# Patient Record
Sex: Female | Born: 1935
Health system: Southern US, Community
[De-identification: ages and names within clinical notes are randomized; demographics above are authoritative.]

## PROBLEM LIST (undated history)

## (undated) DIAGNOSIS — E039 Hypothyroidism, unspecified: Secondary | ICD-10-CM

## (undated) DIAGNOSIS — E785 Hyperlipidemia, unspecified: Secondary | ICD-10-CM

## (undated) DIAGNOSIS — N809 Endometriosis, unspecified: Secondary | ICD-10-CM

## (undated) DIAGNOSIS — K219 Gastro-esophageal reflux disease without esophagitis: Secondary | ICD-10-CM

## (undated) DIAGNOSIS — I10 Essential (primary) hypertension: Secondary | ICD-10-CM

## (undated) DIAGNOSIS — M1990 Unspecified osteoarthritis, unspecified site: Secondary | ICD-10-CM

## (undated) DIAGNOSIS — K635 Polyp of colon: Secondary | ICD-10-CM

## (undated) DIAGNOSIS — F419 Anxiety disorder, unspecified: Secondary | ICD-10-CM

## (undated) DIAGNOSIS — K5790 Diverticulosis of intestine, part unspecified, without perforation or abscess without bleeding: Secondary | ICD-10-CM

## (undated) DIAGNOSIS — T7840XA Allergy, unspecified, initial encounter: Secondary | ICD-10-CM

## (undated) DIAGNOSIS — M353 Polymyalgia rheumatica: Secondary | ICD-10-CM

## (undated) DIAGNOSIS — N6019 Diffuse cystic mastopathy of unspecified breast: Secondary | ICD-10-CM

## (undated) DIAGNOSIS — K589 Irritable bowel syndrome without diarrhea: Secondary | ICD-10-CM

## (undated) HISTORY — PX: ABDOMINAL HYSTERECTOMY: SHX81

## (undated) HISTORY — DX: Diverticulosis of intestine, part unspecified, without perforation or abscess without bleeding: K57.90

## (undated) HISTORY — PX: BREAST BIOPSY: SHX20

## (undated) HISTORY — DX: Hyperlipidemia, unspecified: E78.5

## (undated) HISTORY — PX: CATARACT EXTRACTION, BILATERAL: SHX1313

## (undated) HISTORY — DX: Hypothyroidism, unspecified: E03.9

## (undated) HISTORY — PX: LEFT COLECTOMY: SHX856

## (undated) HISTORY — DX: Allergy, unspecified, initial encounter: T78.40XA

## (undated) HISTORY — DX: Irritable bowel syndrome, unspecified: K58.9

## (undated) HISTORY — DX: Polyp of colon: K63.5

## (undated) HISTORY — DX: Essential (primary) hypertension: I10

## (undated) HISTORY — DX: Endometriosis, unspecified: N80.9

## (undated) HISTORY — DX: Gastro-esophageal reflux disease without esophagitis: K21.9

## (undated) HISTORY — DX: Anxiety disorder, unspecified: F41.9

## (undated) HISTORY — DX: Diffuse cystic mastopathy of unspecified breast: N60.19

## (undated) HISTORY — DX: Polymyalgia rheumatica: M35.3

## (undated) HISTORY — PX: APPENDECTOMY: SHX54

---

## 2000-06-18 ENCOUNTER — Encounter: Payer: Self-pay | Admitting: Internal Medicine

## 2000-06-18 ENCOUNTER — Encounter: Admission: RE | Admit: 2000-06-18 | Discharge: 2000-06-18 | Payer: Self-pay | Admitting: Internal Medicine

## 2000-06-28 ENCOUNTER — Encounter: Payer: Self-pay | Admitting: Internal Medicine

## 2000-06-28 ENCOUNTER — Encounter: Admission: RE | Admit: 2000-06-28 | Discharge: 2000-06-28 | Payer: Self-pay | Admitting: Internal Medicine

## 2000-07-03 ENCOUNTER — Encounter: Payer: Self-pay | Admitting: Internal Medicine

## 2000-07-03 ENCOUNTER — Encounter: Admission: RE | Admit: 2000-07-03 | Discharge: 2000-07-03 | Payer: Self-pay | Admitting: Internal Medicine

## 2000-07-09 ENCOUNTER — Encounter: Admission: RE | Admit: 2000-07-09 | Discharge: 2000-07-09 | Payer: Self-pay | Admitting: Internal Medicine

## 2000-07-09 ENCOUNTER — Encounter: Payer: Self-pay | Admitting: Internal Medicine

## 2000-07-24 ENCOUNTER — Other Ambulatory Visit: Admission: RE | Admit: 2000-07-24 | Discharge: 2000-07-24 | Payer: Self-pay | Admitting: Internal Medicine

## 2001-08-27 ENCOUNTER — Encounter: Admission: RE | Admit: 2001-08-27 | Discharge: 2001-08-27 | Payer: Self-pay | Admitting: Internal Medicine

## 2001-08-27 ENCOUNTER — Encounter: Payer: Self-pay | Admitting: Internal Medicine

## 2003-08-18 ENCOUNTER — Other Ambulatory Visit: Admission: RE | Admit: 2003-08-18 | Discharge: 2003-08-18 | Payer: Self-pay | Admitting: Internal Medicine

## 2003-12-08 ENCOUNTER — Encounter: Admission: RE | Admit: 2003-12-08 | Discharge: 2003-12-08 | Payer: Self-pay | Admitting: Internal Medicine

## 2004-08-30 ENCOUNTER — Encounter: Admission: RE | Admit: 2004-08-30 | Discharge: 2004-08-30 | Payer: Self-pay | Admitting: Internal Medicine

## 2005-06-21 ENCOUNTER — Encounter: Admission: RE | Admit: 2005-06-21 | Discharge: 2005-06-21 | Payer: Self-pay | Admitting: Internal Medicine

## 2005-11-07 ENCOUNTER — Other Ambulatory Visit: Admission: RE | Admit: 2005-11-07 | Discharge: 2005-11-07 | Payer: Self-pay | Admitting: Internal Medicine

## 2005-12-11 ENCOUNTER — Encounter: Admission: RE | Admit: 2005-12-11 | Discharge: 2005-12-11 | Payer: Self-pay | Admitting: Internal Medicine

## 2006-09-05 ENCOUNTER — Encounter: Admission: RE | Admit: 2006-09-05 | Discharge: 2006-09-05 | Payer: Self-pay | Admitting: Internal Medicine

## 2007-10-11 ENCOUNTER — Encounter: Admission: RE | Admit: 2007-10-11 | Discharge: 2007-10-11 | Payer: Self-pay | Admitting: Internal Medicine

## 2008-12-07 ENCOUNTER — Ambulatory Visit: Payer: Self-pay | Admitting: Internal Medicine

## 2009-01-19 ENCOUNTER — Ambulatory Visit: Payer: Self-pay | Admitting: Internal Medicine

## 2009-10-06 ENCOUNTER — Encounter: Admission: RE | Admit: 2009-10-06 | Discharge: 2009-10-06 | Payer: Self-pay | Admitting: Internal Medicine

## 2009-12-16 ENCOUNTER — Encounter (INDEPENDENT_AMBULATORY_CARE_PROVIDER_SITE_OTHER): Payer: Self-pay | Admitting: *Deleted

## 2010-01-03 ENCOUNTER — Encounter (INDEPENDENT_AMBULATORY_CARE_PROVIDER_SITE_OTHER): Payer: Self-pay

## 2010-01-04 ENCOUNTER — Ambulatory Visit: Payer: Self-pay | Admitting: Internal Medicine

## 2010-01-18 ENCOUNTER — Ambulatory Visit: Payer: Self-pay | Admitting: Internal Medicine

## 2010-01-20 ENCOUNTER — Encounter: Payer: Self-pay | Admitting: Internal Medicine

## 2010-02-08 ENCOUNTER — Ambulatory Visit: Payer: Self-pay | Admitting: Internal Medicine

## 2010-10-31 ENCOUNTER — Other Ambulatory Visit: Payer: Self-pay | Admitting: Internal Medicine

## 2010-10-31 DIAGNOSIS — Z1239 Encounter for other screening for malignant neoplasm of breast: Secondary | ICD-10-CM

## 2010-11-01 NOTE — Letter (Signed)
Summary: Previsit letter  [facility]  [address] [place], [place] [address]   Phone: [phone]  Fax: [phone]       12/16/2009 MRN: [id number]  [facility] [address] [place], [place]  [address]  Dear Ms. Storey,  Welcome to the Gastroenterology Division at [facility].    You are scheduled to see a nurse for your pre-procedure visit on January 04, 2010 at 11:00am on the 3rd floor at [facility], [address]  We ask that you try to arrive at our office 15 minutes prior to your appointment time to allow for check-in.  Your nurse visit will consist of discussing your medical and surgical history, your immediate family medical history, and your medications.    Please bring a complete list of all your medications or, if you prefer, bring the medication bottles and we will list them.  We will need to be aware of both prescribed and over the counter drugs.  We will need to know exact dosage information as well.  If you are on blood thinners (Coumadin, Plavix, Aggrenox, Ticlid, etc.) please call our office today/prior to your appointment, as we need to consult with your physician about holding your medication.   Please be prepared to read and sign documents such as consent forms, a financial agreement, and acknowledgement forms.  If necessary, and with your consent, a friend or relative is welcome to sit-in on the nurse visit with you.  Please bring your insurance card so that we may make a copy of it.  If your insurance requires a referral to see a specialist, please bring your referral form from your primary care physician.  No co-pay is required for this nurse visit.     If you cannot keep your appointment, please call [phone] to cancel or reschedule prior to your appointment date.  This allows [place] the opportunity to schedule an appointment for another patient in need of care.    Thank you for choosing [facility] for your medical  needs.  We appreciate the opportunity to care for you.  Please visit [place] at our website  to learn more about our practice.                     Sincerely.                                                                                                                   The Gastroenterology Division

## 2010-11-01 NOTE — Procedures (Signed)
Summary: Colonoscopy  Patient: Dominique Livingston Note: All result statuses are Final unless otherwise noted.  Tests: (1) Colonoscopy (COL)   COL Colonoscopy           DONE     [facility]     [address]     [place], [place]  [address]           COLONOSCOPY PROCEDURE REPORT           PATIENT:  Dominique, Livingston  MR#:  [id number]     BIRTHDATE:  [birth year], 76 yrs. old  GENDER:  female     ENDOSCOPIST:  Hedwig Morton. Juanda Chance, MD     REF. BY:  Sharlet Salina, M.D.     PROCEDURE DATE:  01/18/2010     PROCEDURE:  Colonoscopy 19147     ASA CLASS:  Class I     INDICATIONS:  Routine Risk Screening sigmoid resection 30 years     ago for severe endometriosis     MEDICATIONS:   Versed 7 mg, Fentanyl 50 mcg           DESCRIPTION OF PROCEDURE:   After the risks benefits and     alternatives of the procedure were thoroughly explained, informed     consent was obtained.  Digital rectal exam was performed and     revealed no rectal masses.   The LB CF-[id number] endoscope     was introduced through the anus and advanced to the cecum, which     was identified by both the appendix and ileocecal valve, without     limitations.  The quality of the prep was good, using MiraLax.     The instrument was then slowly withdrawn as the colon was fully     examined.     <<PROCEDUREIMAGES>>           FINDINGS:  Three polyps were found in the sigmoid colon. at 20 cm     3 diminutive polyps Polyp was snared without cautery. Retrieval     was successful. snare polyp The polyps were removed using cold     biopsy forceps (see image4, image5, image6, image7, and image8).     Mild diverticulosis was found in the sigmoid colon.  This was     otherwise a normal examination of the colon (see image1, image2,     image3, and image9).   Retroflexed views in the rectum revealed no     abnormalities.    The scope was then withdrawn from the patient     and the procedure completed.           COMPLICATIONS:   None     ENDOSCOPIC IMPRESSION:     1) Three polyps in the sigmoid colon     2) Mild diverticulosis in the sigmoid colon     3) Otherwise normal examination     RECOMMENDATIONS:     1) Await pathology results     2) High fiber diet.     REPEAT EXAM:  In 5 - 7 year(s) for.           ______________________________     Hedwig Morton. Juanda Chance, MD           CC:           n.     eSIGNED:   Hedwig Morton. Brodie at 01/18/2010 10:55 AM           Aileen Pilot, [id number]  Note: An  exclamation mark (!) indicates a result that was not dispersed into the flowsheet. Document Creation Date: 01/18/2010 10:56 AM _______________________________________________________________________  (1) Order result status: Final Collection or observation date-time: 01/18/2010 10:45 Requested date-time:  Receipt date-time:  Reported date-time:  Referring Physician:   Ordering Physician: Lina Sar [phone]) Specimen Source:  Source: Launa Grill Order Number: [id number] Lab site:   Appended Document: Colonoscopy     Procedures Next Due Date:    Colonoscopy: 01/2020

## 2010-11-01 NOTE — Miscellaneous (Signed)
Summary: Lec previsit  Clinical Lists Changes  Medications: Added new medication of MIRALAX   POWD (POLYETHYLENE GLYCOL 3350) As per prep  instructions. - Signed Added new medication of REGLAN 10 MG  TABS (METOCLOPRAMIDE HCL) As per prep instructions. - Signed Added new medication of DULCOLAX 5 MG  TBEC (BISACODYL) Day before procedure take 2 at 3pm and 2 at 8pm. - Signed Rx of MIRALAX   POWD (POLYETHYLENE GLYCOL 3350) As per prep  instructions.;  #255gm x 0;  Signed;  Entered by: Ulis Rias RN;  Authorized by: Hart Carwin MD;  Method used: Electronically to [facility] [address] [place], [place], [place]  [address], Ph: [phone], Fax: [phone] Rx of REGLAN 10 MG  TABS (METOCLOPRAMIDE HCL) As per prep instructions.;  #2 x 0;  Signed;  Entered by: Ulis Rias RN;  Authorized by: Hart Carwin MD;  Method used: Electronically to [facility] [address], [place], [place], [place]  [address], Ph: [phone], Fax: [phone] Rx of DULCOLAX 5 MG  TBEC (BISACODYL) Day before procedure take 2 at 3pm and 2 at 8pm.;  #4 x 0;  Signed;  Entered by: Ulis Rias RN;  Authorized by: Hart Carwin MD;  Method used: Electronically to [facility] [address] [place], [place], [place]  [address], Ph: [phone], Fax: [phone] Allergies: Added new allergy or adverse reaction of * ERTHROMYCIN Observations: Added new observation of NKA: F (01/04/2010 10:36)    Prescriptions: DULCOLAX 5 MG  TBEC (BISACODYL) Day before procedure take 2 at 3pm and 2 at 8pm.  #4 x 0   Entered by:   Ulis Rias RN   Authorized by:   Hart Carwin MD   Signed by:   Ulis Rias RN on 01/04/2010   Method used:   Electronically to        [facility] (retail)       [address]       [place], [place]  [address]       Ph: [phone]       Fax: [phone]   RxID:   [id number] REGLAN 10 MG  TABS (METOCLOPRAMIDE HCL) As  per prep instructions.  #2 x 0   Entered by:   Ulis Rias RN   Authorized by:   Hart Carwin MD   Signed by:   Ulis Rias RN on 01/04/2010   Method used:   Electronically to        [facility] (retail)       [address]       [place], [place]  [address]       Ph: [phone]       Fax: [phone]   RxID:   [id number] MIRALAX   POWD (POLYETHYLENE GLYCOL 3350) As per prep  instructions.  #255gm x 0   Entered by:   Ulis Rias RN   Authorized by:   Hart Carwin MD   Signed by:   Ulis Rias RN on 01/04/2010   Method used:   Electronically to        [facility] (retail)       [address]       [place], [place]  [address]       Ph:  [phone]       Fax: [phone]   RxID:   [id number]

## 2010-11-01 NOTE — Letter (Signed)
Summary: Patient Notice- Polyp Results  [facility]  [address] [place], [place] [address]   Phone: [phone]  Fax: [phone]        January 20, 2010 MRN: [id number]    [facility] [address] [place], [place]  [address]    Dear Ms. Hor,  I am pleased to inform you that the colon polyp(s) removed during your recent colonoscopy was (were) found to be benign (no cancer detected) upon pathologic examination.The polyps were hyperplastic, ( not precancerous)  I recommend you have a repeat colonoscopy examination in  10_ years to look for recurrent polyps, as having colon polyps increases your risk for having recurrent polyps or even colon cancer in the future.  Should you develop new or worsening symptoms of abdominal pain, bowel habit changes or bleeding from the rectum or bowels, please schedule an evaluation with either your primary care physician or with me.  Additional information/recommendations:  _x_ No further action with gastroenterology is needed at this time. Please      follow-up with your primary care physician for your other healthcare      needs.  __ Please call [phone] to schedule a return visit to review your      situation.  __ Please keep your follow-up visit as already scheduled.  __ Continue treatment plan as outlined the day of your exam.  Please call us if you are having persistent problems or have questions about your condition that have not been fully answered at this time.  Sincerely,  Hart Carwin MD  This letter has been electronically signed by your physician.  Appended Document: Patient Notice- Polyp Results letter mailed 4.25.11

## 2010-11-01 NOTE — Letter (Signed)
Summary: [facility] Instructions  [facility]  [address] [place], [place] [address]   Phone: [phone]  Fax: [phone]       Dominique Livingston    1933/04/09    MRN: [id number]       Procedure Day /Date: 01/18/10  Tuesday     Arrival Time:  9:00am     Procedure Time: 10:00am     Location of Procedure:                    _x _  [facility] (4th Floor)   PREPARATION FOR COLONOSCOPY WITH MIRALAX  Starting 5 days prior to your procedure _5:00am _ do not eat nuts, seeds, popcorn, corn, beans, peas,  salads, or any raw vegetables.  Do not take any fiber supplements (e.g. Metamucil, Citrucel, and Benefiber). ____________________________________________________________________________________________________   THE DAY BEFORE YOUR PROCEDURE         DATE:   01/17/10 DAY:  Monday  1   Drink clear liquids the entire day-NO SOLID FOOD  2   Do not drink anything colored red or purple.  Avoid juices with pulp.  No orange juice.  3   Drink at least 64 oz. (8 glasses) of fluid/clear liquids during the day to prevent dehydration and help the prep work efficiently.  CLEAR LIQUIDS INCLUDE: Water Jello Ice Popsicles Tea (sugar ok, no milk/cream) Powdered fruit flavored drinks Coffee (sugar ok, no milk/cream) Gatorade Juice: apple, white grape, white cranberry  Lemonade Clear bullion, consomm, broth Carbonated beverages (any kind) Strained chicken noodle soup Hard Candy  4   Mix the entire bottle of Miralax with 64 oz. of Gatorade/Powerade in the morning and put in the refrigerator to chill.  5   At 3:00 pm take 2 Dulcolax/Bisacodyl tablets.  6   At 4:30 pm take one Reglan/Metoclopramide tablet.  7  Starting at 5:00 pm drink one 8 oz glass of the Miralax mixture every 15-20 minutes until you have finished drinking the entire 64 oz.  You should finish drinking prep around 7:30 or 8:00 pm.  8   If you are nauseated, you may take the 2nd Reglan/Metoclopramide  tablet at 6:30 pm.        9    At 8:00 pm take 2 more DULCOLAX/Bisacodyl tablets.     THE DAY OF YOUR PROCEDURE      DATE:   01/18/10  DAY:  Tuesday  You may drink clear liquids until   8:00am  (2 HOURS BEFORE PROCEDURE).   MEDICATION INSTRUCTIONS  Unless otherwise instructed, you should take regular prescription medications with a small sip of water as early as possible the morning of your procedure.         OTHER INSTRUCTIONS  You will need a responsible adult at least 75 years of age to accompany you and drive you home.   This person must remain in the waiting room during your procedure.  Wear loose fitting clothing that is easily removed.  Leave jewelry and other valuables at home.  However, you may wish to bring a book to read or an iPod/MP3 player to listen to music as you wait for your procedure to start.  Remove all body piercing jewelry and leave at home.  Total time from sign-in until discharge is approximately 2-3 hours.  You should go home directly after your procedure and rest.  You can resume normal activities the day after your procedure.  The day of your procedure you should  not:   Drive   Make legal decisions   Operate machinery   Drink alcohol   Return to work  You will receive specific instructions about eating, activities and medications before you leave.   The above instructions have been reviewed and explained to me by   Ulis Rias RN  January 04, 2010 11:30 AM     I fully understand and can verbalize these instructions _____________________________ Date _______

## 2010-11-03 ENCOUNTER — Ambulatory Visit
Admission: RE | Admit: 2010-11-03 | Discharge: 2010-11-03 | Disposition: A | Discharge status: Home / Self Care | Payer: Medicare Other | Source: Ambulatory Visit | Attending: Internal Medicine | Admitting: Internal Medicine

## 2010-11-03 DIAGNOSIS — Z1239 Encounter for other screening for malignant neoplasm of breast: Secondary | ICD-10-CM

## 2011-02-06 ENCOUNTER — Encounter: Payer: Medicare Other | Admitting: Internal Medicine

## 2011-02-06 DIAGNOSIS — I10 Essential (primary) hypertension: Secondary | ICD-10-CM

## 2011-02-06 DIAGNOSIS — F411 Generalized anxiety disorder: Secondary | ICD-10-CM

## 2011-02-06 DIAGNOSIS — E039 Hypothyroidism, unspecified: Secondary | ICD-10-CM

## 2011-02-06 DIAGNOSIS — E785 Hyperlipidemia, unspecified: Secondary | ICD-10-CM

## 2011-10-30 ENCOUNTER — Other Ambulatory Visit: Payer: Self-pay | Admitting: Internal Medicine

## 2012-01-08 ENCOUNTER — Other Ambulatory Visit: Payer: Self-pay | Admitting: Internal Medicine

## 2012-01-08 DIAGNOSIS — Z1231 Encounter for screening mammogram for malignant neoplasm of breast: Secondary | ICD-10-CM

## 2012-01-11 ENCOUNTER — Ambulatory Visit
Admission: RE | Admit: 2012-01-11 | Discharge: 2012-01-11 | Disposition: A | Discharge status: Home / Self Care | Payer: Medicare Other | Source: Ambulatory Visit | Attending: Internal Medicine | Admitting: Internal Medicine

## 2012-01-11 DIAGNOSIS — Z1231 Encounter for screening mammogram for malignant neoplasm of breast: Secondary | ICD-10-CM | POA: Diagnosis not present

## 2012-02-13 ENCOUNTER — Other Ambulatory Visit: Payer: Self-pay | Admitting: Internal Medicine

## 2012-02-13 NOTE — Telephone Encounter (Signed)
?  time for OV?

## 2012-03-08 ENCOUNTER — Encounter: Payer: Self-pay | Admitting: Internal Medicine

## 2012-03-08 ENCOUNTER — Ambulatory Visit (INDEPENDENT_AMBULATORY_CARE_PROVIDER_SITE_OTHER): Payer: Medicare Other | Admitting: Internal Medicine

## 2012-03-08 VITALS — BP 136/76 | HR 76 | Temp 97.7°F | Ht 62.25 in | Wt 146.0 lb

## 2012-03-08 DIAGNOSIS — I10 Essential (primary) hypertension: Secondary | ICD-10-CM | POA: Insufficient documentation

## 2012-03-08 DIAGNOSIS — Z780 Asymptomatic menopausal state: Secondary | ICD-10-CM

## 2012-03-08 DIAGNOSIS — Z23 Encounter for immunization: Secondary | ICD-10-CM

## 2012-03-08 DIAGNOSIS — M810 Age-related osteoporosis without current pathological fracture: Secondary | ICD-10-CM | POA: Diagnosis not present

## 2012-03-08 DIAGNOSIS — E039 Hypothyroidism, unspecified: Secondary | ICD-10-CM | POA: Diagnosis not present

## 2012-03-08 DIAGNOSIS — M8580 Other specified disorders of bone density and structure, unspecified site: Secondary | ICD-10-CM

## 2012-03-08 DIAGNOSIS — Z0000 Encounter for general adult medical examination without abnormal findings: Secondary | ICD-10-CM

## 2012-03-08 DIAGNOSIS — M353 Polymyalgia rheumatica: Secondary | ICD-10-CM

## 2012-03-08 DIAGNOSIS — E785 Hyperlipidemia, unspecified: Secondary | ICD-10-CM | POA: Diagnosis not present

## 2012-03-08 DIAGNOSIS — M949 Disorder of cartilage, unspecified: Secondary | ICD-10-CM

## 2012-03-08 LAB — LIPID PANEL
Cholesterol: 219 mg/dL — ABNORMAL HIGH (ref 0–200)
HDL: 58 mg/dL (ref >39)
Total CHOL/HDL Ratio: 3.8 Ratio
Triglycerides: 188 mg/dL — ABNORMAL HIGH (ref <150)
VLDL: 38 mg/dL (ref 0–40)

## 2012-03-08 LAB — COMPREHENSIVE METABOLIC PANEL
Albumin: 4.8 g/dL (ref 3.5–5.2)
BUN: 18 mg/dL (ref 6–23)
Chloride: 102 mEq/L (ref 96–112)
Glucose, Bld: 90 mg/dL (ref 70–99)
Potassium: 4.1 mEq/L (ref 3.5–5.3)
Total Protein: 7.6 g/dL (ref 6.0–8.3)

## 2012-03-08 LAB — CBC WITH DIFFERENTIAL/PLATELET
Basophils Relative: 1 % (ref 0–1)
Eosinophils Absolute: 0.1 10*3/uL (ref 0.0–0.7)
Eosinophils Relative: 2 % (ref 0–5)
Lymphocytes Relative: 31 % (ref 12–46)
MCHC: 33.2 g/dL (ref 30.0–36.0)
MCV: 88.1 fL (ref 78.0–100.0)
Neutro Abs: 3.4 10*3/uL (ref 1.7–7.7)
RDW: 13.8 % (ref 11.5–15.5)
WBC: 5.6 10*3/uL (ref 4.0–10.5)

## 2012-03-08 MED ORDER — LISINOPRIL 5 MG PO TABS: 5.0000 mg | ORAL_TABLET | Freq: Every day | ORAL | Status: DC

## 2012-03-08 MED ORDER — EZETIMIBE 10 MG PO TABS: 10.0000 mg | ORAL_TABLET | Freq: Every day | ORAL | Status: DC

## 2012-03-08 MED ORDER — LEVOTHYROXINE SODIUM 75 MCG PO TABS: 75.0000 ug | ORAL_TABLET | Freq: Every day | ORAL | Status: DC

## 2012-03-08 NOTE — Patient Instructions (Signed)
Continue same medications.  Return in one year.

## 2012-03-08 NOTE — Progress Notes (Signed)
Subjective:    [name], female    DOB: 07/28/1933, 78 y.o.   MRN: [id number]  HPI 78 year old white female with history of allergic rhinitis, hypothyroidism diagnosed 1995, hyperlipidemia diagnosed 1998, anxiety, polymyalgia rheumatica diagnosed in 2001, GE reflux, hypertension diagnosed in 2001, osteopenia, fibrocystic breast disease in today for health maintenance and evaluation of multiple medical problems. She is intolerant of Fosamax it caused an adverse reaction. Augmentin causes nausea.  Had Zostavax vaccine may 2011, colonoscopy April 2011. Takes calcium and vitamin D supplementation. Could not tolerate statin medication but takes city of for hyperlipidemia. Has been followed by rheumatologist for polymyalgia rheumatica which is improved over the years. For some time she took prednisone but this is been discontinued.  2 surgeries for endometriosis in the remote past. One surgery for endometriosis involved left salpingo-oophorectomy. At that time there was obstruction of the sigmoid colon which resulted in a left colectomy performed by Dr. Rolene Course in 1969. Patient subsequently had a hysterectomy with a right salpingo-oophorectomy performed by Dr. Gery Pray. She also had an ovarian cyst drained in the 1960s. Had an excisional biopsy of the right breast revealing fibrocystic breast disease April 1979. Cataract extraction February 2006 of the right eye and in April 2006 of the left eye.  Family history: Father died at age 27 with history of pneumonia, COPD and heart problems. Mother died at age 80 of cancer that was metastatic but primary was unknown. 2 brothers with history of hypertension. 2 adult children a son and a daughter. Daughter has hypertension.  Social history: Nonsmoker does not consume alcohol. Husband is retired Company secretary. She is a Futures trader. Husband has had some medical issues that have been stressful.    Review of Systems  Constitutional: Negative.   HENT:  Negative.   Eyes:       Vision not as good as it used to be  Respiratory: Negative.   Cardiovascular: Negative.   Genitourinary: Negative.   Musculoskeletal: Positive for arthralgias.  Neurological: Negative.   Hematological: Negative.   Psychiatric/Behavioral: Positive for dysphoric mood.       Objective:   Physical Exam  Vitals reviewed. Constitutional: She is oriented to person, place, and time. She appears well-developed and well-nourished. No distress.  HENT:  Head: Normocephalic and atraumatic.  Right Ear: External ear normal.  Left Ear: External ear normal.  Nose: Nose normal.  Mouth/Throat: Oropharynx is clear and moist. No oropharyngeal exudate.  Eyes: Conjunctivae and EOM are normal. Pupils are equal, round, and reactive to light. Right eye exhibits no discharge. Left eye exhibits no discharge. No scleral icterus.  Neck: Normal range of motion. Neck supple. No JVD present. No thyromegaly present.  Cardiovascular: Normal rate, regular rhythm, normal heart sounds and intact distal pulses.   No murmur heard. Pulmonary/Chest: Effort normal and breath sounds normal. No respiratory distress. She has no wheezes. She has no rales. She exhibits no tenderness.       Breasts normal female  Abdominal: Soft. Bowel sounds are normal. She exhibits no distension and no mass. There is no tenderness. There is no rebound and no guarding.  Genitourinary:       deferrred  Musculoskeletal: Normal range of motion. She exhibits no edema and no tenderness.  Lymphadenopathy:    She has no cervical adenopathy.  Neurological: She is alert and oriented to person, place, and time. She has normal reflexes. She displays normal reflexes. No cranial nerve deficit.  Skin: Skin is warm and dry. No rash noted.  She is not diaphoretic.  Psychiatric: She has a normal mood and affect. Her behavior is normal. Judgment and thought content normal.   Subjective:   Patient presents for Medicare Annual/Subsequent  preventive examination.   Review Past Medical/Family/Social: See above   Risk Factors  Current exercise habits: Sedentary Dietary issues discussed: Low-fat low-carb  Cardiac risk factors: Hyperlipidemia and hypertension  Depression Screen  (Note: if answer to either of the following is "Yes", a more complete depression screening is indicated)  Over the past two weeks, have you felt down, depressed or hopeless? Down but not hopeless Over the past two weeks, have you felt little interest or pleasure in doing things? Yes  Have you lost interest or pleasure in daily life? Yes  Do you often feel hopeless? Yes  Do you cry easily over simple problems? No   Activities of Daily Living  In your present state of health, do you have any difficulty performing the following activities?:  Driving? No  Managing money? No  Feeding yourself? No  Getting from bed to chair? No  Climbing a flight of stairs? No  Preparing food and eating?: No  Bathing or showering? No  Getting dressed: No  Getting to the toilet? No  Using the toilet:No  Moving around from place to place: No  In the past year have you fallen or had a near fall?:No  Are you sexually active? No  Do you have more than one partner? No   Hearing Difficulties: No  Do you often ask people to speak up or repeat themselves? No  Do you experience ringing or noises in your ears? Yes  Do you have difficulty understanding soft or whispered voices? No  Do you feel that you have a problem with memory? Yes  Do you often misplace items? No  Do you feel safe at home? Yes   Cognitive Testing  Alert? Yes Normal Appearance?Yes  Oriented to person? Yes Place? Yes  Time? Yes  Recall of three objects? Yes  Can perform simple calculations? Yes  Displays appropriate judgment?Yes  Can read the correct time from a watch face?Yes   List the Names of Other Physician/Practitioners you currently use: Rheumatologist   Indicate any recent Medical  Services you may have received from other than [facility] providers in the past year (date may be approximate).   Screening Tests / Date Colonoscopy April 2011                    Zostavax May 2011 Mammogram January 2011 Influenza Vaccine yearly Tetanus/tdap 03/08/2012    Objective:       General appearance: Appears stated age  Head: Normocephalic, without obvious abnormality, atraumatic  Eyes: conj clear, EOMi PEERLA  Ears: normal TM's and external ear canals both ears  Nose: Nares normal. Septum midline. Mucosa normal. No drainage or sinus tenderness.  Throat: lips, mucosa, and tongue normal; teeth and gums normal  Neck: no adenopathy, no carotid bruit, no JVD, supple, symmetrical, trachea midline and thyroid not enlarged, symmetric, no tenderness/mass/nodules  No CVA tenderness.  Lungs: clear to auscultation bilaterally  Breasts: normal appearance, no masses or tenderness,  Heart: regular rate and rhythm, S1, S2 normal, no murmur, click, rub or gallop  Abdomen: soft, non-tender; bowel sounds normal; no masses, no organomegaly  Musculoskeletal: ROM normal in all joints, no crepitus, no deformity, Normal muscle strengthen. Back  is symmetric, no curvature. Skin: Skin color, texture, turgor normal. No rashes or lesions  Lymph nodes: Cervical,  supraclavicular, and axillary nodes normal.  Neurologic: CN 2 -12 Normal, Normal symmetric reflexes. Normal coordination and gait  Psych: Alert & Oriented x 3, Mood appear stable.    Assessment:    Annual wellness medicare exam   Plan:    During the course of the visit the patient was educated and counseled about appropriate screening and preventive services including:  Screening mammography  Colorectal cancer screening  Shingles vaccine. Prescription given to that she can get the vaccine at the pharmacy or Medicare part D.  Screen + for depression. PHQ- 9 score of 12 (moderate depression). We discussed the options of counseling versus  possibly a medication. I encouraged her strongly think about the counseling. She is going through some medical problems currently and her husband is as well Mrs. been very stressful for her. She says she will think about it. She does have Xanax to use as needed. Though she may benefit from an SSRI for her more depressive type symptoms but she wants to hold off at this time.  I aksed her to please have her cardioloist send records since we have none on file.  Diet review for nutrition referral? Yes ____ Not Indicated __x__  Patient Instructions (the written plan) was given to the patient.  Medicare Attestation  I have personally reviewed:  The patient's medical and social history  Their use of alcohol, tobacco or illicit drugs  Their current medications and supplements  The patient's functional ability including ADLs,fall risks, home safety risks, cognitive, and hearing and visual impairment  Diet and physical activities  Evidence for depression or mood disorders  The patient's weight, height, BMI, and visual acuity have been recorded in the chart. I have made referrals, counseling, and provided education to the patient based on review of the above and I have provided the patient with a written personalized care plan for preventive services.            Assessment & Plan:  Hypertension  Hyperlipidemia  Osteopenia  History of polymyalgia rheumatica  Hypothyroidism  Allergic rhinitis  Fibrocystic breast disease  Anxiety  Plan: Recommend annual mammogram. Rheumatologist usually has patient get bone density study. Colonoscopy done April 2011. Immunizations are now up-to-date. Return in 6 months for office visit, lipid panel on Zetia, TSH and blood pressure check. No change in medications  Spent 25 minutes reviewing medical problems with patient in talking with her about her current situation, life stress with anxiety over husband's medical problems. Refills given.

## 2012-03-09 LAB — VITAMIN D 25 HYDROXY (VIT D DEFICIENCY, FRACTURES): Vit D, 25-Hydroxy: 46 ng/mL (ref 30–89)

## 2013-02-06 ENCOUNTER — Encounter: Payer: Self-pay | Admitting: Internal Medicine

## 2013-02-06 ENCOUNTER — Ambulatory Visit (INDEPENDENT_AMBULATORY_CARE_PROVIDER_SITE_OTHER): Payer: Medicare Other | Admitting: Internal Medicine

## 2013-02-06 VITALS — BP 148/78 | HR 80 | Temp 99.6°F | Wt 151.0 lb

## 2013-02-06 DIAGNOSIS — N390 Urinary tract infection, site not specified: Secondary | ICD-10-CM

## 2013-02-06 DIAGNOSIS — R300 Dysuria: Secondary | ICD-10-CM

## 2013-02-06 LAB — POCT URINALYSIS DIPSTICK: Bilirubin, UA: NEGATIVE

## 2013-02-06 NOTE — Progress Notes (Signed)
  Subjective:    [name], female    DOB: 1933/05/07, 79 y.o.   MRN: [id number]  HPI  Onset this past weekend of urinary symptoms of dysuria and pressure. No occult blood in urine. Some chills for 4 hours 2 days ago. Urine has been cloudy and has odor. No documented fever. No nausea and vomiting. Patient is worried that she could have contracted this infection from her husband. He apparently has had a recent urinary infection. Explained to her that this was highly unlikely.    Review of Systems     Objective:   Physical Exam no CVA tenderness. Dipstick UA is abnormal. See results. Culture taken.        Assessment & Plan:  Urinary tract infection  Plan: Cipro  twice daily for 7 days   Addendum: Urine culture greater than 100,000col/ml Proteus mirabilis sensitive to Cipro

## 2013-02-09 LAB — URINE CULTURE

## 2013-02-10 NOTE — Progress Notes (Signed)
Patient informed. 

## 2013-02-19 ENCOUNTER — Other Ambulatory Visit: Payer: Self-pay

## 2013-02-19 DIAGNOSIS — Z1231 Encounter for screening mammogram for malignant neoplasm of breast: Secondary | ICD-10-CM

## 2013-02-20 ENCOUNTER — Other Ambulatory Visit (INDEPENDENT_AMBULATORY_CARE_PROVIDER_SITE_OTHER): Payer: Medicare Other | Admitting: Internal Medicine

## 2013-02-20 VITALS — Temp 98.7°F

## 2013-02-20 DIAGNOSIS — N390 Urinary tract infection, site not specified: Secondary | ICD-10-CM | POA: Diagnosis not present

## 2013-02-20 LAB — POCT URINALYSIS DIPSTICK
Bilirubin, UA: NEGATIVE
Blood, UA: NEGATIVE
Glucose, UA: NEGATIVE
Leukocytes, UA: NEGATIVE
Protein, UA: NEGATIVE
Spec Grav, UA: 1.01
Urobilinogen, UA: NEGATIVE

## 2013-02-20 NOTE — Progress Notes (Signed)
Patient presents today for a recheck of her UTI after finishing antibiotics. She is asymptomatic. Urine is clear.

## 2013-02-27 ENCOUNTER — Ambulatory Visit
Admission: RE | Admit: 2013-02-27 | Discharge: 2013-02-27 | Disposition: A | Discharge status: Home / Self Care | Payer: Medicare Other | Source: Ambulatory Visit

## 2013-02-27 DIAGNOSIS — Z1231 Encounter for screening mammogram for malignant neoplasm of breast: Secondary | ICD-10-CM

## 2013-03-02 NOTE — Patient Instructions (Addendum)
Take Cipro twice daily as directed. Return in 2 weeks for repeat urinalysis.

## 2013-03-11 ENCOUNTER — Ambulatory Visit (INDEPENDENT_AMBULATORY_CARE_PROVIDER_SITE_OTHER): Payer: Medicare Other | Admitting: Internal Medicine

## 2013-03-11 ENCOUNTER — Other Ambulatory Visit: Payer: Self-pay

## 2013-03-11 ENCOUNTER — Other Ambulatory Visit: Payer: Self-pay | Admitting: Internal Medicine

## 2013-03-11 ENCOUNTER — Encounter: Payer: Self-pay | Admitting: Internal Medicine

## 2013-03-11 VITALS — BP 150/70 | HR 72 | Temp 98.1°F | Wt 150.0 lb

## 2013-03-11 DIAGNOSIS — I10 Essential (primary) hypertension: Secondary | ICD-10-CM | POA: Diagnosis not present

## 2013-03-11 DIAGNOSIS — J309 Allergic rhinitis, unspecified: Secondary | ICD-10-CM

## 2013-03-11 DIAGNOSIS — E785 Hyperlipidemia, unspecified: Secondary | ICD-10-CM | POA: Diagnosis not present

## 2013-03-11 DIAGNOSIS — E038 Other specified hypothyroidism: Secondary | ICD-10-CM

## 2013-03-11 DIAGNOSIS — K219 Gastro-esophageal reflux disease without esophagitis: Secondary | ICD-10-CM

## 2013-03-11 DIAGNOSIS — Z0000 Encounter for general adult medical examination without abnormal findings: Secondary | ICD-10-CM

## 2013-03-11 DIAGNOSIS — Z8739 Personal history of other diseases of the musculoskeletal system and connective tissue: Secondary | ICD-10-CM

## 2013-03-11 DIAGNOSIS — E039 Hypothyroidism, unspecified: Secondary | ICD-10-CM | POA: Diagnosis not present

## 2013-03-11 DIAGNOSIS — E559 Vitamin D deficiency, unspecified: Secondary | ICD-10-CM

## 2013-03-11 LAB — CBC WITH DIFFERENTIAL/PLATELET
Basophils Relative: 1 % (ref 0–1)
Eosinophils Absolute: 0.1 10*3/uL (ref 0.0–0.7)
Eosinophils Relative: 2 % (ref 0–5)
Lymphs Abs: 1.9 10*3/uL (ref 0.7–4.0)
MCH: 29.2 pg (ref 26.0–34.0)
MCHC: 33 g/dL (ref 30.0–36.0)
MCV: 88.5 fL (ref 78.0–100.0)
Neutrophils Relative %: 62 % (ref 43–77)
Platelets: 232 10*3/uL (ref 150–400)
RDW: 14.3 % (ref 11.5–15.5)

## 2013-03-11 MED ORDER — LISINOPRIL 5 MG PO TABS: 5.0000 mg | ORAL_TABLET | Freq: Every day | ORAL | Status: DC

## 2013-03-11 MED ORDER — EZETIMIBE 10 MG PO TABS: 10.0000 mg | ORAL_TABLET | Freq: Every day | ORAL | Status: DC

## 2013-03-11 MED ORDER — LEVOTHYROXINE SODIUM 75 MCG PO TABS: 75.0000 ug | ORAL_TABLET | Freq: Every day | ORAL | Status: DC

## 2013-03-11 NOTE — Progress Notes (Signed)
Subjective:    [name], female    DOB: May 16, 1933, 79 y.o.   MRN: [id number]  HPI  79 year old White female for health maintenance and evaluation of medical problems. Has not had antihypertensive med this am. BP is 150/70. History of anxiety and PMR.  Has not had to see rheumatologist in about 3 years. It has been about 5 years since she has seen opthalmologist. Mammogram has been done. Has arthritis in fingers. Does a lot of knitting.   History of allergic rhinitis, hypothyroidism diagnosed in 1995, hyperlipidemia diagnosed in 1998, anxiety, polymyalgia rheumatica diagnosed in 2001, history of GE reflux, hypertension diagnosed in 2001, osteopenia, fibrocystic breast disease.  She is intolerant of Fosamax. It causes an adverse reaction. Augmentin causes nausea. She cannot tolerate statin medication but takes Zetia for hyperlipidemia.  Zostavax vaccine done in 2011, colonoscopy 2011.  Past medical history: 2 surgeries for endometriosis in the remote past. 1 surgery for endometriosis involved a left salpingo-oophorectomy. It that time there was obstruction of the sigmoid colon which resulted in a left colectomy performed by Dr. Rolene Course in 1969. Patient subsequently had a hysterectomy with right salpingo-oophorectomy performed by Dr. Allyson Sabal. She also had an ovarian cyst drained in the 1960s. Had an excisional biopsy of the right breast showed fibrocystic breast disease 1979. Cataract extraction of the right eye February 2006 and of the left eye in April 2006.  Social history: She is married. Nonsmoker. Does not consume alcohol. She is a Futures trader. Husband is retired Company secretary and has had some medical issues that are stressful. 2 adult children, a son and a daughter.  Family history: Father died at age 39 with history of pneumonia, COPD, heart problems. Mother died at age 40 of cancer that was metastatic or primary unknown to patient. 2 brothers with history of hypertension.  Daughter  has hypertension.  Treated in May for urinary tract infection which resolved with Cipro.              Review of Systems  Constitutional: Negative.   HENT: Positive for rhinorrhea.   Eyes: Negative.   Respiratory: Negative.   Endocrine:       History of hypothyroidism  Genitourinary: Positive for frequency.       Recent UTI  Allergic/Immunologic: Positive for environmental allergies.       History of hives treated with Zyrtec  Neurological: Negative.   Hematological: Negative.   Psychiatric/Behavioral:       Mild anxiety       Objective:   Physical Exam  Vitals reviewed. Constitutional: She is oriented to person, place, and time. She appears well-developed and well-nourished. No distress.  HENT:  Head: Normocephalic and atraumatic.  Right Ear: External ear normal.  Left Ear: External ear normal.  Mouth/Throat: Oropharynx is clear and moist. No oropharyngeal exudate.  Eyes: Conjunctivae and EOM are normal. Pupils are equal, round, and reactive to light. Right eye exhibits no discharge. Left eye exhibits no discharge. No scleral icterus.  Neck: Neck supple. No JVD present. No thyromegaly present.  Cardiovascular: Normal rate, regular rhythm, normal heart sounds and intact distal pulses.   No murmur heard. Pulmonary/Chest: Effort normal and breath sounds normal. No respiratory distress. She has no rales. She exhibits no tenderness.  Breasts normal female  Abdominal: Soft. Bowel sounds are normal. She exhibits no distension and no mass. There is no rebound and no guarding.  Genitourinary:  Deferred s/p hysterectomy BSO  Musculoskeletal: Normal range of motion. She exhibits  no edema.  Heberden's and Bouchard's nodes bilaterally  Lymphadenopathy:    She has no cervical adenopathy.  Neurological: She is alert and oriented to person, place, and time. She has normal reflexes. No cranial nerve deficit. Coordination normal.  Skin: Skin is warm and dry. She is not diaphoretic.   Psychiatric: She has a normal mood and affect. Her behavior is normal. Judgment and thought content normal.          Assessment & Plan:  History of polymyalgia rheumatica-currently not on prednisone and stable without recurrence  Hypertension-has not had antihypertensive medication today and blood pressure is elevated. Continue to monitor at home. Reminded patient to take blood pressure medicine before coming to office next time.  Hypothyroidism  Allergic rhinitis  Hyperlipidemia-intolerant of statin medication. Treated with Zetia  Osteopenia-intolerant of Fosamax  GE reflux  Plan: Return in 6 months to one year or as needed     Subjective:   Patient presents for Medicare Annual/Subsequent preventive examination.   Review Past Medical/Family/Social: see EPIC   Risk Factors  Current exercise habits: walks some Dietary issues discussed: low fat low carb  Cardiac risk factors: HTN and hyperlipidemia  Depression Screen  (Note: if answer to either of the following is "Yes", a more complete depression screening is indicated)   Over the past two weeks, have you felt down, depressed or hopeless? No  Over the past two weeks, have you felt little interest or pleasure in doing things? No Have you lost interest or pleasure in daily life? No Do you often feel hopeless? No Do you cry easily over simple problems? No   Activities of Daily Living  In your present state of health, do you have any difficulty performing the following activities?:   Driving? No  Managing money? No  Feeding yourself? No  Getting from bed to chair? No  Climbing a flight of stairs? No  Preparing food and eating?: No  Bathing or showering? No  Getting dressed: No  Getting to the toilet? No  Using the toilet:No  Moving around from place to place: No  In the past year have you fallen or had a near fall?:No  Are you sexually active? No  Do you have more than one partner? No   Hearing  Difficulties: No  Do you often ask people to speak up or repeat themselves? No  Do you experience ringing or noises in your ears? No  Do you have difficulty understanding soft or whispered voices? No  Do you feel that you have a problem with memory? No Do you often misplace items? No    Home Safety:  Do you have a smoke alarm at your residence? Yes Do you have grab bars in the bathroom? no Do you have throw rugs in your house? no   Cognitive Testing  Alert? Yes Normal Appearance?Yes  Oriented to person? Yes Place? Yes  Time? Yes  Recall of three objects? Yes  Can perform simple calculations? Yes  Displays appropriate judgment?Yes  Can read the correct time from a watch face?Yes   List the Names of Other Physician/Practitioners you currently use:  See referral list for the physicians patient is currently seeing. Dr. Dierdre Forth; Dr. Nile Riggs for eyes cataracts done about 5 years ago;    Review of Systems: see EPIC   Objective:     General appearance: Appears younger than stated age. Head: Normocephalic, without obvious abnormality, atraumatic  Eyes: conj clear, EOMi PEERLA  Ears: normal TM's and external ear  canals both ears  Nose: Nares normal. Septum midline. Mucosa normal. No drainage or sinus tenderness.  Throat: lips, mucosa, and tongue normal; teeth and gums normal  Neck: no adenopathy, no carotid bruit, no JVD, supple, symmetrical, trachea midline and thyroid not enlarged, symmetric, no tenderness/mass/nodules  No CVA tenderness.  Lungs: clear to auscultation bilaterally  Breasts: normal appearance, no masses or tenderness. Heart: regular rate and rhythm, S1, S2 normal, no murmur, click, rub or gallop  Abdomen: soft, non-tender; bowel sounds normal; no masses, no organomegaly  Musculoskeletal: ROM normal in all joints, no crepitus, no deformity, Normal muscle strengthen. Back  is symmetric, no curvature. Skin: Skin color, texture, turgor normal. No rashes or lesions   Lymph nodes: Cervical, supraclavicular, and axillary nodes normal.  Neurologic: CN 2 -12 Normal, Normal symmetric reflexes. Normal coordination and gait  Psych: Alert & Oriented x 3, Mood appear stable.    Assessment:    Annual wellness medicare exam   Plan:    During the course of the visit the patient was educated and counseled about appropriate screening and preventive services including:   Annual mammogram Has gained 5 pounds since last year- encourage walking Needs eye exam     Patient Instructions (the written plan) was given to the patient.  Medicare Attestation  I have personally reviewed:  The patient's medical and social history  Their use of alcohol, tobacco or illicit drugs  Their current medications and supplements  The patient's functional ability including ADLs,fall risks, home safety risks, cognitive, and hearing and visual impairment  Diet and physical activities  Evidence for depression or mood disorders  The patient's weight, height, BMI, and visual acuity have been recorded in the chart. I have made referrals, counseling, and provided education to the patient based on review of the above and I have provided the patient with a written personalized care plan for preventive services.

## 2013-03-12 LAB — LIPID PANEL
Cholesterol: 224 mg/dL — ABNORMAL HIGH (ref 0–200)
LDL Cholesterol: 117 mg/dL — ABNORMAL HIGH (ref 0–99)
Total CHOL/HDL Ratio: 3.4 Ratio
Triglycerides: 208 mg/dL — ABNORMAL HIGH (ref <150)
VLDL: 42 mg/dL — ABNORMAL HIGH (ref 0–40)

## 2013-03-12 LAB — COMPREHENSIVE METABOLIC PANEL
ALT: 24 U/L (ref 0–35)
Alkaline Phosphatase: 60 U/L (ref 39–117)
CO2: 30 mEq/L (ref 19–32)
Creat: 0.9 mg/dL (ref 0.50–1.10)
Sodium: 140 mEq/L (ref 135–145)
Total Bilirubin: 0.5 mg/dL (ref 0.3–1.2)
Total Protein: 7.8 g/dL (ref 6.0–8.3)

## 2013-08-15 ENCOUNTER — Emergency Department (HOSPITAL_COMMUNITY): Payer: No Typology Code available for payment source

## 2013-08-15 ENCOUNTER — Emergency Department (HOSPITAL_COMMUNITY)
Admission: EM | Admit: 2013-08-15 | Discharge: 2013-08-15 | Disposition: A | Discharge status: Home / Self Care | Payer: No Typology Code available for payment source | Attending: Emergency Medicine | Admitting: Emergency Medicine

## 2013-08-15 ENCOUNTER — Encounter (HOSPITAL_COMMUNITY): Payer: Self-pay | Admitting: Emergency Medicine

## 2013-08-15 DIAGNOSIS — R05 Cough: Secondary | ICD-10-CM | POA: Insufficient documentation

## 2013-08-15 DIAGNOSIS — E039 Hypothyroidism, unspecified: Secondary | ICD-10-CM | POA: Insufficient documentation

## 2013-08-15 DIAGNOSIS — Z8739 Personal history of other diseases of the musculoskeletal system and connective tissue: Secondary | ICD-10-CM | POA: Insufficient documentation

## 2013-08-15 DIAGNOSIS — Z79899 Other long term (current) drug therapy: Secondary | ICD-10-CM | POA: Insufficient documentation

## 2013-08-15 DIAGNOSIS — Y9389 Activity, other specified: Secondary | ICD-10-CM | POA: Insufficient documentation

## 2013-08-15 DIAGNOSIS — I10 Essential (primary) hypertension: Secondary | ICD-10-CM | POA: Insufficient documentation

## 2013-08-15 DIAGNOSIS — Z8719 Personal history of other diseases of the digestive system: Secondary | ICD-10-CM | POA: Insufficient documentation

## 2013-08-15 DIAGNOSIS — Z8659 Personal history of other mental and behavioral disorders: Secondary | ICD-10-CM | POA: Insufficient documentation

## 2013-08-15 DIAGNOSIS — R079 Chest pain, unspecified: Secondary | ICD-10-CM | POA: Diagnosis not present

## 2013-08-15 DIAGNOSIS — S20212A Contusion of left front wall of thorax, initial encounter: Secondary | ICD-10-CM

## 2013-08-15 DIAGNOSIS — R0609 Other forms of dyspnea: Secondary | ICD-10-CM | POA: Insufficient documentation

## 2013-08-15 DIAGNOSIS — S20219A Contusion of unspecified front wall of thorax, initial encounter: Secondary | ICD-10-CM | POA: Insufficient documentation

## 2013-08-15 DIAGNOSIS — E785 Hyperlipidemia, unspecified: Secondary | ICD-10-CM | POA: Insufficient documentation

## 2013-08-15 DIAGNOSIS — Z880 Allergy status to penicillin: Secondary | ICD-10-CM | POA: Insufficient documentation

## 2013-08-15 DIAGNOSIS — R059 Cough, unspecified: Secondary | ICD-10-CM | POA: Insufficient documentation

## 2013-08-15 DIAGNOSIS — Y92410 Unspecified street and highway as the place of occurrence of the external cause: Secondary | ICD-10-CM | POA: Insufficient documentation

## 2013-08-15 DIAGNOSIS — Z8742 Personal history of other diseases of the female genital tract: Secondary | ICD-10-CM | POA: Insufficient documentation

## 2013-08-15 DIAGNOSIS — R0989 Other specified symptoms and signs involving the circulatory and respiratory systems: Secondary | ICD-10-CM | POA: Insufficient documentation

## 2013-08-15 MED ORDER — METHOCARBAMOL 500 MG PO TABS
500.0000 mg | ORAL_TABLET | Freq: Once | ORAL | Status: AC
  Administered 2013-08-15: 500 mg via ORAL
  Filled 2013-08-15: qty 1

## 2013-08-15 MED ORDER — METHOCARBAMOL 500 MG PO TABS: 500.0000 mg | ORAL_TABLET | Freq: Four times a day (QID) | ORAL | Status: DC | PRN

## 2013-08-15 MED ORDER — HYDROCODONE-ACETAMINOPHEN 5-325 MG PO TABS
1.0000 | ORAL_TABLET | Freq: Once | ORAL | Status: AC
  Administered 2013-08-15: 1 via ORAL
  Filled 2013-08-15: qty 1

## 2013-08-15 MED ORDER — HYDROCODONE-ACETAMINOPHEN 5-325 MG PO TABS: 1.0000 | ORAL_TABLET | Freq: Four times a day (QID) | ORAL | Status: DC | PRN
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
## 2013-08-15 NOTE — ED Notes (Signed)
Patient transported to X-ray 
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
## 2013-08-31 NOTE — Patient Instructions (Signed)
Take blood pressure medicine before coming to office. Monitor blood pressure at home and call if persistently elevated. Return in 6-12 months.

## 2013-10-30 DIAGNOSIS — Z961 Presence of intraocular lens: Secondary | ICD-10-CM | POA: Diagnosis not present

## 2013-10-30 DIAGNOSIS — H26499 Other secondary cataract, unspecified eye: Secondary | ICD-10-CM | POA: Diagnosis not present

## 2013-11-05 DIAGNOSIS — H26499 Other secondary cataract, unspecified eye: Secondary | ICD-10-CM | POA: Diagnosis not present

## 2013-11-12 DIAGNOSIS — H26499 Other secondary cataract, unspecified eye: Secondary | ICD-10-CM | POA: Diagnosis not present

## 2014-03-16 ENCOUNTER — Encounter: Payer: Self-pay | Admitting: Internal Medicine

## 2014-03-16 ENCOUNTER — Ambulatory Visit (INDEPENDENT_AMBULATORY_CARE_PROVIDER_SITE_OTHER): Payer: Medicare Other | Admitting: Internal Medicine

## 2014-03-16 VITALS — BP 130/80 | HR 80 | Temp 98.6°F | Ht 62.5 in | Wt 148.0 lb

## 2014-03-16 DIAGNOSIS — M19042 Primary osteoarthritis, left hand: Secondary | ICD-10-CM

## 2014-03-16 DIAGNOSIS — E785 Hyperlipidemia, unspecified: Secondary | ICD-10-CM | POA: Diagnosis not present

## 2014-03-16 DIAGNOSIS — I10 Essential (primary) hypertension: Secondary | ICD-10-CM | POA: Diagnosis not present

## 2014-03-16 DIAGNOSIS — Z0000 Encounter for general adult medical examination without abnormal findings: Secondary | ICD-10-CM | POA: Diagnosis not present

## 2014-03-16 DIAGNOSIS — Z872 Personal history of diseases of the skin and subcutaneous tissue: Secondary | ICD-10-CM

## 2014-03-16 DIAGNOSIS — Z130 Encounter for screening for diseases of the blood and blood-forming organs and certain disorders involving the immune mechanism: Secondary | ICD-10-CM

## 2014-03-16 DIAGNOSIS — M8580 Other specified disorders of bone density and structure, unspecified site: Secondary | ICD-10-CM

## 2014-03-16 DIAGNOSIS — E039 Hypothyroidism, unspecified: Secondary | ICD-10-CM

## 2014-03-16 DIAGNOSIS — M19041 Primary osteoarthritis, right hand: Secondary | ICD-10-CM

## 2014-03-16 DIAGNOSIS — J309 Allergic rhinitis, unspecified: Secondary | ICD-10-CM | POA: Diagnosis not present

## 2014-03-16 DIAGNOSIS — F411 Generalized anxiety disorder: Secondary | ICD-10-CM

## 2014-03-16 DIAGNOSIS — M949 Disorder of cartilage, unspecified: Secondary | ICD-10-CM

## 2014-03-16 DIAGNOSIS — K219 Gastro-esophageal reflux disease without esophagitis: Secondary | ICD-10-CM

## 2014-03-16 DIAGNOSIS — M19049 Primary osteoarthritis, unspecified hand: Secondary | ICD-10-CM

## 2014-03-16 DIAGNOSIS — M899 Disorder of bone, unspecified: Secondary | ICD-10-CM

## 2014-03-16 DIAGNOSIS — Z8739 Personal history of other diseases of the musculoskeletal system and connective tissue: Secondary | ICD-10-CM

## 2014-03-16 LAB — LIPID PANEL
Cholesterol: 242 mg/dL — ABNORMAL HIGH (ref 0–200)
HDL: 57 mg/dL (ref >39)
LDL CALC: 140 mg/dL — AB (ref 0–99)
Total CHOL/HDL Ratio: 4.2 Ratio
Triglycerides: 223 mg/dL — ABNORMAL HIGH (ref <150)
VLDL: 45 mg/dL — ABNORMAL HIGH (ref 0–40)

## 2014-03-16 LAB — CBC WITH DIFFERENTIAL/PLATELET
BASOS ABS: 0.1 10*3/uL (ref 0.0–0.1)
BASOS PCT: 1 % (ref 0–1)
Eosinophils Absolute: 0.1 10*3/uL (ref 0.0–0.7)
Eosinophils Relative: 2 % (ref 0–5)
HCT: 38.9 % (ref 36.0–46.0)
Hemoglobin: 13 g/dL (ref 12.0–15.0)
Lymphocytes Relative: 30 % (ref 12–46)
Lymphs Abs: 1.7 10*3/uL (ref 0.7–4.0)
MCH: 29.8 pg (ref 26.0–34.0)
MCHC: 33.4 g/dL (ref 30.0–36.0)
MCV: 89.2 fL (ref 78.0–100.0)
Monocytes Absolute: 0.3 10*3/uL (ref 0.1–1.0)
Monocytes Relative: 5 % (ref 3–12)
NEUTROS ABS: 3.4 10*3/uL (ref 1.7–7.7)
NEUTROS PCT: 62 % (ref 43–77)
PLATELETS: 206 10*3/uL (ref 150–400)
RBC: 4.36 MIL/uL (ref 3.87–5.11)
RDW: 13.8 % (ref 11.5–15.5)
WBC: 5.5 10*3/uL (ref 4.0–10.5)

## 2014-03-16 LAB — POCT URINALYSIS DIPSTICK
Bilirubin, UA: NEGATIVE
GLUCOSE UA: NEGATIVE
Ketones, UA: NEGATIVE
LEUKOCYTES UA: NEGATIVE
Nitrite, UA: NEGATIVE
Protein, UA: NEGATIVE
RBC UA: NEGATIVE
Spec Grav, UA: <=1.005
Urobilinogen, UA: NEGATIVE
pH, UA: 6.5

## 2014-03-16 LAB — COMPREHENSIVE METABOLIC PANEL
ALBUMIN: 4.7 g/dL (ref 3.5–5.2)
ALK PHOS: 53 U/L (ref 39–117)
ALT: 21 U/L (ref 0–35)
AST: 20 U/L (ref 0–37)
BUN: 17 mg/dL (ref 6–23)
CO2: 31 mEq/L (ref 19–32)
Calcium: 9.8 mg/dL (ref 8.4–10.5)
Chloride: 102 mEq/L (ref 96–112)
Creat: 0.91 mg/dL (ref 0.50–1.10)
Glucose, Bld: 93 mg/dL (ref 70–99)
POTASSIUM: 4.2 meq/L (ref 3.5–5.3)
SODIUM: 142 meq/L (ref 135–145)
TOTAL PROTEIN: 7.6 g/dL (ref 6.0–8.3)
Total Bilirubin: 0.6 mg/dL (ref 0.2–1.2)

## 2014-03-16 NOTE — Progress Notes (Signed)
Subjective:    [name], female    DOB: 04-23-1933, 80 y.o.   MRN: [id number]  HPI  80 year old White Female for health maintenance and evaluation of medical  issues. History of anxiety and polymyalgia rheumatica which was diagnosed in 2001 and has resolved. Has not had to see rheumatologist in several years. History of osteoarthritis in hands. History of allergic rhinitis, history of hypothyroidism diagnosed in 1995, history of hyperlipidemia diagnosed in 1998. She is to take Zetia but discontinued it because of cost. History of osteopenia, GE reflux, hypertension which was diagnosed in 2001. History of fibrocystic breast disease.  She is intolerant of Fosamax it causes an adverse reaction. Augmentin causes nausea. She cannot tolerate statin medications.  Zostavax vaccine done 2011. Colonoscopy 2011.  Past medical history: 2 surgeries for endometriosis in the remote past. One surgery for endometriosis involving left salpingo-oophorectomy. During that time there was no obstruction of the sigmoid colon which resulted in left colectomy performed by Dr. Rolene CourseFarley in 1969. Patient subsequently had a hysterectomy and right salpingo-oophorectomy performed by Dr. Allyson SabalBerry. She had an ovarian cyst drain in the 1960s. Had excisional biopsy of the right breast showing fibrocystic breast disease 1979. Cataract extraction right eye February 2006 and left eye April 2006.  Social history: She is married. Nonsmoker. Does not consume alcohol. 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 2 adult children, a son and a daughter.  Family history: Father died at age 78 with history of pneumonia COPD heart problems. Mother died at age 78 of cancer that was metastatic with primary unknown to patient. 2 brothers with history of hypertension. Daughter has hypertension.  History of UTI May 2014.    Review of Systems  Constitutional: Positive for  fatigue.  HENT: Negative.   Eyes:       Has recently seen Dr. Nile RiggsShapiro.  Respiratory: Negative.   Cardiovascular: Negative.   Gastrointestinal: Negative.   Endocrine:       History of hypothyroidism  Genitourinary: Negative.   Musculoskeletal:       Hand arthritis  Allergic/Immunologic: Positive for environmental allergies.       Trees, grasses, chocolate, green peas, watermelon canteloupe  Neurological: Negative.   Hematological: Negative.   Psychiatric/Behavioral:       Stress and anxiety with husband's illness.       Objective:   Physical Exam  Vitals reviewed. Constitutional: She is oriented to person, place, and time. She appears well-developed and well-nourished. No distress.  HENT:  Head: Normocephalic and atraumatic.  Right Ear: External ear normal.  Left Ear: External ear normal.  Mouth/Throat: Oropharynx is clear and moist. No oropharyngeal exudate.  Eyes: Conjunctivae and EOM are normal. Pupils are equal, round, and reactive to light. Right eye exhibits no discharge. Left eye exhibits no discharge. No scleral icterus.  Neck: Normal range of motion. Neck supple. No JVD present. No tracheal deviation present. No thyromegaly present.  Cardiovascular: Normal rate, regular rhythm, normal heart sounds and intact distal pulses.   No murmur heard. Pulmonary/Chest: Effort normal and breath sounds normal. No respiratory distress. She has no wheezes. She has no rales. She exhibits no tenderness.  Breasts normal female  Abdominal: Soft. Bowel sounds are normal. She exhibits no distension and no mass. There is no tenderness. There is no rebound and no guarding.  Genitourinary:  Deferred status post hysterectomy BSO  Musculoskeletal: She exhibits no edema.  Bilateral Heberden's  and Bouchard's nodes  Lymphadenopathy:    She has no cervical adenopathy.  Neurological: She is alert and oriented to person, place, and time. No cranial nerve deficit. Coordination normal.  Skin: Skin  is warm and dry. No rash noted. She is not diaphoretic.  Psychiatric: She has a normal mood and affect. Her behavior is normal. Judgment and thought content normal.          Assessment & Plan:  History of polymyalgia rheumatica-resolved  Hypertension-stable  History of hypothyroidism  History of allergic rhinitis  History of hives treated with Zyrtec  History of anxiety  Hyperlipidemia-has stopped Zetia due to cost. Intolerant of statin medication.  Osteoarthritis of hands  GE reflux  Osteopenia intolerant of Fosamax   Subjective:   Patient presents for Medicare Annual/Subsequent preventive examination.  Review Past Medical/Family/Social: See above  Risk Factors  Current exercise habits: housework and gardening Dietary issues discussed: low fat lowcarb  Cardiac risk factors:HTN,  Hyperlipidemia,Hypothyroidism  Depression Screen  (Note: if answer to either of the following is "Yes", a more complete depression screening is indicated)   Over the past two weeks, have you felt down, depressed or hopeless? No  Over the past two weeks, have you felt little interest or pleasure in doing things? No Have you lost interest or pleasure in daily life? No Do you often feel hopeless? No Do you cry easily over simple problems? No   Activities of Daily Living  In your present state of health, do you have any difficulty performing the following activities?:   Driving? No  Managing money? No  Feeding yourself? No  Getting from bed to chair? No  Climbing a flight of stairs? No  Preparing food and eating?: No  Bathing or showering? No  Getting dressed: No  Getting to the toilet? No  Using the toilet:No  Moving around from place to place: No  In the past year have you fallen or had a near fall?:No  Are you sexually active? No  Do you have more than one partner? No   Hearing Difficulties: No  Do you often ask people to speak up or repeat themselves? No  Do you  experience ringing or noises in your ears? No  Do you have difficulty understanding soft or whispered voices? No  Do you feel that you have a problem with memory? No Do you often misplace items? No    Home Safety:  Do you have a smoke alarm at your residence? Yes Do you have grab bars in the bathroom? yes Do you have throw rugs in your house?no   Cognitive Testing  Alert? Yes Normal Appearance?Yes  Oriented to person? Yes Place? Yes  Time? Yes  Recall of three objects? Yes  Can perform simple calculations? Yes  Displays appropriate judgment?Yes  Can read the correct time from a watch face?Yes   List the Names of Other Physician/Practitioners you currently use:  See referral list for the physicians patient is currently seeing.  Dr. Nile Riggs eye physician.   Review of Systems: see above   Objective:     General appearance: Appears stated age  Head: Normocephalic, without obvious abnormality, atraumatic  Eyes: conj clear, EOMi PEERLA  Ears: normal TM's and external ear canals both ears  Nose: Nares normal. Septum midline. Mucosa normal. No drainage or sinus tenderness.  Throat: lips, mucosa, and tongue normal; teeth and gums normal  Neck: no adenopathy, no carotid bruit, no JVD, supple, symmetrical, trachea midline and thyroid not enlarged, symmetric,  no tenderness/mass/nodules  No CVA tenderness.  Lungs: clear to auscultation bilaterally  Breasts: normal appearance, no masses or tenderness Heart: regular rate and rhythm, S1, S2 normal, no murmur, click, rub or gallop  Abdomen: soft, non-tender; bowel sounds normal; no masses, no organomegaly  Musculoskeletal: ROM normal in all joints, no crepitus, no deformity, Normal muscle strengthen. Back  is symmetric, no curvature. Skin: Skin color, texture, turgor normal. No rashes or lesions  Lymph nodes: Cervical, supraclavicular, and axillary nodes normal.  Neurologic: CN 2 -12 Normal, Normal symmetric reflexes. Normal  coordination and gait  Psych: Alert & Oriented x 3, Mood appear stable.    Assessment:    Annual wellness medicare exam   Plan:    During the course of the visit the patient was educated and counseled about appropriate screening and preventive services including:   Annual mammogram     Patient Instructions (the written plan) was given to the patient.  Medicare Attestation  I have personally reviewed:  The patient's medical and social history  Their use of alcohol, tobacco or illicit drugs  Their current medications and supplements  The patient's functional ability including ADLs,fall risks, home safety risks, cognitive, and hearing and visual impairment  Diet and physical activities  Evidence for depression or mood disorders  The patient's weight, height, BMI, and visual acuity have been recorded in the chart. I have made referrals, counseling, and provided education to the patient based on review of the above and I have provided the patient with a written personalized care plan for preventive services.

## 2014-03-17 LAB — TSH: TSH: 7.518 u[IU]/mL — AB (ref 0.350–4.500)

## 2014-03-18 ENCOUNTER — Telehealth: Payer: Self-pay

## 2014-03-18 NOTE — Telephone Encounter (Signed)
Patient states she has not been taking Synthroid on a daily basis, due to husband being in the hospital. Per Dr. Lenord FellersBaxley, take daily and recheck a TSH in 4 weeks.

## 2014-03-19 ENCOUNTER — Other Ambulatory Visit: Payer: Self-pay | Admitting: Internal Medicine

## 2014-03-26 ENCOUNTER — Other Ambulatory Visit: Payer: Self-pay

## 2014-03-26 DIAGNOSIS — Z1231 Encounter for screening mammogram for malignant neoplasm of breast: Secondary | ICD-10-CM

## 2014-03-31 ENCOUNTER — Encounter: Payer: Self-pay | Admitting: Internal Medicine

## 2014-03-31 NOTE — Patient Instructions (Signed)
Return in 6 months to one year or as needed. Continue same medications.

## 2014-04-07 ENCOUNTER — Ambulatory Visit
Admission: RE | Admit: 2014-04-07 | Discharge: 2014-04-07 | Disposition: A | Discharge status: Home / Self Care | Payer: Medicare Other | Source: Ambulatory Visit

## 2014-04-07 DIAGNOSIS — Z1231 Encounter for screening mammogram for malignant neoplasm of breast: Secondary | ICD-10-CM | POA: Diagnosis not present

## 2014-04-16 ENCOUNTER — Other Ambulatory Visit: Payer: Medicare Other | Admitting: Internal Medicine

## 2014-04-16 DIAGNOSIS — E039 Hypothyroidism, unspecified: Secondary | ICD-10-CM

## 2014-04-16 LAB — TSH: TSH: 1.275 u[IU]/mL (ref 0.350–4.500)

## 2014-06-17 ENCOUNTER — Other Ambulatory Visit: Payer: Self-pay | Admitting: Internal Medicine

## 2014-06-18 ENCOUNTER — Other Ambulatory Visit: Payer: Self-pay | Admitting: Internal Medicine

## 2014-10-05 ENCOUNTER — Telehealth: Payer: Self-pay | Admitting: Internal Medicine

## 2014-10-05 MED ORDER — LEVOTHYROXINE SODIUM 75 MCG PO TABS: 75.0000 ug | ORAL_TABLET | Freq: Every day | ORAL | Status: DC

## 2014-10-05 MED ORDER — LISINOPRIL 5 MG PO TABS: 5.0000 mg | ORAL_TABLET | Freq: Every day | ORAL | Status: DC

## 2014-10-05 NOTE — Telephone Encounter (Signed)
Patient requesting refills on lisinopril and Synthroid. Has not had physical examination normal been seen here in some time. Refill each for 30 days. Patient needs appointment for complete physical exam.

## 2014-10-06 ENCOUNTER — Other Ambulatory Visit: Payer: Self-pay | Admitting: *Deleted

## 2014-10-06 MED ORDER — LISINOPRIL 5 MG PO TABS: 5.0000 mg | ORAL_TABLET | Freq: Every day | ORAL | Status: DC

## 2014-10-06 MED ORDER — LEVOTHYROXINE SODIUM 75 MCG PO TABS: 75.0000 ug | ORAL_TABLET | Freq: Every day | ORAL | Status: DC

## 2014-10-06 NOTE — Telephone Encounter (Signed)
Patient given 30 day supply of Lisinopril and Levothyroxin pt needs appt before any more refills per Dr Lenord FellersBaxley

## 2014-10-19 ENCOUNTER — Other Ambulatory Visit: Payer: Medicare Other | Admitting: Internal Medicine

## 2014-10-19 DIAGNOSIS — E039 Hypothyroidism, unspecified: Secondary | ICD-10-CM

## 2014-10-19 LAB — TSH: TSH: 0.847 u[IU]/mL (ref 0.350–4.500)

## 2014-10-20 ENCOUNTER — Ambulatory Visit (INDEPENDENT_AMBULATORY_CARE_PROVIDER_SITE_OTHER): Payer: Medicare Other | Admitting: Internal Medicine

## 2014-10-20 ENCOUNTER — Encounter: Payer: Self-pay | Admitting: Internal Medicine

## 2014-10-20 VITALS — BP 136/70 | HR 80 | Temp 97.7°F | Wt 146.0 lb

## 2014-10-20 DIAGNOSIS — E039 Hypothyroidism, unspecified: Secondary | ICD-10-CM

## 2014-10-20 DIAGNOSIS — I10 Essential (primary) hypertension: Secondary | ICD-10-CM | POA: Diagnosis not present

## 2014-10-20 DIAGNOSIS — F439 Reaction to severe stress, unspecified: Secondary | ICD-10-CM

## 2014-10-20 DIAGNOSIS — E785 Hyperlipidemia, unspecified: Secondary | ICD-10-CM | POA: Diagnosis not present

## 2014-10-20 DIAGNOSIS — Z658 Other specified problems related to psychosocial circumstances: Secondary | ICD-10-CM | POA: Diagnosis not present

## 2014-10-20 DIAGNOSIS — Z789 Other specified health status: Secondary | ICD-10-CM

## 2014-10-20 DIAGNOSIS — Z889 Allergy status to unspecified drugs, medicaments and biological substances status: Secondary | ICD-10-CM

## 2014-10-20 DIAGNOSIS — F411 Generalized anxiety disorder: Secondary | ICD-10-CM | POA: Diagnosis not present

## 2014-10-20 DIAGNOSIS — Z8739 Personal history of other diseases of the musculoskeletal system and connective tissue: Secondary | ICD-10-CM

## 2014-10-20 NOTE — Patient Instructions (Signed)
Continue same medications and return in July for physical exam. Return next week for Prevnar 13.

## 2014-10-20 NOTE — Progress Notes (Signed)
   Subjective:    [name], female    DOB: [birth year], 81 y.o.   MRN: [id number]  HPI  81 year old White Female for 6 month recheck. History of hypertension and hypothyroidism. Blood pressure is stable. TSH is within normal limits on current dose of thyroid replacement. Patient is anxious. Husband has been sick a lot and in the hospital since last visit. She's had some issues with telephone trouble in computer trouble over the past 24 hours. Daughter is away on a mission trip in [place]. Patient is statin intolerant. History of hyperlipidemia. Only going to measure her lipids once a year during physical examination.    Review of Systems     Objective:   Physical Exam Skin warm and dry. Nodes none. Neck supple without JVD thyromegaly or carotid bruits. Chest clear to auscultation. Cardiac exam regular rate and rhythm normal S1 and S2. Extremities without edema.       Assessment & Plan:  Hypertension-stable on current regimen  Anxiety due to situational stress  Situational stress-husband has been ill  and in the hospital multiple times  History of polymyalgia rheumatica. No recurrence  Hypothyroidism-TSH is within normal limits on current dose of thyroid replacement  Statin intolerance  Hyperlipidemia-to measure lipids once yearly  Plan: Return in July for physical examination. Is return next week for Prevnar 13. We are out of it today. Continue same medications. Unable to take statin medication for hyperlipidemia.  25 minutes spent with patient including examination and coordination of care

## 2014-11-06 ENCOUNTER — Ambulatory Visit (INDEPENDENT_AMBULATORY_CARE_PROVIDER_SITE_OTHER): Payer: Medicare Other | Admitting: Internal Medicine

## 2014-11-06 VITALS — BP 122/64 | Temp 98.0°F

## 2014-11-06 DIAGNOSIS — Z23 Encounter for immunization: Secondary | ICD-10-CM

## 2014-11-06 NOTE — Progress Notes (Signed)
Patient presents for Prevnar injection. Patient tolerated well

## 2014-11-09 ENCOUNTER — Other Ambulatory Visit: Payer: Self-pay | Admitting: *Deleted

## 2014-11-09 MED ORDER — LISINOPRIL 5 MG PO TABS: 5.0000 mg | ORAL_TABLET | Freq: Every day | ORAL | Status: DC

## 2014-11-09 MED ORDER — LEVOTHYROXINE SODIUM 75 MCG PO TABS: 75.0000 ug | ORAL_TABLET | Freq: Every day | ORAL | Status: DC

## 2014-11-09 NOTE — Telephone Encounter (Signed)
Refills sent for lisinopril and levothyroxine to patient pharmacy

## 2014-12-03 DIAGNOSIS — Z961 Presence of intraocular lens: Secondary | ICD-10-CM | POA: Diagnosis not present

## 2015-04-12 ENCOUNTER — Ambulatory Visit (INDEPENDENT_AMBULATORY_CARE_PROVIDER_SITE_OTHER): Payer: Medicare Other | Admitting: Internal Medicine

## 2015-04-12 ENCOUNTER — Encounter: Payer: Self-pay | Admitting: Internal Medicine

## 2015-04-12 VITALS — BP 124/68 | HR 77 | Temp 97.8°F | Ht 63.0 in | Wt 145.0 lb

## 2015-04-12 DIAGNOSIS — F411 Generalized anxiety disorder: Secondary | ICD-10-CM | POA: Diagnosis not present

## 2015-04-12 DIAGNOSIS — M19042 Primary osteoarthritis, left hand: Secondary | ICD-10-CM

## 2015-04-12 DIAGNOSIS — R5383 Other fatigue: Secondary | ICD-10-CM

## 2015-04-12 DIAGNOSIS — Z0000 Encounter for general adult medical examination without abnormal findings: Secondary | ICD-10-CM

## 2015-04-12 DIAGNOSIS — I10 Essential (primary) hypertension: Secondary | ICD-10-CM

## 2015-04-12 DIAGNOSIS — J309 Allergic rhinitis, unspecified: Secondary | ICD-10-CM

## 2015-04-12 DIAGNOSIS — E039 Hypothyroidism, unspecified: Secondary | ICD-10-CM | POA: Diagnosis not present

## 2015-04-12 DIAGNOSIS — M19041 Primary osteoarthritis, right hand: Secondary | ICD-10-CM | POA: Diagnosis not present

## 2015-04-12 DIAGNOSIS — E785 Hyperlipidemia, unspecified: Secondary | ICD-10-CM

## 2015-04-12 DIAGNOSIS — E559 Vitamin D deficiency, unspecified: Secondary | ICD-10-CM

## 2015-04-12 DIAGNOSIS — Z889 Allergy status to unspecified drugs, medicaments and biological substances status: Secondary | ICD-10-CM

## 2015-04-12 DIAGNOSIS — K219 Gastro-esophageal reflux disease without esophagitis: Secondary | ICD-10-CM

## 2015-04-12 DIAGNOSIS — M8580 Other specified disorders of bone density and structure, unspecified site: Secondary | ICD-10-CM | POA: Diagnosis not present

## 2015-04-12 DIAGNOSIS — Z79899 Other long term (current) drug therapy: Secondary | ICD-10-CM

## 2015-04-12 DIAGNOSIS — Z789 Other specified health status: Secondary | ICD-10-CM

## 2015-04-12 LAB — POCT URINALYSIS DIPSTICK
Bilirubin, UA: NEGATIVE
Glucose, UA: NEGATIVE
Ketones, UA: NEGATIVE
Leukocytes, UA: NEGATIVE
Nitrite, UA: NEGATIVE
PROTEIN UA: NEGATIVE
RBC UA: NEGATIVE
SPEC GRAV UA: 1.02
UROBILINOGEN UA: NEGATIVE
pH, UA: 6

## 2015-04-12 LAB — CBC WITH DIFFERENTIAL/PLATELET
BASOS ABS: 0.1 10*3/uL (ref 0.0–0.1)
Basophils Relative: 1 % (ref 0–1)
EOS ABS: 0.1 10*3/uL (ref 0.0–0.7)
EOS PCT: 1 % (ref 0–5)
HCT: 39.9 % (ref 36.0–46.0)
Hemoglobin: 13.2 g/dL (ref 12.0–15.0)
LYMPHS ABS: 1.7 10*3/uL (ref 0.7–4.0)
Lymphocytes Relative: 28 % (ref 12–46)
MCH: 30.1 pg (ref 26.0–34.0)
MCHC: 33.1 g/dL (ref 30.0–36.0)
MCV: 91.1 fL (ref 78.0–100.0)
MONOS PCT: 4 % (ref 3–12)
MPV: 8.4 fL — AB (ref 8.6–12.4)
Monocytes Absolute: 0.2 10*3/uL (ref 0.1–1.0)
NEUTROS ABS: 3.9 10*3/uL (ref 1.7–7.7)
NEUTROS PCT: 66 % (ref 43–77)
Platelets: 231 10*3/uL (ref 150–400)
RBC: 4.38 MIL/uL (ref 3.87–5.11)
RDW: 13.5 % (ref 11.5–15.5)
WBC: 5.9 10*3/uL (ref 4.0–10.5)

## 2015-04-12 LAB — LIPID PANEL
CHOL/HDL RATIO: 4.3 ratio
Cholesterol: 252 mg/dL — ABNORMAL HIGH (ref 0–200)
HDL: 59 mg/dL (ref >=46)
LDL Cholesterol: 162 mg/dL — ABNORMAL HIGH (ref 0–99)
Triglycerides: 154 mg/dL — ABNORMAL HIGH (ref <150)
VLDL: 31 mg/dL (ref 0–40)

## 2015-04-12 LAB — COMPLETE METABOLIC PANEL WITH GFR
ALK PHOS: 55 U/L (ref 39–117)
ALT: 18 U/L (ref 0–35)
AST: 20 U/L (ref 0–37)
Albumin: 4.7 g/dL (ref 3.5–5.2)
BILIRUBIN TOTAL: 0.4 mg/dL (ref 0.2–1.2)
BUN: 22 mg/dL (ref 6–23)
CO2: 29 mEq/L (ref 19–32)
CREATININE: 0.86 mg/dL (ref 0.50–1.10)
Calcium: 9.9 mg/dL (ref 8.4–10.5)
Chloride: 103 mEq/L (ref 96–112)
GFR, Est African American: 74 mL/min
GFR, Est Non African American: 64 mL/min
GLUCOSE: 95 mg/dL (ref 70–99)
Potassium: 4.1 mEq/L (ref 3.5–5.3)
Sodium: 143 mEq/L (ref 135–145)
TOTAL PROTEIN: 7.7 g/dL (ref 6.0–8.3)

## 2015-04-12 LAB — TSH: TSH: 1.91 u[IU]/mL (ref 0.350–4.500)

## 2015-04-12 NOTE — Patient Instructions (Addendum)
Continue same meds and return in one year. Try to watch diet and exercise.

## 2015-04-12 NOTE — Progress Notes (Signed)
Subjective:    [name], female    DOB: Jun 26, 1933, 81 y.o.   MRN: [id number]  HPI 81 year old  White Female for health maintenance and evaluation of medical problems. She has a history of anxiety. History of polymyalgia rheumatica diagnosed in 2001 and has resolved. Has not had to see rheumatologist in a number of years. History of osteoarthritis in hands, allergic rhinitis, hypothyroidism diagnosed in 1995, hyperlipidemia diagnosed in 1998, osteopenia, GE reflux hypertension diagnosed in 2001, history of fibrocystic breast disease. She is to take Zetia but discontinued it because of cost.    She is intolerant of Fosamax-it causes an adverse reaction. Augmentin causes nausea. Intolerant of statin medication  Colonoscopy done 2011. Zostavax vaccine done 2011.  Past medical history: 2 surgeries for endometriosis in the remote past. One surgery for endometriosis involving the left salpingo-oophorectomy. During that time there was  obstruction of the sigmoid colon which resulted in left colectomy for by Dr. Rolene CourseFarley in 1969.  Patient subsequently had hysterectomy and right salpingo-oophorectomy performed by Dr. Allyson SabalBerry. She had an ovarian cyst drained in the 1960s. Had excisional biopsy of the right breast showing fibrocystic breast disease in 1979.  Cataract extraction right eye February 2006 and left April 2006.  Social history: She is married. Husband has numerous medical problems. He is retired Company secretaryfireman. Does not consume alcohol. She is a Futures traderhomemaker. 2 adult children, a son and a daughter.  History of UTI May 2014  Family history: Father died at age 79 pneumonia/COPD-heart problems. Mother died at age 79 of cancer that was metastatic with primary unknown to patient. 2 brothers with history of hypertension. Daughter has hypertension.  Allergy chest positive for trees, grasses, chocolate, green pea's, watermelon, cantaloupe.       Review of Systems situational stress with  husband's illness, anxiety.     Objective:   Physical Exam  Constitutional: She is oriented to person, place, and time. She appears well-developed and well-nourished. No distress.  HENT:  Head: Normocephalic and atraumatic.  Right Ear: External ear normal.  Left Ear: External ear normal.  Mouth/Throat: Oropharynx is clear and moist. No oropharyngeal exudate.  Eyes: Conjunctivae and EOM are normal. Pupils are equal, round, and reactive to light. Right eye exhibits no discharge. Left eye exhibits no discharge. No scleral icterus.  Neck: Neck supple. No JVD present. No thyromegaly present.  Cardiovascular: Normal rate, regular rhythm, normal heart sounds and intact distal pulses.   No murmur heard. Pulmonary/Chest: Effort normal and breath sounds normal. No respiratory distress. She has no wheezes. She has no rales.  Breasts normal female  Abdominal: Soft. Bowel sounds are normal. She exhibits no distension and no mass. There is no tenderness. There is no rebound and no guarding.  Genitourinary:  Deferred status post hysterectomy BSO  Musculoskeletal: Normal range of motion. She exhibits no edema.  Bilateral Heberden's and Bouchard's nodes.  Neurological: She is alert and oriented to person, place, and time. No cranial nerve deficit.  Skin: Skin is warm and dry. No rash noted. She is not diaphoretic.  Psychiatric: She has a normal mood and affect. Her behavior is normal. Judgment and thought content normal.  Vitals reviewed.         Assessment & Plan:  History of polymyalgia rheumatica-resolved  Osteoarthritis of hands  Hypertension-stable  Hypothyroidism-stable on thyroid replacement  Allergic rhinitis  History of urticaria treated with Zyrtec  Anxiety  Hyperlipidemia-not on said he had due to cost. Intolerant of statin  medication. Unfortunately off Zetia, her lipids are not improved. Total cholesterol has increased from 242 to 252 and LDL cholesterol has increased from  140-162.  GE reflux  Osteopenia-intolerant of Fosamax  Plan: Return in one year or as needed. Labs reviewed with her.

## 2015-04-13 ENCOUNTER — Other Ambulatory Visit: Payer: Self-pay

## 2015-04-13 ENCOUNTER — Telehealth: Payer: Self-pay | Admitting: *Deleted

## 2015-04-13 DIAGNOSIS — Z9289 Personal history of other medical treatment: Secondary | ICD-10-CM

## 2015-04-13 LAB — VITAMIN D 25 HYDROXY (VIT D DEFICIENCY, FRACTURES): VIT D 25 HYDROXY: 36 ng/mL (ref 30–100)

## 2015-04-13 NOTE — Telephone Encounter (Signed)
Reviewed lab results with patient.

## 2015-04-28 ENCOUNTER — Ambulatory Visit
Admission: RE | Admit: 2015-04-28 | Discharge: 2015-04-28 | Disposition: A | Discharge status: Home / Self Care | Payer: Medicare Other | Source: Ambulatory Visit

## 2015-04-28 DIAGNOSIS — Z1231 Encounter for screening mammogram for malignant neoplasm of breast: Secondary | ICD-10-CM | POA: Diagnosis not present

## 2015-04-28 DIAGNOSIS — Z9289 Personal history of other medical treatment: Secondary | ICD-10-CM

## 2015-05-25 ENCOUNTER — Ambulatory Visit (INDEPENDENT_AMBULATORY_CARE_PROVIDER_SITE_OTHER): Payer: Medicare Other | Admitting: Internal Medicine

## 2015-05-25 ENCOUNTER — Encounter: Payer: Self-pay | Admitting: Internal Medicine

## 2015-05-25 VITALS — BP 132/64 | HR 76 | Temp 97.6°F | Wt 146.5 lb

## 2015-05-25 DIAGNOSIS — Z8679 Personal history of other diseases of the circulatory system: Secondary | ICD-10-CM

## 2015-05-25 DIAGNOSIS — E785 Hyperlipidemia, unspecified: Secondary | ICD-10-CM | POA: Diagnosis not present

## 2015-05-25 NOTE — Patient Instructions (Signed)
To have carotid artery screening because of calcifications noted on dental x-rays

## 2015-05-25 NOTE — Progress Notes (Signed)
   Subjective:    [name], female    DOB: 05/25/1933, 81 y.o.   MRN: [id number]  HPI 81 year old female went to Dr. Addison Lank recently for some dental work. He made x-ray images of her teeth and found some carotid calcifications. He sent a letter in copy of these x-rays.  She has a history of hyperlipidemia and is statin intolerant. Says she could not afford Zetia so we just monitor her lipid panel.  Review of Systems completely asymptomatic with regard to TIA symptoms. No history of speech impediment, facial weakness or numbness or extremity weakness or numbness     Objective:   Physical Exam  I do not appreciate any carotid bruits. No supraclavicular bruits either.      Assessment & Plan:  Carotid calcifications  Plan: Because this issue is been raised we are going to have patient undergo carotid artery screening. She is totally asymptomatic however.

## 2015-05-27 ENCOUNTER — Telehealth: Payer: Self-pay | Admitting: *Deleted

## 2015-05-27 DIAGNOSIS — I6523 Occlusion and stenosis of bilateral carotid arteries: Secondary | ICD-10-CM

## 2015-05-27 NOTE — Telephone Encounter (Signed)
Patient returned the call.  Gave appointment date/time and instructions to location and drop off to hospital.  Advised that we will call with results following her test.  Patient verbalized understanding of these instructions.

## 2015-05-27 NOTE — Telephone Encounter (Signed)
Patient scheduled for Carotid Doppler at [facility] at 1 pm Monday August 29th 2016. Left message for patient to call here to get appt information.

## 2015-05-31 ENCOUNTER — Ambulatory Visit (HOSPITAL_COMMUNITY)
Admission: RE | Admit: 2015-05-31 | Discharge: 2015-05-31 | Disposition: A | Discharge status: Home / Self Care | Payer: Medicare Other | Source: Ambulatory Visit | Attending: Internal Medicine | Admitting: Internal Medicine

## 2015-05-31 DIAGNOSIS — I6529 Occlusion and stenosis of unspecified carotid artery: Secondary | ICD-10-CM | POA: Diagnosis present

## 2015-05-31 DIAGNOSIS — I6523 Occlusion and stenosis of bilateral carotid arteries: Secondary | ICD-10-CM | POA: Insufficient documentation

## 2015-05-31 NOTE — Progress Notes (Signed)
VASCULAR LAB PRELIMINARY  PRELIMINARY  PRELIMINARY  PRELIMINARY  Carotid duplex completed.    Preliminary report:  Right:  40-59% internal carotid artery stenosis.  Left 1% to 39% ICA stenosis. Bilateral - Vertebral artery flow is antegrade.  [address], RVS 05/31/2015, 1:15 PM

## 2015-06-04 ENCOUNTER — Telehealth: Payer: Self-pay | Admitting: *Deleted

## 2015-06-04 DIAGNOSIS — E785 Hyperlipidemia, unspecified: Secondary | ICD-10-CM

## 2015-06-04 MED ORDER — EZETIMIBE 10 MG PO TABS: 10.0000 mg | ORAL_TABLET | Freq: Every day | ORAL | Status: DC

## 2015-06-04 NOTE — Telephone Encounter (Signed)
Left message for patient to call back to review carotid US results.

## 2015-06-04 NOTE — Telephone Encounter (Signed)
Spoke with patient reviewed recent Carotid study results with patient . Instructed patient to take generic Zetia and ASA 81 mg daily . Patient verbalized understanding. Will repeat Carotid US in 1 year patient will call to remind [place] at that time

## 2015-07-28 ENCOUNTER — Telehealth: Payer: Self-pay | Admitting: Internal Medicine

## 2015-07-28 NOTE — Telephone Encounter (Signed)
Patient wanted to let you know that she just got started on this Zetia about 3 weeks ago.  Husband has been in/out of the hospital a couple of times.  However, she wanted you to know that with her insurance, she STILL had to pay $917 out of pocket for 3 months.  This drug doesn't have a generic and advised the patient that they MAY go generic sometime in 2017.  Is there something we could do for her in the meantime since this is such a financial burden??  She has 3 months of this.    Pharmacy:  [facility] @ [address]

## 2015-07-28 NOTE — Telephone Encounter (Signed)
See if indeed generic not available. She may be able to order on line from [place] with a 90 day written presciption or if she has purchased it, she could take it every other day

## 2015-07-30 NOTE — Telephone Encounter (Signed)
Is there anything else more reasonably priced for the patient to take after she finishes the medication that she has already paid for.

## 2015-07-30 NOTE — Telephone Encounter (Signed)
Verified with the pharmacist that there is no generic coming out for Zetia and that indeed the patient did pay $917.84 out of pocket to take this medication. Please advise on what to tell patient.

## 2015-07-31 NOTE — Telephone Encounter (Signed)
Book OV to discuss lipid management next week

## 2015-08-02 NOTE — Telephone Encounter (Signed)
PT scheduled appt for Fri 08/13/15 @9 :45

## 2015-08-13 ENCOUNTER — Telehealth: Payer: Self-pay | Admitting: Internal Medicine

## 2015-08-13 ENCOUNTER — Encounter: Payer: Self-pay | Admitting: Internal Medicine

## 2015-08-13 ENCOUNTER — Ambulatory Visit (INDEPENDENT_AMBULATORY_CARE_PROVIDER_SITE_OTHER): Payer: Medicare Other | Admitting: Internal Medicine

## 2015-08-13 VITALS — BP 134/78 | HR 80 | Temp 97.8°F | Resp 18 | Ht 63.0 in | Wt 147.0 lb

## 2015-08-13 DIAGNOSIS — E785 Hyperlipidemia, unspecified: Secondary | ICD-10-CM | POA: Diagnosis not present

## 2015-08-13 NOTE — Progress Notes (Signed)
   Subjective:    [name], female    DOB: 1933/05/20, 81 y.o.   MRN: [id number]  HPI Zetia is currently not available is generic. She bought a 3 month supply and it cost about $1000. She was concerned because she had calcifications in her carotid. Thought she needed to take it. Indeed she does have high cholesterol is statin intolerant. However, she simply cannot afford this medication at the present time. There really are no other alternatives presently since she is statin intolerant. We've been through this a number of times.    Review of Systems     Objective:   Physical Exam Not examined. I spent 15 minutes speaking with her today about these issues. She also has a question about taking a calcium supplement. I do not think she needs that but does need a vitamin D supplement. It is okay to consume milk and take vitamin D supplement. She may take the amount of calcium in her multivitamin daily.        Assessment & Plan:  Hyperlipidemia-in July total cholesterol was 252 with an LDL cholesterol of 162. For the present time she'll take Zetia every other day until she runs out of the prescription. She will return in July for physical examination. We did look at her carotids in August and she did not have a significant stenosis of either carotid. We will plan to look at this again in July 2017 at time of her physical.

## 2015-08-13 NOTE — Patient Instructions (Signed)
Takes Zetia every other day. Return in July for physical exam. We will need to do follow-up of her carotids at that time.

## 2015-08-13 NOTE — Telephone Encounter (Signed)
Patient was given written prescription for Zetia in the event that she opts to obtain Rx from [place].  Patient states she'll think about this.  At her age, she's not sure that she needs to take this med.  But, she'll take the script and give it some thought.

## 2015-08-31 ENCOUNTER — Encounter: Payer: Self-pay | Admitting: Internal Medicine

## 2015-09-30 ENCOUNTER — Other Ambulatory Visit: Payer: Self-pay

## 2015-09-30 MED ORDER — LISINOPRIL 5 MG PO TABS: 5.0000 mg | ORAL_TABLET | Freq: Every day | ORAL | Status: DC

## 2015-09-30 MED ORDER — LEVOTHYROXINE SODIUM 75 MCG PO TABS: 75.0000 ug | ORAL_TABLET | Freq: Every day | ORAL | Status: DC

## 2015-09-30 NOTE — Telephone Encounter (Signed)
Per Dr. Lenord FellersBaxley refill 6 months

## 2016-03-27 ENCOUNTER — Other Ambulatory Visit: Payer: Self-pay | Admitting: Internal Medicine

## 2016-04-14 ENCOUNTER — Encounter: Payer: Self-pay | Admitting: Internal Medicine

## 2016-04-14 ENCOUNTER — Ambulatory Visit (INDEPENDENT_AMBULATORY_CARE_PROVIDER_SITE_OTHER): Payer: Medicare Other | Admitting: Internal Medicine

## 2016-04-14 VITALS — BP 134/74 | HR 74 | Temp 97.2°F | Resp 18 | Ht 63.0 in | Wt 147.0 lb

## 2016-04-14 DIAGNOSIS — E559 Vitamin D deficiency, unspecified: Secondary | ICD-10-CM | POA: Diagnosis not present

## 2016-04-14 DIAGNOSIS — M19041 Primary osteoarthritis, right hand: Secondary | ICD-10-CM | POA: Diagnosis not present

## 2016-04-14 DIAGNOSIS — Z8739 Personal history of other diseases of the musculoskeletal system and connective tissue: Secondary | ICD-10-CM

## 2016-04-14 DIAGNOSIS — Z789 Other specified health status: Secondary | ICD-10-CM

## 2016-04-14 DIAGNOSIS — M8580 Other specified disorders of bone density and structure, unspecified site: Secondary | ICD-10-CM

## 2016-04-14 DIAGNOSIS — I10 Essential (primary) hypertension: Secondary | ICD-10-CM

## 2016-04-14 DIAGNOSIS — Z0000 Encounter for general adult medical examination without abnormal findings: Secondary | ICD-10-CM | POA: Diagnosis not present

## 2016-04-14 DIAGNOSIS — Z130 Encounter for screening for diseases of the blood and blood-forming organs and certain disorders involving the immune mechanism: Secondary | ICD-10-CM

## 2016-04-14 DIAGNOSIS — E785 Hyperlipidemia, unspecified: Secondary | ICD-10-CM

## 2016-04-14 DIAGNOSIS — Z889 Allergy status to unspecified drugs, medicaments and biological substances status: Secondary | ICD-10-CM

## 2016-04-14 DIAGNOSIS — E039 Hypothyroidism, unspecified: Secondary | ICD-10-CM | POA: Diagnosis not present

## 2016-04-14 DIAGNOSIS — J309 Allergic rhinitis, unspecified: Secondary | ICD-10-CM | POA: Diagnosis not present

## 2016-04-14 DIAGNOSIS — Z1321 Encounter for screening for nutritional disorder: Secondary | ICD-10-CM | POA: Diagnosis not present

## 2016-04-14 DIAGNOSIS — M19042 Primary osteoarthritis, left hand: Secondary | ICD-10-CM

## 2016-04-14 DIAGNOSIS — F411 Generalized anxiety disorder: Secondary | ICD-10-CM | POA: Diagnosis not present

## 2016-04-14 DIAGNOSIS — F439 Reaction to severe stress, unspecified: Secondary | ICD-10-CM

## 2016-04-14 DIAGNOSIS — Z658 Other specified problems related to psychosocial circumstances: Secondary | ICD-10-CM | POA: Diagnosis not present

## 2016-04-14 LAB — COMPLETE METABOLIC PANEL WITH GFR
ALK PHOS: 62 U/L (ref 33–130)
ALT: 27 U/L (ref 6–29)
AST: 24 U/L (ref 10–35)
Albumin: 4.7 g/dL (ref 3.6–5.1)
BUN: 16 mg/dL (ref 7–25)
CO2: 29 mmol/L (ref 20–31)
CREATININE: 0.82 mg/dL (ref 0.60–0.88)
Calcium: 10.2 mg/dL (ref 8.6–10.4)
Chloride: 101 mmol/L (ref 98–110)
GFR, Est African American: 78 mL/min (ref >=60)
GFR, Est Non African American: 67 mL/min (ref >=60)
GLUCOSE: 93 mg/dL (ref 65–99)
Potassium: 4 mmol/L (ref 3.5–5.3)
SODIUM: 143 mmol/L (ref 135–146)
Total Bilirubin: 0.6 mg/dL (ref 0.2–1.2)
Total Protein: 8.3 g/dL — ABNORMAL HIGH (ref 6.1–8.1)

## 2016-04-14 LAB — CBC WITH DIFFERENTIAL/PLATELET
BASOS ABS: 67 {cells}/uL (ref 0–200)
Basophils Relative: 1 %
EOS PCT: 4 %
Eosinophils Absolute: 268 cells/uL (ref 15–500)
HEMATOCRIT: 40.3 % (ref 35.0–45.0)
HEMOGLOBIN: 13.2 g/dL (ref 11.7–15.5)
LYMPHS ABS: 2278 {cells}/uL (ref 850–3900)
Lymphocytes Relative: 34 %
MCH: 29.7 pg (ref 27.0–33.0)
MCHC: 32.8 g/dL (ref 32.0–36.0)
MCV: 90.8 fL (ref 80.0–100.0)
MONO ABS: 335 {cells}/uL (ref 200–950)
MPV: 8.5 fL (ref 7.5–12.5)
Monocytes Relative: 5 %
Neutro Abs: 3752 cells/uL (ref 1500–7800)
Neutrophils Relative %: 56 %
Platelets: 234 10*3/uL (ref 140–400)
RBC: 4.44 MIL/uL (ref 3.80–5.10)
RDW: 13.7 % (ref 11.0–15.0)
WBC: 6.7 10*3/uL (ref 3.8–10.8)

## 2016-04-14 LAB — LIPID PANEL
Cholesterol: 220 mg/dL — ABNORMAL HIGH (ref 125–200)
HDL: 71 mg/dL (ref >=46)
LDL Cholesterol: 113 mg/dL (ref <130)
Total CHOL/HDL Ratio: 3.1 Ratio (ref <=5.0)
Triglycerides: 181 mg/dL — ABNORMAL HIGH (ref <150)
VLDL: 36 mg/dL — ABNORMAL HIGH (ref <30)

## 2016-04-14 LAB — POCT URINALYSIS DIPSTICK
BILIRUBIN UA: NEGATIVE
GLUCOSE UA: NEGATIVE
Ketones, UA: NEGATIVE
LEUKOCYTES UA: NEGATIVE
Nitrite, UA: NEGATIVE
Protein, UA: NEGATIVE
RBC UA: NEGATIVE
SPEC GRAV UA: 1.01
Urobilinogen, UA: 0.2
pH, UA: 7

## 2016-04-14 LAB — TSH: TSH: 1.41 m[IU]/L

## 2016-04-14 NOTE — Progress Notes (Signed)
Subjective:    [name], female    DOB: 15-Dec-1932, 82 y.o.   MRN: [id number]  HPI 82 year old Female with history of hyperlipidemia and hypothyroidism for health maintenance exam and evaluation of medical issues. Cannot afford Zetia this time. Might be able to afford it if it goes generic. She has a history of polymyalgia rheumatica diagnosed in 2001 has resolved. History of osteoarthritis in hands, allergic rhinitis, osteopenia, GE reflux, hypertension. History of fibrocystic breast disease and is due for mammogram in the near future. She also has a history of anxiety. This seems to surround the help of her husband. He has some memory loss and other medical issues. He now has to wear a catheter chronically or bladder issues. He has fallen. She has had to drive him to the hospital emergently. She's had some issues sleeping that she relates to anxiety and caffeine intake. She tried over-the-counter Unisom only taking 1 tablet but it did not help. She may try 50 mg of Unisom and if that does not help she can call for prescription medication.  She is intolerant of Fosamax causes an adverse reaction. Augmentin causes nausea. Intolerant of statin medications so that is why she had been prescribed Zetia.  Colonoscopy 2011. Immunizations are up-to-date. Had Zostavax vaccine 2011.  Past medical history: 2 surgeries for endometriosis in the remote past. One surgery for endometriosis involving  left salpingo-oophorectomy. During that time there was obstruction of the sigmoid colon which resulted in a left colectomy by Dr. Rolene Course in 1969.  Patient subsequently had hysterectomy and right salpingo-oophorectomy performed by Dr. Mickel Crow. She had an ovarian cyst drained in the 1960s. Had excisional biopsy of the right breast showing fibrocystic breast disease in 1979.  Cataract extraction of the right eye February 2006 in the left April 2006.  History of UTI May 2014.  Over the past  year her general health has been good. She's not had any URI symptoms. Feels okay except for anxiety.  Social history: She is married. Husband is a retired Company secretary. She does not smoke or consume alcohol. She is a Futures trader. 2 adult children, a son and a daughter.  She's had allergy tests that were positive for trees, grasses, chocolate, green peas, watermelon, cantaloupe.  Family history: Father died at age 34 with pneumonia/COPD and heart problems. Mother died at age 54 cancer that was metastatic from an unknown primary. 2 brothers with history of hypertension. Daughter has hypertension and allergies.    Review of Systems     Objective:   Physical Exam  Constitutional: She is oriented to person, place, and time. She appears well-developed and well-nourished. No distress.  HENT:  Head: Normocephalic and atraumatic.  Right Ear: External ear normal.  Left Ear: External ear normal.  Mouth/Throat: Oropharynx is clear and moist. No oropharyngeal exudate.  Eyes: Conjunctivae and EOM are normal. Pupils are equal, round, and reactive to light. Right eye exhibits no discharge. Left eye exhibits no discharge. No scleral icterus.  Neck: Neck supple. No JVD present. No thyromegaly present.  Cardiovascular: Normal rate, normal heart sounds and intact distal pulses.   No murmur heard. Pulmonary/Chest: She has no wheezes. She has no rales.  Breasts normal female without masses  Abdominal: Soft. Bowel sounds are normal. She exhibits no distension and no mass. There is no rebound and no guarding.  Genitourinary:  Deferred status post hysterectomy BSO  Musculoskeletal: She exhibits no edema.  Bilateral Heberden's and Bouchard's nodes  Lymphadenopathy:  She has no cervical adenopathy.  Neurological: She is alert and oriented to person, place, and time. She has normal reflexes. No cranial nerve deficit. Coordination normal.  Skin: Skin is warm. No rash noted. She is not diaphoretic.  Psychiatric:  She has a normal mood and affect. Her behavior is normal. Judgment and thought content normal.  Vitals reviewed.         Assessment & Plan:  History of polymyalgia rheumatica-resolved  Essential hypertension-blood pressure stable on current regimen  Osteoarthritis of hands  Hypothyroidism-TSH pending  Allergic rhinitis  History of urticaria treated with when necessary Zyrtec  History of anxiety  Hyperlipidemia-statin intolerant and not able to afford Zetia  GE reflux  Osteopenia-intolerant of Fosamax  Plan: Continue same medications. Lab work drawn and pending. We  will notify patient upon receipt of results. She is to return in one year or as needed.  Subjective:   Patient presents for Medicare Annual/Subsequent preventive examination.  Review Past Medical/Family/Social: see above   Risk Factors  Current exercise habits: mostly sedentary Dietary issues discussed: low fat low carb  Cardiac risk factors:  Depression Screen  (Note: if answer to either of the following is "Yes", a more complete depression screening is indicated)   Over the past two weeks, have you felt down, depressed or hopeless? No  Over the past two weeks, have you felt little interest or pleasure in doing things? No Have you lost interest or pleasure in daily life? No Do you often feel hopeless? No Do you cry easily over simple problems? No   Activities of Daily Living  In your present state of health, do you have any difficulty performing the following activities?:   Driving? No  Managing money? No  Feeding yourself? No  Getting from bed to chair? No  Climbing a flight of stairs? No  Preparing food and eating?: No  Bathing or showering? No  Getting dressed: No  Getting to the toilet? No  Using the toilet:No  Moving around from place to place: No  In the past year have you fallen or had a near fall?:No  Are you sexually active? No  Do you have more than one partner? No   Hearing  Difficulties: No  Do you often ask people to speak up or repeat themselves? No  Do you experience ringing or noises in your ears? No  Do you have difficulty understanding soft or whispered voices? No  Do you feel that you have a problem with memory? No Do you often misplace items? No    Home Safety:  Do you have a smoke alarm at your residence? Yes Do you have grab bars in the bathroom? yes Do you have throw rugs in your house?no   Cognitive Testing  Alert? Yes Normal Appearance?Yes  Oriented to person? Yes Place? Yes  Time? Yes  Recall of three objects? Yes  Can perform simple calculations? Yes  Displays appropriate judgment?Yes  Can read the correct time from a watch face?Yes   List the Names of Other Physician/Practitioners you currently use:  See referral list for the physicians patient is currently seeing.  Ophthalmologist   Review of Systems: See above   Objective:     General appearance: Appears stated age and mildly obese  Head: Normocephalic, without obvious abnormality, atraumatic  Eyes: conj clear, EOMi PEERLA  Ears: normal TM's and external ear canals both ears  Nose: Nares normal. Septum midline. Mucosa normal. No drainage or sinus tenderness.  Throat: lips, mucosa,  and tongue normal; teeth and gums normal  Neck: no adenopathy, no carotid bruit, no JVD, supple, symmetrical, trachea midline and thyroid not enlarged, symmetric, no tenderness/mass/nodules  No CVA tenderness.  Lungs: clear to auscultation bilaterally  Breasts: normal appearance, no masses or tenderness Heart: regular rate and rhythm, S1, S2 normal, no murmur, click, rub or gallop  Abdomen: soft, non-tender; bowel sounds normal; no masses, no organomegaly  Musculoskeletal: ROM normal in all joints, no crepitus, no deformity, Normal muscle strengthen. Back  is symmetric, no curvature. Skin: Skin color, texture, turgor normal. No rashes or lesions  Lymph nodes: Cervical, supraclavicular, and  axillary nodes normal.  Neurologic: CN 2 -12 Normal, Normal symmetric reflexes. Normal coordination and gait  Psych: Alert & Oriented x 3, Mood appear stable.    Assessment:    Annual wellness medicare exam   Plan:    During the course of the visit the patient was educated and counseled about appropriate screening and preventive services including:   Annual mammogram  Annual flu vaccine     Patient Instructions (the written plan) was given to the patient.  Medicare Attestation  I have personally reviewed:  The patient's medical and social history  Their use of alcohol, tobacco or illicit drugs  Their current medications and supplements  The patient's functional ability including ADLs,fall risks, home safety risks, cognitive, and hearing and visual impairment  Diet and physical activities  Evidence for depression or mood disorders  The patient's weight, height, BMI, and visual acuity have been recorded in the chart. I have made referrals, counseling, and provided education to the patient based on review of the above and I have provided the patient with a written personalized care plan for preventive services.

## 2016-04-14 NOTE — Patient Instructions (Signed)
It was a pleasure to see you today. Lab work pending. Continue same medications and return in one year or as needed. Have annual mammogram in the near future.

## 2016-04-15 LAB — VITAMIN D 25 HYDROXY (VIT D DEFICIENCY, FRACTURES): VIT D 25 HYDROXY: 38 ng/mL (ref 30–100)

## 2016-04-16 ENCOUNTER — Emergency Department (HOSPITAL_COMMUNITY)
Admission: EM | Admit: 2016-04-16 | Discharge: 2016-04-16 | Disposition: A | Discharge status: Home / Self Care | Payer: Medicare Other | Attending: Emergency Medicine | Admitting: Emergency Medicine

## 2016-04-16 ENCOUNTER — Emergency Department (HOSPITAL_COMMUNITY): Payer: Medicare Other

## 2016-04-16 ENCOUNTER — Encounter (HOSPITAL_COMMUNITY): Payer: Self-pay | Admitting: Emergency Medicine

## 2016-04-16 DIAGNOSIS — K8020 Calculus of gallbladder without cholecystitis without obstruction: Secondary | ICD-10-CM | POA: Diagnosis not present

## 2016-04-16 DIAGNOSIS — Z79899 Other long term (current) drug therapy: Secondary | ICD-10-CM | POA: Diagnosis not present

## 2016-04-16 DIAGNOSIS — I10 Essential (primary) hypertension: Secondary | ICD-10-CM | POA: Diagnosis not present

## 2016-04-16 DIAGNOSIS — R109 Unspecified abdominal pain: Secondary | ICD-10-CM | POA: Diagnosis present

## 2016-04-16 LAB — CBC WITH DIFFERENTIAL/PLATELET
Basophils Absolute: 0 10*3/uL (ref 0.0–0.1)
Basophils Relative: 1 %
Eosinophils Absolute: 0.3 10*3/uL (ref 0.0–0.7)
Eosinophils Relative: 3 %
HEMATOCRIT: 39.4 % (ref 36.0–46.0)
HEMOGLOBIN: 12.6 g/dL (ref 12.0–15.0)
LYMPHS ABS: 2.2 10*3/uL (ref 0.7–4.0)
LYMPHS PCT: 27 %
MCH: 29.9 pg (ref 26.0–34.0)
MCHC: 32 g/dL (ref 30.0–36.0)
MCV: 93.4 fL (ref 78.0–100.0)
MONO ABS: 0.3 10*3/uL (ref 0.1–1.0)
MONOS PCT: 4 %
NEUTROS ABS: 5.2 10*3/uL (ref 1.7–7.7)
NEUTROS PCT: 65 %
Platelets: 208 10*3/uL (ref 150–400)
RBC: 4.22 MIL/uL (ref 3.87–5.11)
RDW: 13.4 % (ref 11.5–15.5)
WBC: 8 10*3/uL (ref 4.0–10.5)

## 2016-04-16 LAB — I-STAT TROPONIN, ED: TROPONIN I, POC: 0 ng/mL (ref 0.00–0.08)

## 2016-04-16 LAB — URINE MICROSCOPIC-ADD ON

## 2016-04-16 LAB — COMPREHENSIVE METABOLIC PANEL
ALBUMIN: 4.1 g/dL (ref 3.5–5.0)
ALT: 28 U/L (ref 14–54)
AST: 24 U/L (ref 15–41)
Alkaline Phosphatase: 62 U/L (ref 38–126)
Anion gap: 6 (ref 5–15)
BUN: 19 mg/dL (ref 6–20)
CHLORIDE: 105 mmol/L (ref 101–111)
CO2: 28 mmol/L (ref 22–32)
Calcium: 10.4 mg/dL — ABNORMAL HIGH (ref 8.9–10.3)
Creatinine, Ser: 0.89 mg/dL (ref 0.44–1.00)
GFR calc Af Amer: >60 mL/min (ref >60)
GFR, EST NON AFRICAN AMERICAN: 59 mL/min — AB (ref >60)
Glucose, Bld: 138 mg/dL — ABNORMAL HIGH (ref 65–99)
POTASSIUM: 4.1 mmol/L (ref 3.5–5.1)
SODIUM: 139 mmol/L (ref 135–145)
Total Bilirubin: 0.5 mg/dL (ref 0.3–1.2)
Total Protein: 7.8 g/dL (ref 6.5–8.1)

## 2016-04-16 LAB — URINALYSIS, ROUTINE W REFLEX MICROSCOPIC
Bilirubin Urine: NEGATIVE
Glucose, UA: NEGATIVE mg/dL
HGB URINE DIPSTICK: NEGATIVE
Ketones, ur: NEGATIVE mg/dL
Nitrite: NEGATIVE
PH: 5 (ref 5.0–8.0)
Protein, ur: NEGATIVE mg/dL
SPECIFIC GRAVITY, URINE: 1.021 (ref 1.005–1.030)

## 2016-04-16 LAB — LIPASE, BLOOD: LIPASE: 25 U/L (ref 11–51)

## 2016-04-16 MED ORDER — MORPHINE SULFATE (PF) 4 MG/ML IV SOLN
4.0000 mg | Freq: Once | INTRAVENOUS | Status: AC
Start: 2016-04-16 — End: 2016-04-16
  Administered 2016-04-16: 4 mg via INTRAVENOUS
  Filled 2016-04-16: qty 1

## 2016-04-16 MED ORDER — ONDANSETRON 4 MG PO TBDP: ORAL_TABLET | ORAL | Status: DC

## 2016-04-16 MED ORDER — HYDROCODONE-ACETAMINOPHEN 5-325 MG PO TABS
1.0000 | ORAL_TABLET | ORAL | Status: DC | PRN
Start: 2016-04-16 — End: 2016-05-09

## 2016-04-16 MED ORDER — ONDANSETRON HCL 4 MG/2ML IJ SOLN
4.0000 mg | Freq: Once | INTRAMUSCULAR | Status: AC
  Administered 2016-04-16: 4 mg via INTRAVENOUS
  Filled 2016-04-16: qty 2

## 2016-04-16 NOTE — Discharge Instructions (Signed)
You also have a possible polyp or growth in your gallbladder.  It is recommended that you have an outpatient MR/MRCP of your gallbladder.  This can be arranged by the surgery team.  Cholelithiasis Cholelithiasis (also called gallstones) is a form of gallbladder disease in which gallstones form in your gallbladder. The gallbladder is an organ that stores bile made in the liver, which helps digest fats. Gallstones begin as small crystals and slowly grow into stones. Gallstone pain occurs when the gallbladder spasms and a gallstone is blocking the duct. Pain can also occur when a stone passes out of the duct.  RISK FACTORS  Being female.   Having multiple pregnancies. Health care providers sometimes advise removing diseased gallbladders before future pregnancies.   Being obese.  Eating a diet heavy in fried foods and fat.   Being older than 60 years and increasing age.   Prolonged use of medicines containing female hormones.   Having diabetes mellitus.   Rapidly losing weight.   Having a family history of gallstones (heredity).  SYMPTOMS  Nausea.   Vomiting.  Abdominal pain.   Yellowing of the skin (jaundice).   Sudden pain. It may persist from several minutes to several hours.  Fever.   Tenderness to the touch. In some cases, when gallstones do not move into the bile duct, people have no pain or symptoms. These are called "silent" gallstones.  TREATMENT Silent gallstones do not need treatment. In severe cases, emergency surgery may be required. Options for treatment include:  Surgery to remove the gallbladder. This is the most common treatment.  Medicines. These do not always work and may take 6-12 months or more to work.  Shock wave treatment (extracorporeal biliary lithotripsy). In this treatment an ultrasound machine sends shock waves to the gallbladder to break gallstones into smaller pieces that can pass into the intestines or be dissolved by  medicine. HOME CARE INSTRUCTIONS   Only take over-the-counter or prescription medicines for pain, discomfort, or fever as directed by your health care provider.   Follow a low-fat diet until seen again by your health care provider. Fat causes the gallbladder to contract, which can result in pain.   Follow up with your health care provider as directed. Attacks are almost always recurrent and surgery is usually required for permanent treatment.  SEEK IMMEDIATE MEDICAL CARE IF:   Your pain increases and is not controlled by medicines.   You have a fever or persistent symptoms for more than 2-3 days.   You have a fever and your symptoms suddenly get worse.   You have persistent nausea and vomiting.  MAKE SURE YOU:   Understand these instructions.  Will watch your condition.  Will get help right away if you are not doing well or get worse.   This information is not intended to replace advice given to you by your health care provider. Make sure you discuss any questions you have with your health care provider.   Document Released: 09/14/2005 Document Revised: 05/21/2013 Document Reviewed: 03/12/2013 Elsevier Interactive Patient Education [employer]2016 Elsevier Inc.

## 2016-04-16 NOTE — ED Notes (Signed)
Patient left at this time with all belongings, refused wheelchair. 

## 2016-04-16 NOTE — ED Provider Notes (Addendum)
CSN: [id number]     Arrival date & time 04/16/16  0113 History  By signing my name below, I, Sonum Patel, attest that this documentation has been prepared under the direction and in the presence of Rolan Bucco, MD. Electronically Signed: Sonum Patel, Scribe. 04/16/2016. 1:53 AM.    Chief Complaint  Patient presents with  . Abdominal Pain    The history is provided by the patient. No language interpreter was used.     HPI Comments: Dominique Livingston is a 82 y.o. female with past medical history of HTN, hyperlipidemia, abdominal hysterectomy, left colectomy, GERD, IBS who presents to the Emergency Department complaining of 4 hours of sudden onset, constant right sided flank pain with radiation to the RUQ. She denies similar symptoms in the past. She reports associated nausea and vomiting. She has taken Tums but was not able to tolerate them. She had a tomato and cheese sandwich for dinner. She denies dysuria, hematuria, diarrhea, fever. She denies history of kidney stones.   Past Medical History  Diagnosis Date  . Allergy   . Fibrocystic breast disease   . Hyperlipidemia   . Thyroid disease     hypothyroidism  . IBS (irritable bowel syndrome)   . Anxiety   . GERD (gastroesophageal reflux disease)   . Hypertension   . Polymyalgia rheumatica (HCC)    Past Surgical History  Procedure Laterality Date  . Breast surgery      biopsy rt breast  . Left colectomy    . Abdominal hysterectomy    . Eye surgery      cataracts   Family History  Problem Relation Age of Onset  . Cancer Mother   . Heart disease Father   . COPD Father   . Hypertension Daughter    Social History  Substance Use Topics  . Smoking status: Never Smoker   . Smokeless tobacco: Never Used  . Alcohol Use: No   OB History    No data available     Review of Systems  Constitutional: Negative for fever, chills, diaphoresis and fatigue.  HENT: Negative for congestion, rhinorrhea and sneezing.   Eyes: Negative.    Respiratory: Negative for cough, chest tightness and shortness of breath.   Cardiovascular: Negative for chest pain and leg swelling.  Gastrointestinal: Positive for abdominal pain. Negative for nausea, vomiting, diarrhea and blood in stool.  Genitourinary: Positive for flank pain. Negative for dysuria, frequency, hematuria and difficulty urinating.  Musculoskeletal: Negative for back pain and arthralgias.  Skin: Negative for rash.  Neurological: Negative for dizziness, speech difficulty, weakness, numbness and headaches.      Allergies  Amoxicillin-pot clavulanate and Erythromycin  Home Medications   Prior to Admission medications   Medication Sig Start Date End Date Taking? Authorizing Provider  acetaminophen (TYLENOL) 500 MG tablet Take 1,000 mg by mouth every 6 (six) hours as needed.   Yes Historical Provider, MD  Cholecalciferol (VITAMIN D PO) Take 1 tablet by mouth daily.   Yes Historical Provider, MD  levothyroxine (SYNTHROID, LEVOTHROID) 75 MCG tablet TAKE ONE TABLET BY MOUTH ONCE DAILY 03/27/16  Yes Margaree Mackintosh, MD  lisinopril (PRINIVIL,ZESTRIL) 5 MG tablet TAKE ONE TABLET BY MOUTH ONCE DAILY 03/27/16  Yes Margaree Mackintosh, MD  Multiple Vitamin (MULTIVITAMIN) tablet Take 1 tablet by mouth daily.   Yes Historical Provider, MD  ezetimibe (ZETIA) 10 MG tablet Take 1 tablet (10 mg total) by mouth daily. Patient not taking: Reported on 04/14/2016 06/04/15   Luanna Cole  Baxley, MD  HYDROcodone-acetaminophen (NORCO/VICODIN) 5-325 MG tablet Take 1-2 tablets by mouth every 4 (four) hours as needed. 04/16/16   Rolan Bucco, MD  ondansetron (ZOFRAN ODT) 4 MG disintegrating tablet 4mg  ODT q4 hours prn nausea/vomit 04/16/16   Rolan Bucco, MD   BP 139/65 mmHg  Pulse 76  Temp(Src) 98.6 F (37 C) (Oral)  Resp 16  Ht 5\' 3"  (1.6 m)  Wt 147 lb (66.679 kg)  BMI 26.05 kg/m2  SpO2 98% Physical Exam  Constitutional: She is oriented to person, place, and time. She appears well-developed and  well-nourished.  HENT:  Head: Normocephalic and atraumatic.  Eyes: Pupils are equal, round, and reactive to light.  Neck: Normal range of motion. Neck supple.  Cardiovascular: Normal rate, regular rhythm and normal heart sounds.   Pulmonary/Chest: Effort normal and breath sounds normal. No respiratory distress. She has no wheezes. She has no rales. She exhibits no tenderness.  Abdominal: Soft. Bowel sounds are normal. There is tenderness. There is no rebound and no guarding.  Mild tenderness to the RUQ and right flank  Musculoskeletal: Normal range of motion. She exhibits no edema.  Lymphadenopathy:    She has no cervical adenopathy.  Neurological: She is alert and oriented to person, place, and time.  Skin: Skin is warm and dry. No rash noted.  Psychiatric: She has a normal mood and affect.  Nursing note and vitals reviewed.   ED Course  Procedures (including critical care time)  DIAGNOSTIC STUDIES: Oxygen Saturation is 97% on RA, normal by my interpretation.    COORDINATION OF CARE: 2:04 AM Will order lab work. Discussed treatment plan with pt at bedside and pt agreed to plan.   Labs Review Labs Reviewed  COMPREHENSIVE METABOLIC PANEL - Abnormal; Notable for the following:    Glucose, Bld 138 (*)    Calcium 10.4 (*)    GFR calc non Af Amer 59 (*)    All other components within normal limits  URINALYSIS, ROUTINE W REFLEX MICROSCOPIC (NOT AT [facility]) - Abnormal; Notable for the following:    APPearance CLOUDY (*)    Leukocytes, UA MODERATE (*)    All other components within normal limits  URINE MICROSCOPIC-ADD ON - Abnormal; Notable for the following:    Squamous Epithelial / LPF 6-30 (*)    Bacteria, UA FEW (*)    All other components within normal limits  CBC WITH DIFFERENTIAL/PLATELET  LIPASE, BLOOD  I-STAT TROPOININ, ED    Imaging Review US Abdomen Complete  04/16/2016  CLINICAL DATA:  Epigastric pain and back pain. Nausea and bloating. Symptoms since 10 p.m. EXAM:  ABDOMEN ULTRASOUND COMPLETE COMPARISON:  None. FINDINGS: Gallbladder: Large stone in the gallbladder measuring about 3.3 cm maximal dimension. Adjacent filling defect in the gallbladder may represent tumefactive sludge but there appears to be flow within this area on color flow Doppler imaging. A polypoid lesion should be excluded. Based on size greater than 1 cm, this would be concerning for malignancy. Suggest follow-up with MRI/ MRCP in the elective setting. No gallbladder wall thickening. Mild pericholecystic edema is demonstrated. Murphy's sign is negative. Common bile duct: Diameter: 7.8 mm, representing upper limits of normal. Liver: Mild diffuse increased parenchymal echotexture suggesting fatty infiltration with focal sparing adjacent to the gallbladder fossa. IVC: No abnormality visualized. Pancreas: Pancreatic duct is at the upper limits of normal diameter measuring 2.6 mm. No focal pancreatic mass lesions identified in the visualized pancreas. Portions of the tail are obscured by bowel gas. Spleen: Size and  appearance within normal limits. Right Kidney: Length: 10.6 cm. Echogenicity within normal limits. No mass or hydronephrosis visualized. Left Kidney: Length: 11 cm. Echogenicity within normal limits. No mass or hydronephrosis visualized. Abdominal aorta: No aneurysm. Calcifications consistent with aortic atherosclerosis. Other findings: None. IMPRESSION: Cholelithiasis with a large stone in the gallbladder. Mild pericholecystic edema. Murphy's sign is negative. Mass versus tumefactive sludge also demonstrated within the gallbladder. Suggest MRI/MRCP for follow-up. Fatty infiltration of the liver. Prominent pancreatic duct may indicate inflammatory change. No obstructing lesions identified. Electronically Signed   By: Burman NievesWilliam  Stevens M.D.   On: 04/16/2016 04:47   I have personally reviewed and evaluated these images and lab results as part of my medical decision-making.   EKG  Interpretation   Date/Time:  Sunday April 16 2016 01:25:58 EDT Ventricular Rate:  78 PR Interval:  172 QRS Duration: 78 QT Interval:  388 QTC Calculation: 442 R Axis:   77 Text Interpretation:  Normal sinus rhythm Normal ECG No old tracing to  compare Confirmed by Tashonna Descoteaux  MD, Madylyn Insco ([id number]) on 04/16/2016 1:39:45 AM      MDM   Final diagnoses:  Gallstone   Patient's ultrasound is reviewed. I spoke with Dr. Lindie SpruceWyatt with general surgery who feels comfortable with the patient being discharged home. She has no fever. Her white count is normal. Her LFTs are normal. There is no evidence of pancreatitis. Her pain is controlled in the ED after 1 dose of morphine. She was discharged home in good condition. She was advised to use a nonfat diet. She was given prescription for Vicodin and Zofran for symptomatically. She was advised that she needs to have close follow-up with general surgery and was given contact information to follow-up with them. She was advised that she will need a follow-up MR/MRCP to further evaluate her gallbladder.  Her urinalysis has leukocytes and bacteria but there is also a fair amount of squamous cells and she's not symptomatic for UTI. I Jamesetta Sohyllis is a contaminated specimen.  I personally performed the services described in this documentation, which was scribed in my presence.  The recorded information has been reviewed and considered.    Rolan BuccoMelanie Eliane Hammersmith, MD 04/16/16 [id number]  Rolan BuccoMelanie Ismail Graziani, MD 04/16/16 [phone]

## 2016-04-16 NOTE — ED Notes (Signed)
Pt st's she started having RUQ pain and pain in mid upper back.  Pt also st's she became nauseated and vomited x's 1.

## 2016-04-20 ENCOUNTER — Ambulatory Visit: Payer: Self-pay | Admitting: General Surgery

## 2016-04-20 DIAGNOSIS — K8020 Calculus of gallbladder without cholecystitis without obstruction: Secondary | ICD-10-CM | POA: Diagnosis not present

## 2016-04-20 NOTE — H&P (Signed)
Dominique Livingston 04/20/2016 1:27 PM Location: [facility] Patient #: [id number] DOB: 29-Mar-1933 Married / Language: English / Race: Asian Female  History of Present Illness Adolph Pollack(Aldan Camey J. Dainelle Hun MD; 04/20/2016 2:15 PM) The patient is a 82 year old female.   Note:She is referred by Dr. Fredderick PhenixBelfi, EDP, for consultation regarding symptomatic cholelithiasis. She is here with her daughter. She was in her normal state of health until a few nights ago. She had a milkshake in the afternoon than a tomato and cheese sandwich. Following that she had some significant right upper quadrant and right flank pain that escalated. It was associated with some nausea and vomiting. She presented to the emergency department and was noted to have, normal LFTs, normal WBC and lipase, and an ultrasound which showed the following:  IMPRESSION: Cholelithiasis with a large stone in the gallbladder. Mild pericholecystic edema. Murphy's sign is negative. Mass versus tumefactive sludge also demonstrated within the gallbladder. Suggest MRI/MRCP for follow-up. Fatty infiltration of the liver. Prominent pancreatic duct may indicate inflammatory change. No obstructing lesions identified.  She has not had any other episodes of pain since her time in the emergency room 4 days ago.  Allergies Fay Records(Ashley Beck, CMA; 04/20/2016 1:28 PM) Augmentin *PENICILLINS* Erythromycin *OPHTHALMIC AGENTS*  Medication History Fay Records(Ashley Beck, CMA; 04/20/2016 1:29 PM) Tylenol (500MG  Capsule, Oral as needed) Active. Multiple Vitamin (Oral) Active. Zofran (4MG  Tablet, Oral) Active. Levothyroxine Sodium (88MCG Capsule, Oral) Active. Lisinopril (10MG  Tablet, Oral) Active. Medications Reconciled     Review of Systems Fay Records(Ashley Beck CMA; 04/20/2016 1:27 PM) General Not Present- Appetite Loss, Chills, Fatigue, Fever, Night Sweats, Weight Gain and Weight Loss. Skin Not Present- Change in Wart/Mole, Dryness, Hives, Jaundice, New  Lesions, Non-Healing Wounds, Rash and Ulcer. HEENT Present- Seasonal Allergies. Not Present- Earache, Hearing Loss, Hoarseness, Nose Bleed, Oral Ulcers, Ringing in the Ears, Sinus Pain, Sore Throat, Visual Disturbances, Wears glasses/contact lenses and Yellow Eyes. Respiratory Not Present- Bloody sputum, Chronic Cough, Difficulty Breathing, Snoring and Wheezing. Breast Not Present- Breast Mass, Breast Pain, Nipple Discharge and Skin Changes. Gastrointestinal Present- Nausea. Not Present- Abdominal Pain, Bloating, Bloody Stool, Change in Bowel Habits, Chronic diarrhea, Constipation, Difficulty Swallowing, Excessive gas, Gets full quickly at meals, Hemorrhoids, Indigestion, Rectal Pain and Vomiting. Female Genitourinary Not Present- Frequency, Nocturia, Painful Urination, Pelvic Pain and Urgency. Musculoskeletal Not Present- Back Pain, Joint Pain, Joint Stiffness, Muscle Pain, Muscle Weakness and Swelling of Extremities. Neurological Not Present- Decreased Memory, Fainting, Headaches, Numbness, Seizures, Tingling, Tremor, Trouble walking and Weakness. Psychiatric Not Present- Anxiety, Bipolar, Change in Sleep Pattern, Depression, Fearful and Frequent crying. Endocrine Not Present- Cold Intolerance, Excessive Hunger, Hair Changes, Heat Intolerance, Hot flashes and New Diabetes. Hematology Not Present- Blood Thinners, Easy Bruising, Excessive bleeding, Gland problems, HIV and Persistent Infections.  Vitals Fay Records(Ashley Beck CMA; 04/20/2016 1:29 PM) 04/20/2016 1:29 PM Weight: 144.6 lb Height: 63in Body Surface Area: 1.68 m Body Mass Index: 25.61 kg/m  Temp.: 97.72F(Temporal)  Pulse: 88 (Regular)  BP: 124/82 (Sitting, Left Arm, Standard)      Physical Exam Adolph Pollack(Laquinton Bihm J. Momoka Stringfield MD; 04/20/2016 2:13 PM)  The physical exam findings are as follows: Note:General: WDWN in NAD. Pleasant and cooperative.  HEENT: [place]/AT, no external nasal or ear masses, mucous membranes are moist  EYES: EOMI, no  scleral icterus, pupils normal  NECK: Supple, no obvious mass or thyroid mass/enlargement, no trachea deviation  CV: RRR, no murmur, no edema  CHEST: Breath sounds equal and clear. Respirations nonlabored.  ABDOMEN: Soft, nontender, nondistended, no masses, no  organomegaly, long midline scar  SKIN: No jaundice or suspicious rashes.  NEUROLOGIC: Alert and oriented, answers questions appropriately.  PSYCHIATRIC: Normal mood, affect , and behavior.    Assessment & Plan Adolph Pollack(Zylie Mumaw J. Dereke Neumann MD; 04/20/2016 2:14 PM)  SYMPTOMATIC CHOLELITHIASIS (K80.20) Impression: We discussed options for treatment including expected management versus cholecystectomy. Given the size of the gallstone as well as her symptomatic bout and the question of sludge versus something else and the gallbladder I recommend proceeding with laparoscopic possible open cholecystectomy and she agrees with the recommendation.  Plan: Laparoscopic possible open cholecystectomy with cholangiogram. Strict low-fat diet. I have explained the procedure, risks, and aftercare of cholecystectomy. Risks include but are not limited to bleeding, infection, wound problems, anesthesia, diarrhea, bile leak, injury to common bile duct/liver/intestine. She seems to understand and agrees to proceed.  Avel Peaceodd Jamice Carreno, MD

## 2016-05-02 ENCOUNTER — Encounter (HOSPITAL_COMMUNITY): Payer: Self-pay

## 2016-05-02 NOTE — Pre-Procedure Instructions (Signed)
PRAJNA THRONEBERRY  05/02/2016      [facility] - [address] ([address]), [place] - [address] [place] ([place]) [place] [address] Phone: [phone] Fax: [phone]    Your procedure is scheduled on Tuesday, May 09, 2016  Report to [facility] Admitting at 7:15 A.M.  Call this number if you have problems the morning of surgery:  [phone]   Remember:  Do not eat food or drink liquids after midnight Monday, May 08, 2016  Take these medicines the morning of surgery with A SIP OF WATER : if needed: Ondansetron ( Zofran ) for nausea/ vomiting, Hydrocodone for pain  Stop taking Aspirin ( on 8/4) , vitamins, fish oil and herbal medications. Do not take any NSAIDs ie: Ibuprofen, Advil, Naproxen, BC and Goody Powder or any medication containing Aspirin; stop now.  Do not wear jewelry, make-up or nail polish.  Do not wear lotions, powders, or perfumes.  You may not wear deoderant.  Do not shave 48 hours prior to surgery.    Do not bring valuables to the hospital.  [facility] is not responsible for any belongings or valuables.  Contacts, dentures or bridgework may not be worn into surgery.  Leave your suitcase in the car.  After surgery it may be brought to your room.  For patients admitted to the hospital, discharge time will be determined by your treatment team.  Patients discharged the day of surgery will not be allowed to drive home.   Name and phone number of your driver:  Special instructions:   [place] - Preparing for Surgery  Before surgery, you can play an important role.  Because skin is not sterile, your skin needs to be as free of germs as possible.  You can reduce the number of germs on you skin by washing with CHG (chlorahexidine gluconate) soap before surgery.  CHG is an antiseptic cleaner which kills germs and bonds with the skin to continue killing germs even after washing.  Please DO NOT use if you have an allergy to CHG or  antibacterial soaps.  If your skin becomes reddened/irritated stop using the CHG and inform your nurse when you arrive at Short Stay.  Do not shave (including legs and underarms) for at least 48 hours prior to the first CHG shower.  You may shave your face.  Please follow these instructions carefully:   1.  Shower with CHG Soap the night before surgery and the morning of Surgery.  2.  If you choose to wash your hair, wash your hair first as usual with your normal shampoo.  3.  After you shampoo, rinse your hair and body thoroughly to remove the Shampoo.  4.  Use CHG as you would any other liquid soap.  You can apply chg directly  to the skin and wash gently with scrungie or a clean washcloth.  5.  Apply the CHG Soap to your body ONLY FROM THE NECK DOWN.  Do not use on open wounds or open sores.  Avoid contact with your eyes, ears, mouth and genitals (private parts).  Wash genitals (private parts) with your normal soap.  6.  Wash thoroughly, paying special attention to the area where your surgery will be performed.  7.  Thoroughly rinse your body with warm water from the neck down.  8.  DO NOT shower/wash with your normal soap after using and rinsing off the CHG Soap.  9.  Pat yourself dry  with a clean towel.            10.  Wear clean pajamas.            11.  Place clean sheets on your bed the night of your first shower and do not sleep with pets.  Day of Surgery  Do not apply any lotions/deodorants the morning of surgery.  Please wear clean clothes to the hospital/surgery center.  Please read over the following fact sheets that you were given. Pain Booklet, Coughing and Deep Breathing and Surgical Site Infection Prevention

## 2016-05-03 ENCOUNTER — Encounter (HOSPITAL_COMMUNITY)
Admission: RE | Admit: 2016-05-03 | Discharge: 2016-05-03 | Disposition: A | Discharge status: Home / Self Care | Payer: Medicare Other | Source: Ambulatory Visit | Attending: General Surgery | Admitting: General Surgery

## 2016-05-03 ENCOUNTER — Encounter (HOSPITAL_COMMUNITY): Payer: Self-pay

## 2016-05-03 DIAGNOSIS — K8020 Calculus of gallbladder without cholecystitis without obstruction: Secondary | ICD-10-CM | POA: Diagnosis not present

## 2016-05-03 DIAGNOSIS — Z01812 Encounter for preprocedural laboratory examination: Secondary | ICD-10-CM | POA: Diagnosis not present

## 2016-05-03 HISTORY — DX: Unspecified osteoarthritis, unspecified site: M19.90

## 2016-05-03 LAB — CBC
HCT: 42.7 % (ref 36.0–46.0)
Hemoglobin: 13.4 g/dL (ref 12.0–15.0)
MCH: 29.5 pg (ref 26.0–34.0)
MCHC: 31.4 g/dL (ref 30.0–36.0)
MCV: 94.1 fL (ref 78.0–100.0)
PLATELETS: 206 10*3/uL (ref 150–400)
RBC: 4.54 MIL/uL (ref 3.87–5.11)
RDW: 13 % (ref 11.5–15.5)
WBC: 6.7 10*3/uL (ref 4.0–10.5)

## 2016-05-03 LAB — BASIC METABOLIC PANEL
ANION GAP: 11 (ref 5–15)
BUN: 13 mg/dL (ref 6–20)
CALCIUM: 10.7 mg/dL — AB (ref 8.9–10.3)
CO2: 26 mmol/L (ref 22–32)
CREATININE: 0.78 mg/dL (ref 0.44–1.00)
Chloride: 103 mmol/L (ref 101–111)
Glucose, Bld: 101 mg/dL — ABNORMAL HIGH (ref 65–99)
Potassium: 4.1 mmol/L (ref 3.5–5.1)
SODIUM: 140 mmol/L (ref 135–145)

## 2016-05-03 NOTE — Progress Notes (Signed)
Pt. Followed by Dr. Lenord Fellers for PCP. Pt. Reports that she just recently had a well checkup & told that she doesn't need to return for one yr. Pt. Denies all chest concerns today. EKG recently done at ED visit for gallbladder distress.

## 2016-05-08 NOTE — Anesthesia Preprocedure Evaluation (Signed)
Anesthesia Evaluation  Patient identified by MRN, date of birth, ID band Patient awake    Reviewed: Allergy & Precautions, H&P , Patient's Chart, lab work & pertinent test results, reviewed documented beta blocker date and time   Airway Mallampati: II  TM Distance: >3 FB Neck ROM: full    Dental no notable dental hx.    Pulmonary    Pulmonary exam normal breath sounds clear to auscultation       Cardiovascular hypertension,  Rhythm:regular Rate:Normal     Neuro/Psych    GI/Hepatic GERD  ,  Endo/Other  Hypothyroidism   Renal/GU      Musculoskeletal   Abdominal   Peds  Hematology   Anesthesia Other Findings HTN..... NSR  Reproductive/Obstetrics                             Anesthesia Physical Anesthesia Plan  ASA: II  Anesthesia Plan: General   Post-op Pain Management:    Induction: Intravenous  Airway Management Planned: Oral ETT  Additional Equipment:   Intra-op Plan:   Post-operative Plan: Extubation in OR  Informed Consent: I have reviewed the patients History and Physical, chart, labs and discussed the procedure including the risks, benefits and alternatives for the proposed anesthesia with the patient or authorized representative who has indicated his/her understanding and acceptance.   Dental Advisory Given and Dental advisory given  Plan Discussed with: CRNA and Surgeon  Anesthesia Plan Comments: (  Discussed general anesthesia, including possible nausea, instrumentation of airway, sore throat,pulmonary aspiration, etc. I asked if the were any outstanding questions, or  concerns before we proceeded. )        Anesthesia Quick Evaluation

## 2016-05-09 ENCOUNTER — Ambulatory Visit (HOSPITAL_COMMUNITY): Payer: Medicare Other | Admitting: Anesthesiology

## 2016-05-09 ENCOUNTER — Ambulatory Visit (HOSPITAL_COMMUNITY)
Admission: RE | Admit: 2016-05-09 | Discharge: 2016-05-09 | Disposition: A | Discharge status: Home / Self Care | Payer: Medicare Other | Source: Ambulatory Visit | Attending: General Surgery | Admitting: General Surgery

## 2016-05-09 ENCOUNTER — Encounter (HOSPITAL_COMMUNITY)
Admission: RE | Disposition: A | Discharge status: Home / Self Care | Payer: Self-pay | Source: Ambulatory Visit | Attending: General Surgery

## 2016-05-09 ENCOUNTER — Ambulatory Visit (HOSPITAL_COMMUNITY): Payer: Medicare Other

## 2016-05-09 ENCOUNTER — Encounter (HOSPITAL_COMMUNITY): Payer: Self-pay | Admitting: Certified Registered Nurse Anesthetist

## 2016-05-09 DIAGNOSIS — K8010 Calculus of gallbladder with chronic cholecystitis without obstruction: Secondary | ICD-10-CM | POA: Insufficient documentation

## 2016-05-09 DIAGNOSIS — I10 Essential (primary) hypertension: Secondary | ICD-10-CM | POA: Diagnosis not present

## 2016-05-09 DIAGNOSIS — Z79899 Other long term (current) drug therapy: Secondary | ICD-10-CM | POA: Diagnosis not present

## 2016-05-09 DIAGNOSIS — K589 Irritable bowel syndrome without diarrhea: Secondary | ICD-10-CM | POA: Diagnosis not present

## 2016-05-09 DIAGNOSIS — E039 Hypothyroidism, unspecified: Secondary | ICD-10-CM | POA: Diagnosis not present

## 2016-05-09 DIAGNOSIS — K8020 Calculus of gallbladder without cholecystitis without obstruction: Secondary | ICD-10-CM

## 2016-05-09 DIAGNOSIS — K838 Other specified diseases of biliary tract: Secondary | ICD-10-CM | POA: Diagnosis not present

## 2016-05-09 HISTORY — PX: CHOLECYSTECTOMY: SHX55

## 2016-05-09 SURGERY — LAPAROSCOPIC CHOLECYSTECTOMY WITH INTRAOPERATIVE CHOLANGIOGRAM
Anesthesia: General | Site: Abdomen

## 2016-05-09 MED ORDER — ONDANSETRON HCL 4 MG/2ML IJ SOLN
INTRAMUSCULAR | Status: DC | PRN
  Administered 2016-05-09: 4 mg via INTRAVENOUS

## 2016-05-09 MED ORDER — ONDANSETRON HCL 4 MG/2ML IJ SOLN: 4.0000 mg | Freq: Once | INTRAMUSCULAR | Status: DC | PRN

## 2016-05-09 MED ORDER — ROCURONIUM BROMIDE 100 MG/10ML IV SOLN
INTRAVENOUS | Status: DC | PRN
  Administered 2016-05-09: 40 mg via INTRAVENOUS

## 2016-05-09 MED ORDER — 0.9 % SODIUM CHLORIDE (POUR BTL) OPTIME
TOPICAL | Status: DC | PRN
  Administered 2016-05-09: 1000 mL

## 2016-05-09 MED ORDER — FENTANYL CITRATE (PF) 100 MCG/2ML IJ SOLN
INTRAMUSCULAR | Status: DC | PRN
  Administered 2016-05-09: 100 ug via INTRAVENOUS
  Administered 2016-05-09 (×3): 50 ug via INTRAVENOUS

## 2016-05-09 MED ORDER — IOPAMIDOL (ISOVUE-300) INJECTION 61%
INTRAVENOUS | Status: AC
  Filled 2016-05-09: qty 50

## 2016-05-09 MED ORDER — LIDOCAINE HCL (CARDIAC) 20 MG/ML IV SOLN
INTRAVENOUS | Status: DC | PRN
  Administered 2016-05-09: 100 mg via INTRAVENOUS

## 2016-05-09 MED ORDER — ROCURONIUM BROMIDE 10 MG/ML (PF) SYRINGE
PREFILLED_SYRINGE | INTRAVENOUS | Status: AC
  Filled 2016-05-09: qty 10

## 2016-05-09 MED ORDER — SUGAMMADEX SODIUM 200 MG/2ML IV SOLN
INTRAVENOUS | Status: DC | PRN
  Administered 2016-05-09: 140 mg via INTRAVENOUS

## 2016-05-09 MED ORDER — EPHEDRINE SULFATE 50 MG/ML IJ SOLN
INTRAMUSCULAR | Status: DC | PRN
  Administered 2016-05-09: 5 mg via INTRAVENOUS
  Administered 2016-05-09: 10 mg via INTRAVENOUS

## 2016-05-09 MED ORDER — BUPIVACAINE-EPINEPHRINE (PF) 0.25% -1:200000 IJ SOLN
INTRAMUSCULAR | Status: AC
  Filled 2016-05-09: qty 30

## 2016-05-09 MED ORDER — LACTATED RINGERS IV SOLN
INTRAVENOUS | Status: DC | PRN
  Administered 2016-05-09: 07:00:00 via INTRAVENOUS

## 2016-05-09 MED ORDER — CIPROFLOXACIN IN D5W 400 MG/200ML IV SOLN
400.0000 mg | INTRAVENOUS | Status: AC
  Administered 2016-05-09: 400 mg via INTRAVENOUS

## 2016-05-09 MED ORDER — MEPERIDINE HCL 25 MG/ML IJ SOLN: 6.2500 mg | INTRAMUSCULAR | Status: DC | PRN

## 2016-05-09 MED ORDER — CHLORHEXIDINE GLUCONATE CLOTH 2 % EX PADS: 6.0000 | MEDICATED_PAD | Freq: Once | CUTANEOUS | Status: DC

## 2016-05-09 MED ORDER — SODIUM CHLORIDE 0.9 % IV SOLN
INTRAVENOUS | Status: DC | PRN
  Administered 2016-05-09: 8 mL

## 2016-05-09 MED ORDER — CIPROFLOXACIN IN D5W 400 MG/200ML IV SOLN
INTRAVENOUS | Status: AC
  Filled 2016-05-09: qty 200

## 2016-05-09 MED ORDER — SODIUM CHLORIDE 0.9 % IR SOLN
Status: DC | PRN
Start: 2016-05-09 — End: 2016-05-09
  Administered 2016-05-09: 1000 mL

## 2016-05-09 MED ORDER — FENTANYL CITRATE (PF) 250 MCG/5ML IJ SOLN
INTRAMUSCULAR | Status: AC
  Filled 2016-05-09: qty 5

## 2016-05-09 MED ORDER — BUPIVACAINE-EPINEPHRINE 0.25% -1:200000 IJ SOLN
INTRAMUSCULAR | Status: DC | PRN
  Administered 2016-05-09: 10 mL

## 2016-05-09 MED ORDER — PROPOFOL 10 MG/ML IV BOLUS
INTRAVENOUS | Status: AC
  Filled 2016-05-09: qty 20

## 2016-05-09 MED ORDER — HYDROCODONE-ACETAMINOPHEN 5-325 MG PO TABS: 1.0000 | ORAL_TABLET | ORAL | 0 refills | Status: DC | PRN

## 2016-05-09 MED ORDER — EPHEDRINE 5 MG/ML INJ
INTRAVENOUS | Status: AC
  Filled 2016-05-09: qty 10

## 2016-05-09 MED ORDER — SUGAMMADEX SODIUM 200 MG/2ML IV SOLN
INTRAVENOUS | Status: AC
  Filled 2016-05-09: qty 2

## 2016-05-09 MED ORDER — ONDANSETRON HCL 4 MG/2ML IJ SOLN
INTRAMUSCULAR | Status: AC
  Filled 2016-05-09: qty 2

## 2016-05-09 MED ORDER — FENTANYL CITRATE (PF) 100 MCG/2ML IJ SOLN: 25.0000 ug | INTRAMUSCULAR | Status: DC | PRN

## 2016-05-09 MED ORDER — LIDOCAINE 2% (20 MG/ML) 5 ML SYRINGE
INTRAMUSCULAR | Status: AC
  Filled 2016-05-09: qty 5

## 2016-05-09 MED ORDER — PROPOFOL 10 MG/ML IV BOLUS
INTRAVENOUS | Status: DC | PRN
  Administered 2016-05-09: 70 mg via INTRAVENOUS

## 2016-05-09 SURGICAL SUPPLY — 52 items
APL SKNCLS STERI-STRIP NONHPOA (GAUZE/BANDAGES/DRESSINGS) ×1
APPLIER CLIP 5 13 M/L LIGAMAX5 (MISCELLANEOUS) ×3
APR CLP MED LRG 5 ANG JAW (MISCELLANEOUS) ×1
BAG SPEC RTRVL 10 TROC 200 (ENDOMECHANICALS) ×1
BAG SPEC RTRVL LRG 6X4 10 (ENDOMECHANICALS) ×1
BENZOIN TINCTURE PRP APPL 2/3 (GAUZE/BANDAGES/DRESSINGS) ×3 IMPLANT
CANISTER SUCTION 2500CC (MISCELLANEOUS) ×3 IMPLANT
CATH REDDICK CHOLANGI 4FR 50CM (CATHETERS) ×3 IMPLANT
CHLORAPREP W/TINT 26ML (MISCELLANEOUS) ×3 IMPLANT
CLIP APPLIE 5 13 M/L LIGAMAX5 (MISCELLANEOUS) ×1 IMPLANT
CLOSURE STERI-STRIP 1/2X4 (GAUZE/BANDAGES/DRESSINGS) ×1
CLOSURE WOUND 1/2 X4 (GAUZE/BANDAGES/DRESSINGS) ×1
CLSR STERI-STRIP ANTIMIC 1/2X4 (GAUZE/BANDAGES/DRESSINGS) ×1 IMPLANT
COVER MAYO STAND STRL (DRAPES) ×3 IMPLANT
COVER SURGICAL LIGHT HANDLE (MISCELLANEOUS) ×5 IMPLANT
DECANTER SPIKE VIAL GLASS SM (MISCELLANEOUS) ×1 IMPLANT
DRAPE C-ARM 42X72 X-RAY (DRAPES) ×3 IMPLANT
DRSG TEGADERM 2-3/8X2-3/4 SM (GAUZE/BANDAGES/DRESSINGS) ×12 IMPLANT
ELECT REM PT RETURN 9FT ADLT (ELECTROSURGICAL) ×3
ELECTRODE REM PT RTRN 9FT ADLT (ELECTROSURGICAL) ×1 IMPLANT
GAUZE SPONGE 2X2 8PLY STRL LF (GAUZE/BANDAGES/DRESSINGS) ×1 IMPLANT
GLOVE BIOGEL PI IND STRL 7.5 (GLOVE) IMPLANT
GLOVE BIOGEL PI IND STRL 8 (GLOVE) ×1 IMPLANT
GLOVE BIOGEL PI INDICATOR 7.5 (GLOVE) ×2
GLOVE BIOGEL PI INDICATOR 8 (GLOVE) ×2
GLOVE ECLIPSE 7.5 STRL STRAW (GLOVE) ×4 IMPLANT
GLOVE ECLIPSE 8.0 STRL XLNG CF (GLOVE) ×3 IMPLANT
GLOVE SURG ORTHO 8.0 STRL STRW (GLOVE) ×2 IMPLANT
GOWN STRL REUS W/ TWL LRG LVL3 (GOWN DISPOSABLE) ×3 IMPLANT
GOWN STRL REUS W/TWL LRG LVL3 (GOWN DISPOSABLE) ×9
IV CATH 14GX2 1/4 (CATHETERS) ×5 IMPLANT
KIT BASIN OR (CUSTOM PROCEDURE TRAY) ×3 IMPLANT
KIT ROOM TURNOVER OR (KITS) ×3 IMPLANT
NS IRRIG 1000ML POUR BTL (IV SOLUTION) ×3 IMPLANT
PAD ARMBOARD 7.5X6 YLW CONV (MISCELLANEOUS) ×3 IMPLANT
POUCH RETRIEVAL ECOSAC 10 (ENDOMECHANICALS) IMPLANT
POUCH RETRIEVAL ECOSAC 10MM (ENDOMECHANICALS) ×2
POUCH SPECIMEN RETRIEVAL 10MM (ENDOMECHANICALS) ×3 IMPLANT
SCISSORS LAP 5X35 DISP (ENDOMECHANICALS) ×3 IMPLANT
SET IRRIG TUBING LAPAROSCOPIC (IRRIGATION / IRRIGATOR) ×3 IMPLANT
SLEEVE ENDOPATH XCEL 5M (ENDOMECHANICALS) ×6 IMPLANT
SPECIMEN JAR SMALL (MISCELLANEOUS) ×3 IMPLANT
SPONGE GAUZE 2X2 STER 10/PKG (GAUZE/BANDAGES/DRESSINGS) ×2
STRIP CLOSURE SKIN 1/2X4 (GAUZE/BANDAGES/DRESSINGS) ×2 IMPLANT
SUT MON AB 4-0 PC3 18 (SUTURE) ×3 IMPLANT
TOWEL OR 17X24 6PK STRL BLUE (TOWEL DISPOSABLE) ×3 IMPLANT
TOWEL OR 17X26 10 PK STRL BLUE (TOWEL DISPOSABLE) ×1 IMPLANT
TRAY LAPAROSCOPIC MC (CUSTOM PROCEDURE TRAY) ×3 IMPLANT
TROCAR XCEL BLUNT TIP 100MML (ENDOMECHANICALS) ×3 IMPLANT
TROCAR XCEL NON-BLD 11X100MML (ENDOMECHANICALS) IMPLANT
TROCAR XCEL NON-BLD 5MMX100MML (ENDOMECHANICALS) ×3 IMPLANT
TUBING INSUFFLATION (TUBING) ×5 IMPLANT

## 2016-05-09 NOTE — Op Note (Signed)
OPERATIVE NOTE-LAPAROSCOPIC CHOLECYSTECTOMY  Preoperative diagnosis:  Symptomatic cholelithiasis  Postoperative diagnosis:  Same  Procedure: Laparoscopic cholecystectomy with cholangiogram.  Surgeon: Avel Peace, M.D.  Asst.:  Darnell Level M.D.  Anesthesia: General  Indication:   [name] who had a significant case of biliary colic. She presented to the emergency department. Evaluation demonstrated gallstones An account bile duct diameter of 7.8 mm which is upper limits of normal for age.  Liver function tests were normal. She now presents for elective cholecystectomy.  Technique: She was brought to the operating room, placed supine on the operating table, and a general anesthetic was administered.  The abdominal wall was then sterilely prepped and draped.  A timeout was performed.    She had a long midline scar from her previous colectomy. Local anesthetic (Marcaine) was infiltrated in the RUQ. A small RUQ incision was made through the skin and subcutaneous tissue.  Using a 5 mm Optiview trocar and laparoscope, access was gained into the peritoneal cavity and a pneumoperitoneum was created.  The laparoscope was introduced into the trocar and no underlying bleeding or organ injury was noted. Adhesions were noted between the small intestine and omentum and abdominal wall deep to the scar. A 5 mm trocar was placed in the right lateral abdomen under vision. Using blunt and sharp dissection I was able to divide adhesions and free up the subumbilical space. A subumbilical incision was then made and a 12 mm trocar placed through this incision into the peritoneal cavity under direct vision. Next the angled laparoscope was placed through the 12 mm trocar. A 5 mm trocar was then placed through a small upper midline incision.   The gallbladder was visualized and the fundus was grasped and retracted toward the right shoulder.  The infundibulum was mobilized with dissection close to  the gallbladder and retracted laterally. The cystic duct was identified and a window was created around it. The cystic artery was also identified and a window was created around it. The critical view was achieved. A clip was placed at the neck of the gallbladder. A small incision was made in the cystic duct. A cholangiocatheter was introduced through the anterior abdominal wall and placed in the cystic duct. A intraoperative cholangiogram was then performed.  Under real-time fluoroscopy, dilute contrast was injected into the cystic duct.  The common hepatic duct, the right and left hepatic ducts, and the common duct were all visualized. Contrast drained into the duodenum without obvious evidence of any obstructing ductal lesion. The final report is pending the Radiologist's interpretation.  The cholangiocatheter was removed, the cystic duct was clipped 3 times on the biliary side, and then the cystic duct was divided sharply. No bile leak was noted from the cystic duct stump.  The cystic artery was then clipped and divided. Following this the gallbladder was dissected free from the liver using electrocautery. The gallbladder was then placed in a retrieval bag and removed from the abdominal cavity through the subumbilical incision.  The gallbladder fossa was inspected, irrigated, and bleeding was controlled with electrocautery. Inspection showed that hemostasis was adequate and there was no evidence of bile leak.  The irrigation fluid was evacuated as much as possible.  The subumbilical trocar was removed and the fascial defect was closed with interrupted 0 Vicryl sutures.  This was visualized laparoscopically. The fascial closure was solid.  The remaining trocars were removed and the pneumoperitoneum was released. The skin incisions were closed with 4-0 Monocryl subcuticular stitches. Steri-Strips  and sterile dressings were applied.  The procedure was well-tolerated without any apparent complications.  She was taken to the recovery room in satisfactory condition.

## 2016-05-09 NOTE — H&P (View-Only) (Signed)
Dominique Livingston 04/20/2016 1:27 PM Location: [facility] Patient #: [id number] DOB: 29-Mar-1933 Married / Language: English / Race: Asian Female  History of Present Illness Adolph Pollack(Jalyah Weinheimer J. Murel Shenberger MD; 04/20/2016 2:15 PM) The patient is a 82 year old female.   Note:She is referred by Dr. Fredderick PhenixBelfi, EDP, for consultation regarding symptomatic cholelithiasis. She is here with her daughter. She was in her normal state of health until a few nights ago. She had a milkshake in the afternoon than a tomato and cheese sandwich. Following that she had some significant right upper quadrant and right flank pain that escalated. It was associated with some nausea and vomiting. She presented to the emergency department and was noted to have, normal LFTs, normal WBC and lipase, and an ultrasound which showed the following:  IMPRESSION: Cholelithiasis with a large stone in the gallbladder. Mild pericholecystic edema. Murphy's sign is negative. Mass versus tumefactive sludge also demonstrated within the gallbladder. Suggest MRI/MRCP for follow-up. Fatty infiltration of the liver. Prominent pancreatic duct may indicate inflammatory change. No obstructing lesions identified.  She has not had any other episodes of pain since her time in the emergency room 4 days ago.  Allergies Fay Records(Ashley Beck, CMA; 04/20/2016 1:28 PM) Augmentin *PENICILLINS* Erythromycin *OPHTHALMIC AGENTS*  Medication History Fay Records(Ashley Beck, CMA; 04/20/2016 1:29 PM) Tylenol (500MG  Capsule, Oral as needed) Active. Multiple Vitamin (Oral) Active. Zofran (4MG  Tablet, Oral) Active. Levothyroxine Sodium (88MCG Capsule, Oral) Active. Lisinopril (10MG  Tablet, Oral) Active. Medications Reconciled     Review of Systems Fay Records(Ashley Beck CMA; 04/20/2016 1:27 PM) General Not Present- Appetite Loss, Chills, Fatigue, Fever, Night Sweats, Weight Gain and Weight Loss. Skin Not Present- Change in Wart/Mole, Dryness, Hives, Jaundice, New  Lesions, Non-Healing Wounds, Rash and Ulcer. HEENT Present- Seasonal Allergies. Not Present- Earache, Hearing Loss, Hoarseness, Nose Bleed, Oral Ulcers, Ringing in the Ears, Sinus Pain, Sore Throat, Visual Disturbances, Wears glasses/contact lenses and Yellow Eyes. Respiratory Not Present- Bloody sputum, Chronic Cough, Difficulty Breathing, Snoring and Wheezing. Breast Not Present- Breast Mass, Breast Pain, Nipple Discharge and Skin Changes. Gastrointestinal Present- Nausea. Not Present- Abdominal Pain, Bloating, Bloody Stool, Change in Bowel Habits, Chronic diarrhea, Constipation, Difficulty Swallowing, Excessive gas, Gets full quickly at meals, Hemorrhoids, Indigestion, Rectal Pain and Vomiting. Female Genitourinary Not Present- Frequency, Nocturia, Painful Urination, Pelvic Pain and Urgency. Musculoskeletal Not Present- Back Pain, Joint Pain, Joint Stiffness, Muscle Pain, Muscle Weakness and Swelling of Extremities. Neurological Not Present- Decreased Memory, Fainting, Headaches, Numbness, Seizures, Tingling, Tremor, Trouble walking and Weakness. Psychiatric Not Present- Anxiety, Bipolar, Change in Sleep Pattern, Depression, Fearful and Frequent crying. Endocrine Not Present- Cold Intolerance, Excessive Hunger, Hair Changes, Heat Intolerance, Hot flashes and New Diabetes. Hematology Not Present- Blood Thinners, Easy Bruising, Excessive bleeding, Gland problems, HIV and Persistent Infections.  Vitals Fay Records(Ashley Beck CMA; 04/20/2016 1:29 PM) 04/20/2016 1:29 PM Weight: 144.6 lb Height: 63in Body Surface Area: 1.68 m Body Mass Index: 25.61 kg/m  Temp.: 97.72F(Temporal)  Pulse: 88 (Regular)  BP: 124/82 (Sitting, Left Arm, Standard)      Physical Exam Adolph Pollack(Nyko Gell J. Jaiah Weigel MD; 04/20/2016 2:13 PM)  The physical exam findings are as follows: Note:General: WDWN in NAD. Pleasant and cooperative.  HEENT: [place]/AT, no external nasal or ear masses, mucous membranes are moist  EYES: EOMI, no  scleral icterus, pupils normal  NECK: Supple, no obvious mass or thyroid mass/enlargement, no trachea deviation  CV: RRR, no murmur, no edema  CHEST: Breath sounds equal and clear. Respirations nonlabored.  ABDOMEN: Soft, nontender, nondistended, no masses, no  organomegaly, long midline scar  SKIN: No jaundice or suspicious rashes.  NEUROLOGIC: Alert and oriented, answers questions appropriately.  PSYCHIATRIC: Normal mood, affect , and behavior.    Assessment & Plan (Adilen Pavelko J. Kayron Kalmar MD; 04/20/2016 2:14 PM)  SYMPTOMATIC CHOLELITHIASIS (K80.20) Impression: We discussed options for treatment including expected management versus cholecystectomy. Given the size of the gallstone as well as her symptomatic bout and the question of sludge versus something else and the gallbladder I recommend proceeding with laparoscopic possible open cholecystectomy and she agrees with the recommendation.  Plan: Laparoscopic possible open cholecystectomy with cholangiogram. Strict low-fat diet. I have explained the procedure, risks, and aftercare of cholecystectomy. Risks include but are not limited to bleeding, infection, wound problems, anesthesia, diarrhea, bile leak, injury to common bile duct/liver/intestine. She seems to understand and agrees to proceed.  Diego Ulbricht, MD 

## 2016-05-09 NOTE — Progress Notes (Signed)
Care of pt assumed by MA Rosi Secrist RN 

## 2016-05-09 NOTE — Discharge Instructions (Addendum)
LAPAROSCOPIC GALLBLADDER SURGERY: POST OP INSTRUCTIONS  1. DIET: Follow a light  diet the first 24 hours after arrival home, such as soup, liquids, crackers, etc.  Be sure to include lots of fluids daily.  Avoid fast food or heavy meals as your are more likely to get nauseated.  Eat a low fat beginning 2 days after your surgery.   2. Take your usually prescribed home medications unless otherwise directed. 3. PAIN CONTROL: a. Pain is best controlled by a usual combination of three different methods TOGETHER: i. Ice/Heat ii. Over the counter pain medication iii. Prescription pain medication b. Most patients will experience some swelling and bruising around the incisions.  Ice packs or heating pads (30-60 minutes up to 6 times a day) will help. Use ice for the first few days to help decrease swelling and bruising, then switch to heat to help relax tight/sore spots and speed recovery.  Some people prefer to use ice alone, heat alone, alternating between ice & heat.  Experiment to what works for you.  Swelling and bruising can take several weeks to resolve.   c. It is helpful to take an over-the-counter pain medication regularly for the first few weeks.  Choose one of the following that works best for you: i. Naproxen (Aleve, etc)  Two  tabs twice a day ii. Ibuprofen (Advil, etc) Three  tabs four times a day (every meal & bedtime) iii. Acetaminophen (Tylenol, etc) 500-650mg  four times a day (every meal & bedtime) d. A  prescription for pain medication (such as oxycodone, hydrocodone, etc) should be given to you upon discharge.  Take your pain medication as prescribed.  i. If you are having problems/concerns with the prescription medicine (does not control pain, nausea, vomiting, rash, itching, etc), please call us [phone] to see if we need to switch you to a different pain medicine that will work better for you and/or control your side effect better. ii. If you need a refill on your pain  medication, please contact your pharmacy.  They will contact our office to request authorization. Prescriptions will not be filled after 5 pm or on week-ends. 4. Avoid getting constipated.  Between the surgery and the pain medications, it is common to experience some constipation.  Increasing fluid intake and taking a fiber supplement (such as Metamucil, Citrucel, FiberCon, MiraLax, etc) 1-2 times a day regularly will usually help prevent this problem from occurring.  A mild laxative (prune juice, Milk of Magnesia, MiraLax, etc) should be taken according to package directions if there are no bowel movements after 48 hours.   5. Watch out for diarrhea.  If you have many loose bowel movements, simplify your diet to bland foods & liquids for a few days.  Stop any stool softeners and decrease your fiber supplement.  Switching to mild anti-diarrheal medications (Kayopectate, Pepto Bismol) can help.  If this worsens or does not improve, please call us. 6. Wash / shower every day.  You may shower over the dressings as they are waterproof.  Continue to shower over incision(s) after the dressing is off. 7. Remove your waterproof bandages 3 days after surgery.  You may leave the incision open to air.  You may replace a dressing/Band-Aid to cover the incision for comfort if you wish.  8. ACTIVITIES as tolerated:   a. You may resume regular (light) daily activities beginning the next day--such as daily self-care, walking, climbing stairs--gradually increasing light activities as tolerated.  No heavy lifting (over 10 pounds), straining,  or intense activities for 2 weeks. b. DO NOT PUSH THROUGH PAIN.  Let pain be your guide: If it hurts to do something, don't do it.  Pain is your body warning you to avoid that activity for another week until the pain goes down. c. You may drive when you are no longer taking prescription pain medication, you can comfortably wear a seatbelt, and you can safely maneuver your car and apply  brakes. d. Bonita QuinYou may have sexual intercourse when it is comfortable.  9. FOLLOW UP in our office a. Please call [facility] at [phone] to set up an appointment to see your surgeon in the office for a follow-up appointment approximately 2-3 weeks after your surgery. b. Make sure that you call for this appointment the day you arrive home to insure a convenient appointment time. 10. IF YOU HAVE DISABILITY OR FAMILY LEAVE FORMS, BRING THEM TO THE OFFICE FOR PROCESSING.  DO NOT GIVE THEM TO YOUR DOCTOR.  11.  Return to work/school:  Desk work/light activities in 5-7 days, full duty/activities in 2 weeks if pain-free.   WHEN TO CALL US [phone]: 1. Poor pain control 2. Reactions / problems with new medications (rash/itching, nausea, etc)  3. Fever over 101.5 F (38.5 C) 4. Inability to urinate 5. Nausea and/or vomiting 6. Worsening swelling or bruising 7. Continued bleeding from incision. 8. Increased pain, redness, or drainage from the incision   The clinic staff is available to answer your questions during regular business hours (8:30am-5pm).  Please dont hesitate to call and ask to speak to one of our nurses for clinical concerns.   If you have a medical emergency, go to the nearest emergency room or call 911.  A surgeon from [facility] is always on call at the [facility]   [facility], [place] [address], [address], [place], [place]  [address] ? MAIN: [phone] ? TOLL FREE: [phone] ?  FAX [phone] [email or web address]

## 2016-05-09 NOTE — Anesthesia Postprocedure Evaluation (Signed)
Anesthesia Post Note  Patient: Dominique Livingston  Procedure(s) Performed: Procedure(s) (LRB): LAPAROSCOPIC CHOLECYSTECTOMY WITH INTRAOPERATIVE CHOLANGIOGRAM (N/A)  Patient location during evaluation: PACU Anesthesia Type: General Level of consciousness: sedated Pain management: satisfactory to patient Vital Signs Assessment: post-procedure vital signs reviewed and stable Respiratory status: spontaneous breathing Cardiovascular status: stable Anesthetic complications: no    Last Vitals:  Vitals:   05/09/16 1015 05/09/16 1030  BP: (!) 113/56 (!) 136/54  Pulse: 72 69  Resp: 18 18  Temp:      Last Pain:  Vitals:   05/09/16 1015  TempSrc:   PainSc: 1                  [name] EDWARD

## 2016-05-09 NOTE — Anesthesia Procedure Notes (Signed)
Procedure Name: Intubation Date/Time: 05/09/2016 7:37 AM Performed by: Rise PatienceBELL, Onedia Vargus T Pre-anesthesia Checklist: Patient identified, Emergency Drugs available, Suction available and Patient being monitored Patient Re-evaluated:Patient Re-evaluated prior to inductionOxygen Delivery Method: Circle System Utilized Preoxygenation: Pre-oxygenation with 100% oxygen Intubation Type: IV induction Ventilation: Mask ventilation without difficulty Laryngoscope Size: Miller and 2 Grade View: Grade I Tube type: Oral Tube size: 7.5 mm Number of attempts: 1 Airway Equipment and Method: Stylet Placement Confirmation: ETT inserted through vocal cords under direct vision,  positive ETCO2 and breath sounds checked- equal and bilateral Secured at: 21 cm Tube secured with: Tape Dental Injury: Teeth and Oropharynx as per pre-operative assessment

## 2016-05-09 NOTE — Transfer of Care (Signed)
Immediate Anesthesia Transfer of Care Note  Patient: Dominique Livingston  Procedure(s) Performed: Procedure(s): LAPAROSCOPIC CHOLECYSTECTOMY WITH INTRAOPERATIVE CHOLANGIOGRAM (N/A)  Patient Location: PACU  Anesthesia Type:General  Level of Consciousness: awake, alert  and oriented  Airway & Oxygen Therapy: Patient Spontanous Breathing  Post-op Assessment: Report given to RN, Post -op Vital signs reviewed and stable and Patient moving all extremities X 4  Post vital signs: Reviewed and stable  Last Vitals:  Vitals:   05/09/16 0639 05/09/16 0909  BP: (!) 158/55   Pulse: 68   Resp: 18   Temp: 36.6 C 36.4 C    Last Pain:  Vitals:   05/09/16 0639  TempSrc: Oral      Patients Stated Pain Goal: 2 (05/09/16 0702)  Complications: No apparent anesthesia complications

## 2016-05-09 NOTE — Interval H&P Note (Signed)
History and Physical Interval Note:  05/09/2016 7:20 AM  Dominique Livingston  has presented today for surgery, with the diagnosis of Symptomatic cholelithiasis  The various methods of treatment have been discussed with the patient and family. After consideration of risks, benefits and other options for treatment, the patient has consented to  Procedure(s): LAPAROSCOPIC CHOLECYSTECTOMY WITH INTRAOPERATIVE CHOLANGIOGRAM (N/A) as a surgical intervention .  The patient's history has been reviewed, patient examined, no change in status, stable for surgery.  I have reviewed the patient's chart and labs.  Questions were answered to the patient's satisfaction.     Brennan Karam JShela Commons

## 2016-05-10 ENCOUNTER — Encounter (HOSPITAL_COMMUNITY): Payer: Self-pay | Admitting: General Surgery

## 2016-06-06 ENCOUNTER — Other Ambulatory Visit: Payer: Self-pay

## 2016-06-27 ENCOUNTER — Other Ambulatory Visit: Payer: Self-pay | Admitting: Internal Medicine

## 2016-06-30 ENCOUNTER — Other Ambulatory Visit: Payer: Self-pay | Admitting: Internal Medicine

## 2016-06-30 DIAGNOSIS — Z1231 Encounter for screening mammogram for malignant neoplasm of breast: Secondary | ICD-10-CM

## 2016-07-20 ENCOUNTER — Ambulatory Visit: Payer: Medicare Other

## 2016-08-22 ENCOUNTER — Other Ambulatory Visit: Payer: Self-pay | Admitting: Internal Medicine

## 2016-10-03 ENCOUNTER — Other Ambulatory Visit: Payer: Self-pay | Admitting: Internal Medicine

## 2016-10-03 NOTE — Telephone Encounter (Signed)
Refill x 90 days Book CPE after July 17

## 2016-11-02 ENCOUNTER — Ambulatory Visit (INDEPENDENT_AMBULATORY_CARE_PROVIDER_SITE_OTHER): Payer: Medicare Other | Admitting: Internal Medicine

## 2016-11-02 ENCOUNTER — Encounter: Payer: Self-pay | Admitting: Internal Medicine

## 2016-11-02 DIAGNOSIS — R634 Abnormal weight loss: Secondary | ICD-10-CM

## 2016-11-02 DIAGNOSIS — F411 Generalized anxiety disorder: Secondary | ICD-10-CM | POA: Diagnosis not present

## 2016-11-02 DIAGNOSIS — L659 Nonscarring hair loss, unspecified: Secondary | ICD-10-CM | POA: Diagnosis not present

## 2016-11-02 DIAGNOSIS — E039 Hypothyroidism, unspecified: Secondary | ICD-10-CM

## 2016-11-02 DIAGNOSIS — R5383 Other fatigue: Secondary | ICD-10-CM | POA: Diagnosis not present

## 2016-11-02 DIAGNOSIS — F439 Reaction to severe stress, unspecified: Secondary | ICD-10-CM

## 2016-11-02 LAB — IRON AND TIBC
%SAT: 23 % (ref 11–50)
Iron: 73 ug/dL (ref 45–160)
TIBC: 315 ug/dL (ref 250–450)
UIBC: 242 ug/dL (ref 125–400)

## 2016-11-02 LAB — HEMOGLOBIN A1C
HEMOGLOBIN A1C: 5.3 % (ref <5.7)
Mean Plasma Glucose: 105 mg/dL

## 2016-11-02 LAB — CBC WITH DIFFERENTIAL/PLATELET
BASOS ABS: 59 {cells}/uL (ref 0–200)
BASOS PCT: 1 %
EOS ABS: 118 {cells}/uL (ref 15–500)
Eosinophils Relative: 2 %
HEMATOCRIT: 37.5 % (ref 35.0–45.0)
Hemoglobin: 12.4 g/dL (ref 11.7–15.5)
LYMPHS PCT: 33 %
Lymphs Abs: 1947 cells/uL (ref 850–3900)
MCH: 29.9 pg (ref 27.0–33.0)
MCHC: 33.1 g/dL (ref 32.0–36.0)
MCV: 90.4 fL (ref 80.0–100.0)
MONO ABS: 295 {cells}/uL (ref 200–950)
MONOS PCT: 5 %
MPV: 8.6 fL (ref 7.5–12.5)
Neutro Abs: 3481 cells/uL (ref 1500–7800)
Neutrophils Relative %: 59 %
PLATELETS: 204 10*3/uL (ref 140–400)
RBC: 4.15 MIL/uL (ref 3.80–5.10)
RDW: 13.5 % (ref 11.0–15.0)
WBC: 5.9 10*3/uL (ref 3.8–10.8)

## 2016-11-02 LAB — FOLATE: Folate: >24 ng/mL (ref >5.4)

## 2016-11-02 LAB — VITAMIN B12: Vitamin B-12: 565 pg/mL (ref 200–1100)

## 2016-11-02 LAB — TSH: TSH: 0.13 mIU/L — ABNORMAL LOW

## 2016-11-02 NOTE — Progress Notes (Signed)
   Subjective:    Patient ID: Dominique GallusShirley L Wetherington, female    DOB: 1933/06/27, 83 y.o.   MRN: [id number]  HPI  83 year old Female seen in July 2017 for physical exam and had normal thyroid functions at that time with history of hypothyroidism on thyroid replacement therapy. 2 days after her physical exam in mid July she was in the emergency department diagnosed with gallstone. She underwent laparoscopic cholecystectomy by Dr. Abbey Chattersosenbower. August 8. After that her hair began to fall out. Her husband is sick frequently with recurrent urinary tract infections. At one point he was in the ICU with septicemia and they thought he might not live. She's been under a fair amount of stress with that. She lost 16 pounds since July. She feels well and feels that her appetite is good but she's concerned about her hair loss. She thought it might be due to anesthesia and thought it should be better by nail. She does take a multivitamin. Has only misted a few doses of thyroid medication.    Review of Systems see above     Objective:   Physical Exam  No thyromegaly. Her hair is thinning but still has plenty of hair and no significant areas of alopecia areata.      Assessment & Plan:  Hypothyroidism  Hair loss  Situational stress  Weight loss  Plan: We're going to check iron and iron-binding capacity B-12 and folate levels. We'll check TSH and CBC. Recommend biotin supplement. Recommend massage scalp nightly.

## 2016-11-02 NOTE — Patient Instructions (Signed)
Lab work drawn and pending. Take biotin supplement and multivitamin with iron daily. Follow-up in July. Monitor weight.

## 2016-11-03 ENCOUNTER — Other Ambulatory Visit: Payer: Self-pay

## 2016-11-03 MED ORDER — LEVOTHYROXINE SODIUM 50 MCG PO TABS: 50.0000 ug | ORAL_TABLET | Freq: Every day | ORAL | 0 refills | Status: DC

## 2016-11-22 ENCOUNTER — Other Ambulatory Visit: Payer: Self-pay | Admitting: Internal Medicine

## 2016-12-15 ENCOUNTER — Encounter: Payer: Self-pay | Admitting: Internal Medicine

## 2016-12-15 ENCOUNTER — Other Ambulatory Visit: Payer: Self-pay | Admitting: Internal Medicine

## 2016-12-15 ENCOUNTER — Ambulatory Visit (INDEPENDENT_AMBULATORY_CARE_PROVIDER_SITE_OTHER): Payer: Medicare Other | Admitting: Internal Medicine

## 2016-12-15 VITALS — BP 136/72 | HR 68 | Temp 98.3°F | Resp 18 | Wt 131.0 lb

## 2016-12-15 DIAGNOSIS — R946 Abnormal results of thyroid function studies: Secondary | ICD-10-CM

## 2016-12-15 DIAGNOSIS — E039 Hypothyroidism, unspecified: Secondary | ICD-10-CM | POA: Diagnosis not present

## 2016-12-15 DIAGNOSIS — R7989 Other specified abnormal findings of blood chemistry: Secondary | ICD-10-CM

## 2016-12-15 NOTE — Progress Notes (Signed)
   Subjective:    [name], female    DOB: [birth year], 83 y.o.   MRN: [id number]  HPI   83 year old Female for follow up of abnormal TSH.Thinks her hair is coming back. Feels a bit better. Questions about why her dosage was decreased rather than increased. Explained to her that TSH was low at 0.13 and previously had been normal 8 months ago at 1.41.    Review of Systems see above     Objective:   Physical Exam  No thyromegaly. Chest clear. Cardiac exam regular rate and rhythm.      Assessment & Plan:  Hypothyroidism-it looks like it last visit she was on too much thyroid replacement medication and dosage was decreased to 0.05 mg daily.  TSH is now elevated at 4.55. We will return to original dose at 0.075 mg daily. Take on empty stomach with no food or other medication and recheck in 6 weeks with office visit.

## 2016-12-16 LAB — TSH: TSH: 4.55 mIU/L — ABNORMAL HIGH

## 2016-12-19 ENCOUNTER — Telehealth: Payer: Self-pay | Admitting: Internal Medicine

## 2016-12-19 NOTE — Telephone Encounter (Signed)
Patient called back r/t the message left for her r/t her TSH levels.  Went over the lab results in detail.  Explained that she needs to take medication on empty stomach at least 1 hour before eating anything or taking any other medication.  Patient is not happy about this regime for this medication.  States she has for YEARS taken her thyroid medication at bedtime.  And, says she has been taking it with her Zyrtec at bedtime; so she doesn't understand why all of a sudden it has gotten out "whack".    Asked her to try taking it in the morning for this 6 weeks and then she could talk to Dr. Lenord FellersBaxley about the possibility about going back to taking it at night as she has always done.  She states she will have to take all of her other medications at dinner time (I.e. BP med, Vitamins, etc. ).  She will discuss this with Dr. Lenord FellersBaxley on her next visit in 6 weeks.    Patient verbalized understanding of these instructions and will do take the medication in the morning, one hour prior to eating anything and with NO OTHER medication.  Appointment given for return lab in 6 weeks; TSH on 4/30 and RTC on 5/3 @ 11:45 to see Dr. Lenord FellersBaxley in follow up.

## 2016-12-23 NOTE — Patient Instructions (Signed)
Return to original thyroid replacement dose 0.075 mg levothyroxine daily and return in 6 weeks. Take medication on empty stomach with no food or medication. Do not eat for 1 hour after taking medication.

## 2016-12-28 IMAGING — RF DG CHOLANGIOGRAM OPERATIVE
1 series · 6 of 6 positions shown · non-contrast
Comparison: Right upper quadrant abdominal ultrasound - 04/16/2016

CLINICAL DATA: Intraoperative cholangiogram during laparoscopic
cholecystectomy.

EXAM:
INTRAOPERATIVE CHOLANGIOGRAM
FLUOROSCOPY TIME:  15 seconds

[Series 1: run · 3 acquisitions, 6 frames shown]
[im 1/3]
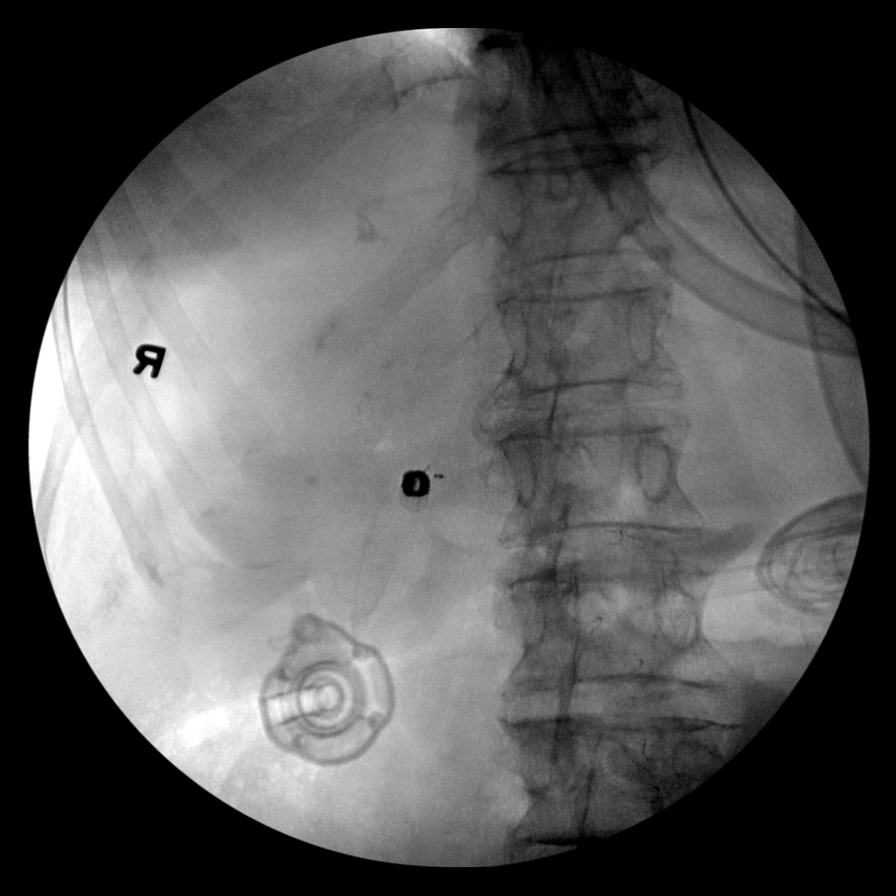
[im 1/3]
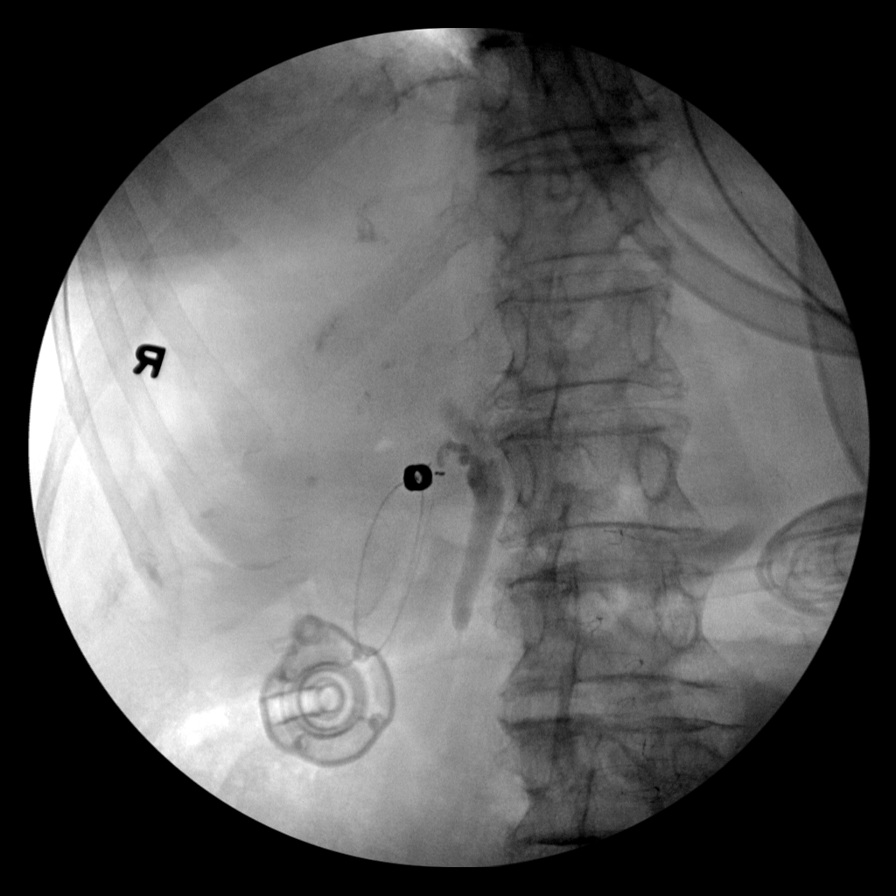
[im 1/3]
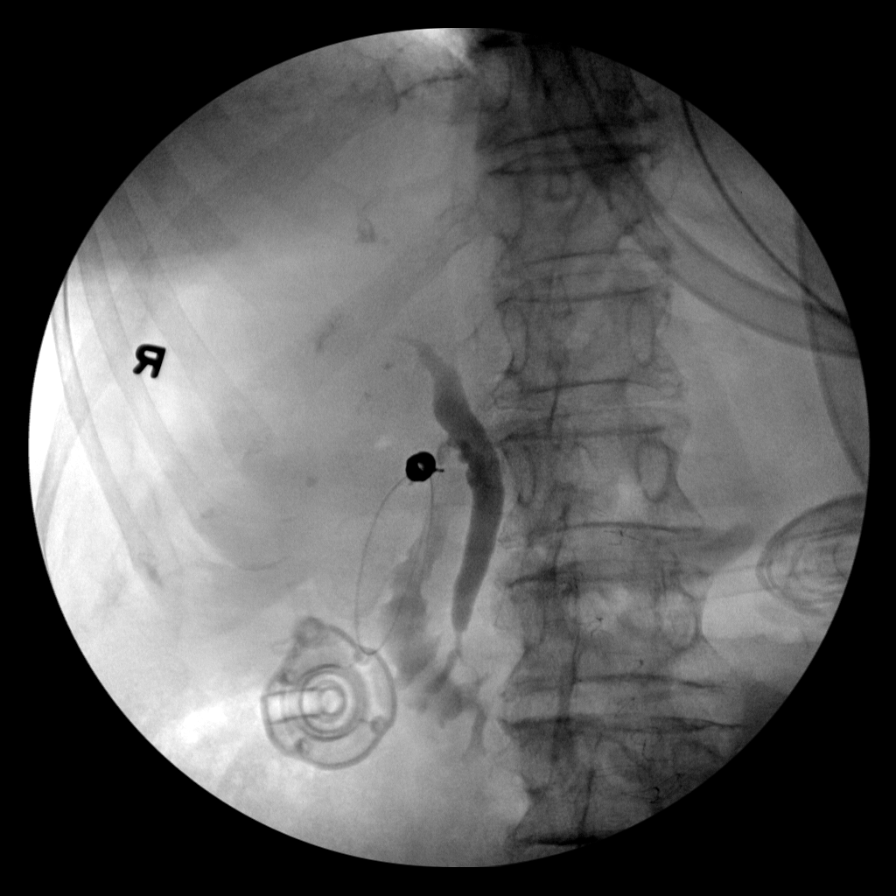
[im 1/3]
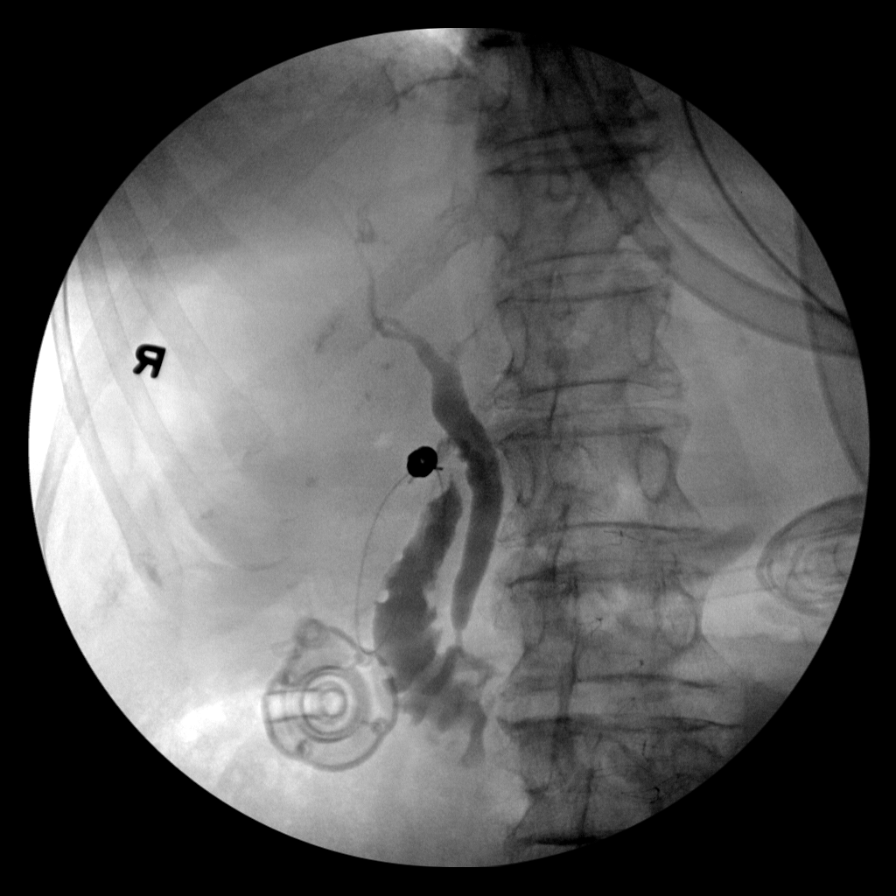
[im 2/3]
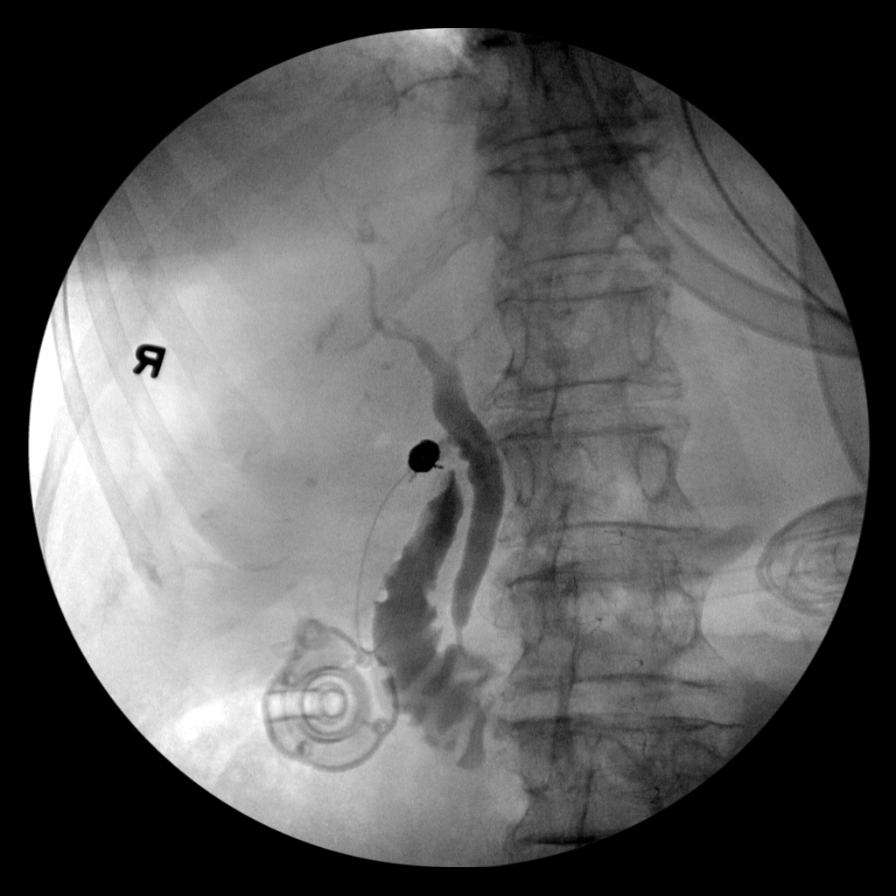
[im 3/3]
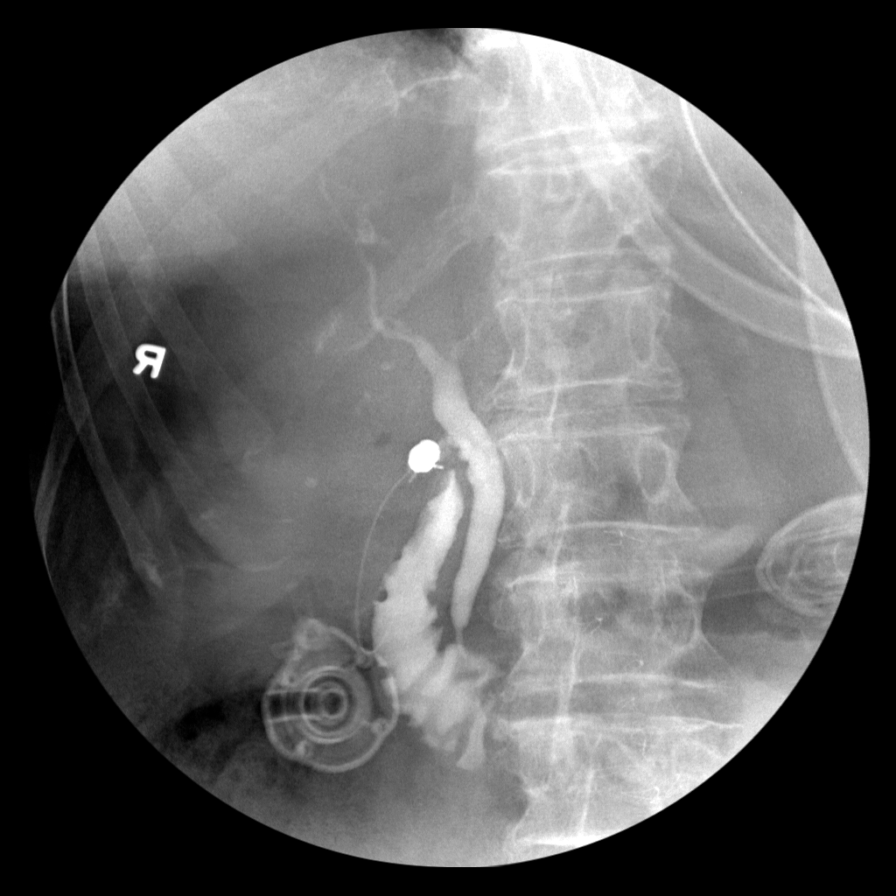

[6 of 6 positions shown; findings below may reference images not displayed]

FINDINGS: Intraoperative cholangiographic images of the right upper abdominal
quadrant during laparoscopic cholecystectomy are provided for
review.

Contrast injection demonstrates selective cannulation of the central
aspect of the cystic duct.

There is passage of contrast through the central aspect of the
cystic duct with filling of a non dilated common bile duct. There is
passage of contrast though the CBD and into the descending portion
of the duodenum.

There is minimal reflux of injected contrast into the common hepatic
duct and central aspect of the non dilated intrahepatic biliary
system.

There are no discrete filling defects within the opacified portions
of the biliary system to suggest the presence of
choledocholithiasis.
IMPRESSION: No evidence of choledocholithiasis.

## 2017-01-29 ENCOUNTER — Other Ambulatory Visit: Payer: Medicare Other | Admitting: Internal Medicine

## 2017-01-29 DIAGNOSIS — E039 Hypothyroidism, unspecified: Secondary | ICD-10-CM

## 2017-01-29 LAB — TSH: TSH: 2.43 m[IU]/L

## 2017-02-01 ENCOUNTER — Ambulatory Visit (INDEPENDENT_AMBULATORY_CARE_PROVIDER_SITE_OTHER): Payer: Medicare Other | Admitting: Internal Medicine

## 2017-02-01 ENCOUNTER — Encounter: Payer: Self-pay | Admitting: Internal Medicine

## 2017-02-01 VITALS — BP 128/60 | HR 72 | Temp 97.0°F | Wt 133.0 lb

## 2017-02-01 DIAGNOSIS — G4700 Insomnia, unspecified: Secondary | ICD-10-CM | POA: Diagnosis not present

## 2017-02-01 DIAGNOSIS — L659 Nonscarring hair loss, unspecified: Secondary | ICD-10-CM

## 2017-02-01 DIAGNOSIS — I10 Essential (primary) hypertension: Secondary | ICD-10-CM

## 2017-02-01 DIAGNOSIS — E039 Hypothyroidism, unspecified: Secondary | ICD-10-CM

## 2017-02-01 DIAGNOSIS — F411 Generalized anxiety disorder: Secondary | ICD-10-CM | POA: Diagnosis not present

## 2017-02-01 MED ORDER — ALPRAZOLAM 0.25 MG PO TABS: 0.2500 mg | ORAL_TABLET | Freq: Two times a day (BID) | ORAL | 0 refills | Status: DC | PRN

## 2017-02-01 MED ORDER — LEVOTHYROXINE SODIUM 75 MCG PO TABS: 75.0000 ug | ORAL_TABLET | Freq: Every day | ORAL | 3 refills | Status: DC

## 2017-02-01 NOTE — Progress Notes (Signed)
   Subjective:    [name], female    DOB: 1933/05/07, 83 y.o.   MRN: [id number]  HPI 83 year old Female here to follow-up on hypothyroidism. Current dose is 0.075 mg daily. Her hair feels better and she seems to feel a bit better. Her TSH is now normal. She is now taking thyroid replacement on an empty stomach.  Also has been having some issues with sleep. Husband has a lot of health problems and it keeps her anxious and worried. She been taking Benadryl generic 50 mg at bedtime but that doesn't seem to help.  She is wondering about caffeine consumption. If she gets a Coke at lunchtime she seems to not sleep well at night. She probably should try a non-caffeinated soda and avoid coffee at least mid to late afternoon.    Review of Systems     Objective:   Physical Exam  No thyromegaly. TSH reviewed and is within normal limits. She seems anxious.      Assessment & Plan:  Hypothyroidism-TSH is now normal  Situational stress with husband  Anxiety and insomnia try Xanax 0.25 mg at bedtime #30  She has appointment for physical examination in July and we'll follow-up on these issues at that time.

## 2017-02-01 NOTE — Patient Instructions (Signed)
Continue same dose of thyroid replacement. Follow-up at physical exam in July. Xanax 0.25 mg at bedtime for sleep and anxiety.

## 2017-02-21 ENCOUNTER — Other Ambulatory Visit: Payer: Self-pay | Admitting: Internal Medicine

## 2017-03-31 DIAGNOSIS — I10 Essential (primary) hypertension: Secondary | ICD-10-CM | POA: Diagnosis not present

## 2017-03-31 DIAGNOSIS — T63421A Toxic effect of venom of ants, accidental (unintentional), initial encounter: Secondary | ICD-10-CM | POA: Diagnosis not present

## 2017-03-31 DIAGNOSIS — Z6823 Body mass index (BMI) 23.0-23.9, adult: Secondary | ICD-10-CM | POA: Diagnosis not present

## 2017-04-16 ENCOUNTER — Ambulatory Visit (INDEPENDENT_AMBULATORY_CARE_PROVIDER_SITE_OTHER): Payer: Medicare Other | Admitting: Internal Medicine

## 2017-04-16 ENCOUNTER — Encounter: Payer: Self-pay | Admitting: Internal Medicine

## 2017-04-16 VITALS — BP 118/60 | HR 70 | Temp 98.0°F | Ht 62.5 in | Wt 135.0 lb

## 2017-04-16 DIAGNOSIS — Z0000 Encounter for general adult medical examination without abnormal findings: Secondary | ICD-10-CM

## 2017-04-16 DIAGNOSIS — Z872 Personal history of diseases of the skin and subcutaneous tissue: Secondary | ICD-10-CM | POA: Diagnosis not present

## 2017-04-16 DIAGNOSIS — Z8739 Personal history of other diseases of the musculoskeletal system and connective tissue: Secondary | ICD-10-CM

## 2017-04-16 DIAGNOSIS — Z789 Other specified health status: Secondary | ICD-10-CM

## 2017-04-16 DIAGNOSIS — M19042 Primary osteoarthritis, left hand: Secondary | ICD-10-CM

## 2017-04-16 DIAGNOSIS — Z91018 Allergy to other foods: Secondary | ICD-10-CM

## 2017-04-16 DIAGNOSIS — F411 Generalized anxiety disorder: Secondary | ICD-10-CM | POA: Diagnosis not present

## 2017-04-16 DIAGNOSIS — I10 Essential (primary) hypertension: Secondary | ICD-10-CM | POA: Diagnosis not present

## 2017-04-16 DIAGNOSIS — I6523 Occlusion and stenosis of bilateral carotid arteries: Secondary | ICD-10-CM | POA: Diagnosis not present

## 2017-04-16 DIAGNOSIS — M8580 Other specified disorders of bone density and structure, unspecified site: Secondary | ICD-10-CM

## 2017-04-16 DIAGNOSIS — F439 Reaction to severe stress, unspecified: Secondary | ICD-10-CM

## 2017-04-16 DIAGNOSIS — J301 Allergic rhinitis due to pollen: Secondary | ICD-10-CM

## 2017-04-16 DIAGNOSIS — E785 Hyperlipidemia, unspecified: Secondary | ICD-10-CM

## 2017-04-16 DIAGNOSIS — E039 Hypothyroidism, unspecified: Secondary | ICD-10-CM | POA: Diagnosis not present

## 2017-04-16 DIAGNOSIS — M19041 Primary osteoarthritis, right hand: Secondary | ICD-10-CM

## 2017-04-16 LAB — CBC WITH DIFFERENTIAL/PLATELET
BASOS ABS: 66 {cells}/uL (ref 0–200)
Basophils Relative: 1 %
Eosinophils Absolute: 198 cells/uL (ref 15–500)
Eosinophils Relative: 3 %
HEMATOCRIT: 39.8 % (ref 35.0–45.0)
HEMOGLOBIN: 13 g/dL (ref 11.7–15.5)
Lymphocytes Relative: 29 %
Lymphs Abs: 1914 cells/uL (ref 850–3900)
MCH: 30.6 pg (ref 27.0–33.0)
MCHC: 32.7 g/dL (ref 32.0–36.0)
MCV: 93.6 fL (ref 80.0–100.0)
MONO ABS: 396 {cells}/uL (ref 200–950)
MPV: 8.3 fL (ref 7.5–12.5)
Monocytes Relative: 6 %
NEUTROS ABS: 4026 {cells}/uL (ref 1500–7800)
NEUTROS PCT: 61 %
Platelets: 200 10*3/uL (ref 140–400)
RBC: 4.25 MIL/uL (ref 3.80–5.10)
RDW: 13.3 % (ref 11.0–15.0)
WBC: 6.6 10*3/uL (ref 3.8–10.8)

## 2017-04-16 LAB — COMPLETE METABOLIC PANEL WITH GFR
ALBUMIN: 4.6 g/dL (ref 3.6–5.1)
ALK PHOS: 61 U/L (ref 33–130)
ALT: 15 U/L (ref 6–29)
AST: 18 U/L (ref 10–35)
BILIRUBIN TOTAL: 0.6 mg/dL (ref 0.2–1.2)
BUN: 18 mg/dL (ref 7–25)
CALCIUM: 9.6 mg/dL (ref 8.6–10.4)
CO2: 29 mmol/L (ref 20–31)
Chloride: 102 mmol/L (ref 98–110)
Creat: 0.86 mg/dL (ref 0.60–0.88)
GFR, Est African American: 73 mL/min (ref >=60)
GFR, Est Non African American: 63 mL/min (ref >=60)
GLUCOSE: 88 mg/dL (ref 65–99)
POTASSIUM: 4.2 mmol/L (ref 3.5–5.3)
SODIUM: 141 mmol/L (ref 135–146)
TOTAL PROTEIN: 7.6 g/dL (ref 6.1–8.1)

## 2017-04-16 LAB — LIPID PANEL
CHOLESTEROL: 297 mg/dL — AB (ref <200)
HDL: 69 mg/dL (ref >50)
LDL Cholesterol: 191 mg/dL — ABNORMAL HIGH (ref <100)
Total CHOL/HDL Ratio: 4.3 Ratio (ref <5.0)
Triglycerides: 184 mg/dL — ABNORMAL HIGH (ref <150)
VLDL: 37 mg/dL — ABNORMAL HIGH (ref <30)

## 2017-04-16 LAB — POCT URINALYSIS DIPSTICK
Bilirubin, UA: NEGATIVE
Blood, UA: NEGATIVE
Glucose, UA: NEGATIVE
Ketones, UA: NEGATIVE
LEUKOCYTES UA: NEGATIVE
NITRITE UA: NEGATIVE
PH UA: 6.5 (ref 5.0–8.0)
PROTEIN UA: NEGATIVE
Spec Grav, UA: 1.015 (ref 1.010–1.025)
UROBILINOGEN UA: 0.2 U/dL

## 2017-04-16 LAB — TSH: TSH: 1.55 m[IU]/L

## 2017-04-16 NOTE — Progress Notes (Signed)
Subjective:    Patient ID: Dominique GallusShirley L Lantigua, female    DOB: [birth year], 83 y.o.   MRN: [id number]  HPI   [age] year old Female  for [employer] [facility] and, health maintenance. evaluation of medical problems.   History of hyperlipidemia, is statin intolerant and cannot afford Zetia. History of polymyalgia rheumatica diagnosed in 2001 and has resolved. History of osteoarthritis in hands, allergic rhinitis, osteopenia, GE reflux, hypertension. History of fibrocystic breast disease. History of anxiety and has situational stress with husband's health. Has had some issues with insomnia related to anxiety.  Intolerant of Fosamax-it causes an adverse reaction. Augmentin causes nausea. Intolerant of statin medications.  Colonoscopy 2011. Immunizations are up-to-date. Zostavax vaccine 2011.  Past medical history: 2 surgeries for endometriosis in the remote past. One surgery for endometriosis involving left salpingo-oophorectomy. During that time there was obstruction of the sigmoid colon which resulted in a left colectomy by Dr. Rolene CourseFarley in 1969.  Patient subsequent had hysterectomy and right salpingo-oophorectomy perform a Dr. Lockie ParesFrances Berry. She had an ovarian cyst drained in late 1960s. Had excisional biopsy of the right breast showing fibrocystic breast disease in 1979.  Cataract extraction the right eye February 2006 in the left eye April 2006.  UTI May 2014.  Social history: She is married. Husband is retired Company secretaryfireman. She does not smoke or consume alcohol. She is a Futures traderhomemaker. 2 adult children, a son and a daughter. Daughter is a patient here.  She's had allergy testing positive for trees, grasses, chocolate, green peas, watermelon and cantaloupe.  Family history: Father died at age 81 with pneumonia/COPD heart problems. Mother died at age 81 of cancer that was metastatic from an unknown primary. 2 brothers with history of hypertension. Daughter with hypertension and allergies.    Review of Systems   Constitutional: Positive for fatigue.  Respiratory: Negative.   Cardiovascular: Negative.   Genitourinary: Negative.   Neurological: Negative.   Psychiatric/Behavioral:       Anxiety which is chronic and long-standing       Objective:   Physical Exam  Constitutional: She is oriented to person, place, and time. She appears well-developed and well-nourished. No distress.  HENT:  Head: Normocephalic and atraumatic.  Right Ear: External ear normal.  Left Ear: External ear normal.  Mouth/Throat: Oropharynx is clear and moist.  Eyes: Conjunctivae and EOM are normal. Left eye exhibits no discharge. No scleral icterus.  Neck: Neck supple. No JVD present. No thyromegaly present.  Cardiovascular: Normal rate, normal heart sounds and intact distal pulses.   No murmur heard. Pulmonary/Chest:  Breast without masses  Abdominal: Soft. Bowel sounds are normal. She exhibits no distension and no mass. There is no tenderness. There is no rebound and no guarding.  Genitourinary:  Genitourinary Comments: Deferred due to age, hysterectomy and BSO  Musculoskeletal: She exhibits no edema.  Bilateral Heberden and Bouchard's nodes  Lymphadenopathy:    She has no cervical adenopathy.  Neurological: She is alert and oriented to person, place, and time. She has normal reflexes. No cranial nerve deficit. Coordination normal.  Skin: Skin is warm and dry. No rash noted. She is not diaphoretic.  Psychiatric: She has a normal mood and affect. Her behavior is normal. Judgment and thought content normal.  Vitals reviewed.         Assessment & Plan:  Osteoarthritis of hands  Allergic rhinitis  Anxiety treated with Xanax  Hypothyroidism-stable on thyroid replacement therapy  Hyperlipidemia-statin intolerant and cannot afford Zetia  GE reflux  Osteopenia-intolerant  of Fosamax  Essential hypertension-stable on lisinopril  History of polymyalgia rheumatica-resolved  Plan: Continue same  medications and return in one year or as needed.  Subjective:   Patient presents for Medicare Annual/Subsequent preventive examination.  Review Past Medical/Family/Social:See above   Risk Factors  Current exercise habits: Sedentary Dietary issues discussed: Low fat low carbohydrate  Cardiac risk factors:Hyperlipidemia  Depression Screen  (Note: if answer to either of the following is "Yes", a more complete depression screening is indicated)   Over the past two weeks, have you felt down, depressed or hopeless? No  Over the past two weeks, have you felt little interest or pleasure in doing things? No Have you lost interest or pleasure in daily life? No Do you often feel hopeless? No Do you cry easily over simple problems? No   Activities of Daily Living  In your present state of health, do you have any difficulty performing the following activities?:   Driving? No  Managing money? No  Feeding yourself? No  Getting from bed to chair? No  Climbing a flight of stairs? No  Preparing food and eating?: No  Bathing or showering? No  Getting dressed: No  Getting to the toilet? No  Using the toilet:No  Moving around from place to place: No  In the past year have you fallen or had a near fall?:No  Are you sexually active? No  Do you have more than one partner? No   Hearing Difficulties: No  Do you often ask people to speak up or repeat themselves? No  Do you experience ringing or noises in your ears? No  Do you have difficulty understanding soft or whispered voices? No  Do you feel that you have a problem with memory? No Do you often misplace items? No    Home Safety:  Do you have a smoke alarm at your residence? Yes Do you have grab bars in the bathroom? Yes Do you have throw rugs in your house? No   Cognitive Testing  Alert? Yes Normal Appearance?Yes  Oriented to person? Yes Place? Yes  Time? Yes  Recall of three objects? Yes  Can perform simple calculations? Yes    Displays appropriate judgment?Yes  Can read the correct time from a watch face?Yes   List the Names of Other Physician/Practitioners you currently use:  See referral list for the physicians patient is currently seeing. Allergist    Review of Systems: See above Objective:     General appearance: Appears stated age  Head: Normocephalic, without obvious abnormality, atraumatic  Eyes: conj clear, EOMi PEERLA  Ears: normal TM's and external ear canals both ears  Nose: Nares normal. Septum midline. Mucosa normal. No drainage or sinus tenderness.  Throat: lips, mucosa, and tongue normal; teeth and gums normal  Neck: no adenopathy, no carotid bruit, no JVD, supple, symmetrical, trachea midline and thyroid not enlarged, symmetric, no tenderness/mass/nodules  No CVA tenderness.  Lungs: clear to auscultation bilaterally  Breasts: normal appearance, no masses or tenderness  Heart: regular rate and rhythm, S1, S2 normal, no murmur, click, rub or gallop  Abdomen: soft, non-tender; bowel sounds normal; no masses, no organomegaly  Musculoskeletal: ROM normal in all joints, no crepitus, no deformity, Normal muscle strengthen. Back  is symmetric, no curvature. Skin: Skin color, texture, turgor normal. No rashes or lesions  Lymph nodes: Cervical, supraclavicular, and axillary nodes normal.  Neurologic: CN 2 -12 Normal, Normal symmetric reflexes. Normal coordination and gait  Psych: Alert & Oriented x 3, Mood appear  stable.    Assessment:    Annual wellness medicare exam   Plan:    During the course of the visit the patient was educated and counseled about appropriate screening and preventive services including:   Given 3 Hemoccult cards to return     Patient Instructions (the written plan) was given to the patient.  Medicare Attestation  I have personally reviewed:  The patient's medical and social history  Their use of alcohol, tobacco or illicit drugs  Their current medications and  supplements  The patient's functional ability including ADLs,fall risks, home safety risks, cognitive, and hearing and visual impairment  Diet and physical activities  Evidence for depression or mood disorders  The patient's weight, height, BMI, and visual acuity have been recorded in the chart. I have made referrals, counseling, and provided education to the patient based on review of the above and I have provided the patient with a written personalized care plan for preventive services.

## 2017-04-23 ENCOUNTER — Encounter: Payer: Self-pay | Admitting: Internal Medicine

## 2017-04-23 ENCOUNTER — Ambulatory Visit (INDEPENDENT_AMBULATORY_CARE_PROVIDER_SITE_OTHER): Payer: Medicare Other | Admitting: Internal Medicine

## 2017-04-23 ENCOUNTER — Telehealth: Payer: Self-pay | Admitting: Internal Medicine

## 2017-04-23 VITALS — BP 138/76 | HR 67 | Temp 99.0°F | Wt 134.0 lb

## 2017-04-23 DIAGNOSIS — Z91018 Allergy to other foods: Secondary | ICD-10-CM | POA: Diagnosis not present

## 2017-04-23 DIAGNOSIS — L509 Urticaria, unspecified: Secondary | ICD-10-CM

## 2017-04-23 DIAGNOSIS — J309 Allergic rhinitis, unspecified: Secondary | ICD-10-CM

## 2017-04-23 DIAGNOSIS — F411 Generalized anxiety disorder: Secondary | ICD-10-CM

## 2017-04-23 MED ORDER — PREDNISONE 10 MG PO TABS: ORAL_TABLET | ORAL | 0 refills | Status: DC

## 2017-04-23 MED ORDER — METHYLPREDNISOLONE ACETATE 80 MG/ML IJ SUSP
80.0000 mg | Freq: Once | INTRAMUSCULAR | Status: AC
  Administered 2017-04-23: 80 mg via INTRAMUSCULAR

## 2017-04-23 NOTE — Telephone Encounter (Signed)
Needs OV.  

## 2017-04-23 NOTE — Telephone Encounter (Signed)
She woke up this morning to a bad case of hives.  She is itching pretty bad.  She ran out of Zyrtec for about 3 days.  She has now been back on the Zyrtec for 2 days.  She's not sure if that is what brought this on, or if she got into something or if this is a food allergy or what.  Says that she has been plagued with hives for years.    The hives are all over.  Arms, legs, stomach.  None on her face yet.    Advised I wasn't sure if you would need to see her for this.  Do you want her to come in to see you?  We have a 4:15 this afternoon.    Best contact for patient:  [phone]

## 2017-04-23 NOTE — Progress Notes (Signed)
   Subjective:    Patient ID: Dominique GallusShirley L Livingston, female    DOB: [birth year], 83 y.o.   MRN: [id number]  HPI  History of food allergies and allergic rhinitis. More rhinorrhea recently. Was out of Zyrtec for 3 days. Eating more cantaloupe and strawberries recently. Cannot tell me exactly which foods she is allergic to.  Bitten by fire ants supposedly  x 4 over a week ago.  Review of Systems see above- no respiratory distress.     Objective:   Physical Exam  Erythema inside upper arms. Urticarial lesions over abdomen and inner thighs.      Assessment & Plan:   Urticaria Hives Anxiety-currently not taking Xanax History of allergic rhinitis  Depomedrol 80 mg IM today  Prednisone 10 mg going from 60 mg to 0 mg over 7 days  Restart Zyrtec. Watch amounts of strawberry and cantaloupe Roderic ScarceEaton

## 2017-04-23 NOTE — Telephone Encounter (Signed)
Gave patient appointment for 4:15 this afternoon.  Patient confirmed.

## 2017-04-23 NOTE — Patient Instructions (Signed)
Depo-Medrol 80 mg IM. Take prednisone in tapering course as directed and restart Zyrtec.

## 2017-04-27 ENCOUNTER — Other Ambulatory Visit (INDEPENDENT_AMBULATORY_CARE_PROVIDER_SITE_OTHER): Payer: Medicare Other | Admitting: Internal Medicine

## 2017-04-27 DIAGNOSIS — Z0000 Encounter for general adult medical examination without abnormal findings: Secondary | ICD-10-CM

## 2017-04-27 LAB — HEMOCCULT GUIAC POC 1CARD (OFFICE)
Card #2 Fecal Occult Blod, POC: NEGATIVE
Card #3 Fecal Occult Blood, POC: NEGATIVE
Fecal Occult Blood, POC: POSITIVE — AB

## 2017-04-27 NOTE — Patient Instructions (Signed)
Hemoccult Cards received in the mail today.

## 2017-04-30 NOTE — Patient Instructions (Addendum)
Continue same medications and return in one year or as needed. Given 3 Hemoccult cards.

## 2017-05-10 ENCOUNTER — Other Ambulatory Visit (INDEPENDENT_AMBULATORY_CARE_PROVIDER_SITE_OTHER): Payer: Medicare Other | Admitting: Internal Medicine

## 2017-05-10 DIAGNOSIS — Z0000 Encounter for general adult medical examination without abnormal findings: Secondary | ICD-10-CM | POA: Diagnosis not present

## 2017-05-10 LAB — HEMOCCULT GUIAC POC 1CARD (OFFICE)
Card #2 Fecal Occult Blod, POC: NEGATIVE
FECAL OCCULT BLD: NEGATIVE
Fecal Occult Blood, POC: NEGATIVE

## 2017-05-10 NOTE — Patient Instructions (Signed)
Stool cards negative

## 2017-05-10 NOTE — Progress Notes (Signed)
Stool cards received and processed. All was negative. Patient is aware

## 2017-08-02 ENCOUNTER — Other Ambulatory Visit: Payer: Self-pay | Admitting: Internal Medicine

## 2017-08-02 DIAGNOSIS — Z1231 Encounter for screening mammogram for malignant neoplasm of breast: Secondary | ICD-10-CM

## 2017-08-29 ENCOUNTER — Ambulatory Visit
Admission: RE | Admit: 2017-08-29 | Discharge: 2017-08-29 | Disposition: A | Discharge status: Home / Self Care | Payer: Medicare Other | Source: Ambulatory Visit | Attending: Internal Medicine | Admitting: Internal Medicine

## 2017-08-29 DIAGNOSIS — Z1231 Encounter for screening mammogram for malignant neoplasm of breast: Secondary | ICD-10-CM | POA: Diagnosis not present

## 2017-10-09 ENCOUNTER — Other Ambulatory Visit: Payer: Self-pay | Admitting: Internal Medicine

## 2017-10-09 DIAGNOSIS — E039 Hypothyroidism, unspecified: Secondary | ICD-10-CM

## 2017-10-16 ENCOUNTER — Ambulatory Visit (INDEPENDENT_AMBULATORY_CARE_PROVIDER_SITE_OTHER): Payer: Medicare Other | Admitting: Internal Medicine

## 2017-10-16 ENCOUNTER — Encounter: Payer: Self-pay | Admitting: Internal Medicine

## 2017-10-16 VITALS — BP 130/70 | HR 77 | Temp 98.0°F | Ht 62.5 in | Wt 138.3 lb

## 2017-10-16 DIAGNOSIS — E039 Hypothyroidism, unspecified: Secondary | ICD-10-CM

## 2017-10-16 DIAGNOSIS — I10 Essential (primary) hypertension: Secondary | ICD-10-CM | POA: Diagnosis not present

## 2017-10-16 DIAGNOSIS — E785 Hyperlipidemia, unspecified: Secondary | ICD-10-CM

## 2017-10-16 DIAGNOSIS — F439 Reaction to severe stress, unspecified: Secondary | ICD-10-CM | POA: Diagnosis not present

## 2017-10-16 DIAGNOSIS — Z789 Other specified health status: Secondary | ICD-10-CM

## 2017-10-16 LAB — TSH: TSH: 2.53 m[IU]/L (ref 0.40–4.50)

## 2017-10-16 NOTE — Patient Instructions (Signed)
It was a pleasure to see you today.  Continue same medications.  TSH drawn and pending.  Return in 6 months.

## 2017-10-16 NOTE — Progress Notes (Signed)
   Subjective:    [name], female    DOB: [birth year], 84 y.o.   MRN: [id number]  HPI For 6 month recheck on HTN and hypothyroidism. TSH to be drawn today. Husband continues with many medical issues and frequent hospitalizations. Feels well today. No respiratory infections yet this season. Declines flu vaccine.  Hx hyperlipidemia and is statin intolerant and could not afford Zetia.  Blood pressure stable today at 130/80.    Review of Systems see above-no new complaints     Objective:   Physical Exam  Skin warm and dry.  Nodes none.  No thyromegaly.  No carotid bruits.  Chest clear to auscultation.  Cardiac exam regular rate and rhythm no slightly prominent S2.  No murmurs appreciated.  Extremities without edema.  Affect is normal.      Assessment & Plan:  Hypothyroidism-TSH drawn and pending  Hyperlipidemia-statin intolerant and cannot afford Zetia  Essential hypertension-stable on current regimen  Situational stress with husband's illness  Plan: Continue same medications and return in 6 months for physical examination.  She declines flu vaccine.

## 2017-11-19 ENCOUNTER — Ambulatory Visit (INDEPENDENT_AMBULATORY_CARE_PROVIDER_SITE_OTHER): Payer: Medicare Other | Admitting: Internal Medicine

## 2017-11-19 ENCOUNTER — Encounter: Payer: Self-pay | Admitting: Internal Medicine

## 2017-11-19 VITALS — BP 110/60 | HR 87 | Temp 101.3°F | Ht 62.5 in | Wt 139.0 lb

## 2017-11-19 DIAGNOSIS — J101 Influenza due to other identified influenza virus with other respiratory manifestations: Secondary | ICD-10-CM | POA: Diagnosis not present

## 2017-11-19 DIAGNOSIS — J029 Acute pharyngitis, unspecified: Secondary | ICD-10-CM

## 2017-11-19 DIAGNOSIS — R509 Fever, unspecified: Secondary | ICD-10-CM

## 2017-11-19 DIAGNOSIS — R52 Pain, unspecified: Secondary | ICD-10-CM | POA: Diagnosis not present

## 2017-11-19 LAB — POCT INFLUENZA A/B: INFLUENZA A, POC: POSITIVE — AB

## 2017-11-19 LAB — POCT RAPID STREP A (OFFICE): RAPID STREP A SCREEN: NEGATIVE

## 2017-11-19 MED ORDER — OSELTAMIVIR PHOSPHATE 75 MG PO CAPS: 75.0000 mg | ORAL_CAPSULE | Freq: Two times a day (BID) | ORAL | 0 refills | Status: DC

## 2017-11-19 NOTE — Progress Notes (Signed)
   Subjective:    Patient ID: Dominique GallusShirley L Ehlert, female    DOB: May 02, 1933, 84 y.o.   MRN: [id number]  HPI 84 year old Female with fever and cough.  Husband has been diagnosed with influenza and is hospitalized.    Review of Systems see above     Objective:   Physical Exam Pharynx is red.  Influenza A test is positive.  Rapid strep screen is negative.  TMs are clear.  Neck is supple.  Chest clear to auscultation.  She is alert and oriented.       Assessment & Plan:  Influenza A  Plan: Tamiflu 75 mg twice daily for 5 days.  Rest and drink plenty of fluids.  Tylenol for fever.

## 2017-11-19 NOTE — Patient Instructions (Signed)
Tamiflu 75 mg twice daily for 5 days.  Rest and drink plenty of fluids.  Tylenol as needed for fever.

## 2018-01-15 ENCOUNTER — Encounter: Payer: Self-pay | Admitting: Internal Medicine

## 2018-01-15 ENCOUNTER — Telehealth: Payer: Self-pay | Admitting: Internal Medicine

## 2018-01-15 NOTE — Telephone Encounter (Signed)
Onset Friday last week od diarrhea. Has had gas and indigestion since. No diarrhea yesterday or today. Husband insisted she call today. Says she has no abdominal tenderness at this time. Suggest clear liquids for 2-3 days and advance diet slowly over several more days. No fever or chills. No urinary symptoms. Call if symptoms worsen.

## 2018-01-17 ENCOUNTER — Other Ambulatory Visit: Payer: Self-pay

## 2018-01-17 ENCOUNTER — Emergency Department (HOSPITAL_COMMUNITY)
Admission: EM | Admit: 2018-01-17 | Discharge: 2018-01-17 | Disposition: A | Discharge status: Home / Self Care | Payer: Medicare Other | Attending: Emergency Medicine | Admitting: Emergency Medicine

## 2018-01-17 ENCOUNTER — Encounter (HOSPITAL_COMMUNITY): Payer: Self-pay

## 2018-01-17 ENCOUNTER — Telehealth: Payer: Self-pay

## 2018-01-17 ENCOUNTER — Emergency Department (HOSPITAL_COMMUNITY): Payer: Medicare Other

## 2018-01-17 DIAGNOSIS — R197 Diarrhea, unspecified: Secondary | ICD-10-CM | POA: Diagnosis not present

## 2018-01-17 DIAGNOSIS — R1030 Lower abdominal pain, unspecified: Secondary | ICD-10-CM | POA: Diagnosis not present

## 2018-01-17 DIAGNOSIS — I10 Essential (primary) hypertension: Secondary | ICD-10-CM | POA: Diagnosis not present

## 2018-01-17 DIAGNOSIS — K529 Noninfective gastroenteritis and colitis, unspecified: Secondary | ICD-10-CM | POA: Insufficient documentation

## 2018-01-17 DIAGNOSIS — Z79899 Other long term (current) drug therapy: Secondary | ICD-10-CM | POA: Diagnosis not present

## 2018-01-17 DIAGNOSIS — E039 Hypothyroidism, unspecified: Secondary | ICD-10-CM | POA: Diagnosis not present

## 2018-01-17 DIAGNOSIS — R531 Weakness: Secondary | ICD-10-CM | POA: Diagnosis not present

## 2018-01-17 LAB — URINALYSIS, ROUTINE W REFLEX MICROSCOPIC
Bacteria, UA: NONE SEEN
Bilirubin Urine: NEGATIVE
Glucose, UA: NEGATIVE mg/dL
HGB URINE DIPSTICK: NEGATIVE
KETONES UR: 5 mg/dL — AB
Nitrite: NEGATIVE
PROTEIN: NEGATIVE mg/dL
Specific Gravity, Urine: 1.009 (ref 1.005–1.030)
pH: 5 (ref 5.0–8.0)

## 2018-01-17 LAB — CBC
HCT: 39.4 % (ref 36.0–46.0)
HEMOGLOBIN: 12.7 g/dL (ref 12.0–15.0)
MCH: 30.2 pg (ref 26.0–34.0)
MCHC: 32.2 g/dL (ref 30.0–36.0)
MCV: 93.8 fL (ref 78.0–100.0)
Platelets: 199 10*3/uL (ref 150–400)
RBC: 4.2 MIL/uL (ref 3.87–5.11)
RDW: 13.5 % (ref 11.5–15.5)
WBC: 6 10*3/uL (ref 4.0–10.5)

## 2018-01-17 LAB — COMPREHENSIVE METABOLIC PANEL
ALT: 19 U/L (ref 14–54)
ANION GAP: 9 (ref 5–15)
AST: 26 U/L (ref 15–41)
Albumin: 4.6 g/dL (ref 3.5–5.0)
Alkaline Phosphatase: 61 U/L (ref 38–126)
BUN: 9 mg/dL (ref 6–20)
CHLORIDE: 104 mmol/L (ref 101–111)
CO2: 27 mmol/L (ref 22–32)
Calcium: 9.5 mg/dL (ref 8.9–10.3)
Creatinine, Ser: 0.89 mg/dL (ref 0.44–1.00)
GFR calc non Af Amer: 59 mL/min — ABNORMAL LOW (ref >60)
Glucose, Bld: 99 mg/dL (ref 65–99)
POTASSIUM: 4.2 mmol/L (ref 3.5–5.1)
Sodium: 140 mmol/L (ref 135–145)
Total Bilirubin: 0.8 mg/dL (ref 0.3–1.2)
Total Protein: 8 g/dL (ref 6.5–8.1)

## 2018-01-17 LAB — LIPASE, BLOOD: LIPASE: 24 U/L (ref 11–51)

## 2018-01-17 LAB — C DIFFICILE QUICK SCREEN W PCR REFLEX
C DIFFICILE (CDIFF) INTERP: NOT DETECTED
C DIFFICLE (CDIFF) ANTIGEN: NEGATIVE
C Diff toxin: NEGATIVE

## 2018-01-17 MED ORDER — CIPROFLOXACIN HCL 500 MG PO TABS: 500.0000 mg | ORAL_TABLET | Freq: Two times a day (BID) | ORAL | 0 refills | Status: DC

## 2018-01-17 MED ORDER — METRONIDAZOLE 500 MG PO TABS: 500.0000 mg | ORAL_TABLET | Freq: Three times a day (TID) | ORAL | 0 refills | Status: DC

## 2018-01-17 MED ORDER — DICYCLOMINE HCL 20 MG PO TABS: 20.0000 mg | ORAL_TABLET | Freq: Two times a day (BID) | ORAL | 0 refills | Status: DC | PRN

## 2018-01-17 MED ORDER — IOPAMIDOL (ISOVUE-300) INJECTION 61%
INTRAVENOUS | Status: AC
  Administered 2018-01-17: 100 mL
  Filled 2018-01-17: qty 100

## 2018-01-17 MED ORDER — CIPROFLOXACIN HCL 500 MG PO TABS
500.0000 mg | ORAL_TABLET | Freq: Once | ORAL | Status: AC
  Administered 2018-01-17: 500 mg via ORAL
  Filled 2018-01-17: qty 1

## 2018-01-17 MED ORDER — ONDANSETRON HCL 4 MG/2ML IJ SOLN
4.0000 mg | Freq: Once | INTRAMUSCULAR | Status: AC
  Administered 2018-01-17: 4 mg via INTRAVENOUS
  Filled 2018-01-17: qty 2

## 2018-01-17 MED ORDER — SODIUM CHLORIDE 0.9 % IV BOLUS
1000.0000 mL | Freq: Once | INTRAVENOUS | Status: AC
Start: 2018-01-17 — End: 2018-01-17
  Administered 2018-01-17: 1000 mL via INTRAVENOUS

## 2018-01-17 MED ORDER — FENTANYL CITRATE (PF) 100 MCG/2ML IJ SOLN
25.0000 ug | Freq: Once | INTRAMUSCULAR | Status: AC
  Administered 2018-01-17: 25 ug via INTRAVENOUS
  Filled 2018-01-17: qty 2

## 2018-01-17 MED ORDER — METRONIDAZOLE 500 MG PO TABS
500.0000 mg | ORAL_TABLET | Freq: Once | ORAL | Status: AC
  Administered 2018-01-17: 500 mg via ORAL
  Filled 2018-01-17: qty 1

## 2018-01-17 NOTE — ED Provider Notes (Addendum)
[facility] EMERGENCY DEPARTMENT Provider Note   CSN: [id number] Arrival date & time: 01/17/18  1028     History   Chief Complaint Chief Complaint  Patient presents with  . Abdominal Pain    HPI Dominique Livingston is a 84 y.o. female.  HPI   84 year old female with extensive past medical history as below including multiple intra-abdominal surgeries as well as IBS, here with lower abdominal pain.  The patient states that over the last week, she had acute onset and progressively worsening, persistent, aching, throbbing, lower abdominal pain.  The pain is worse with palpation and movement.  No alleviating factors.  She states she has had associated loose stools and diarrhea.  She has had nausea but no vomiting.  No fevers.  No weight loss.  No dysuria or frequency.  No vaginal bleeding or discharge.  Past Medical History:  Diagnosis Date  . Allergy   . Anxiety   . Arthritis    hands , stiffness in knees & hips   . Fibrocystic breast disease   . GERD (gastroesophageal reflux disease)   . Hyperlipidemia   . Hypertension   . IBS (irritable bowel syndrome)   . Polymyalgia rheumatica (HCC)   . Thyroid disease    hypothyroidism    Patient Active Problem List   Diagnosis Date Noted  . Allergic rhinitis 03/16/2014  . History of polymyalgia rheumatica 03/16/2014  . HTN (hypertension) 03/08/2012  . Hyperlipidemia 03/08/2012  . Hypothyroidism 03/08/2012    Past Surgical History:  Procedure Laterality Date  . ABDOMINAL HYSTERECTOMY    . BREAST SURGERY     biopsy rt breast  . CHOLECYSTECTOMY N/A 05/09/2016   Procedure: LAPAROSCOPIC CHOLECYSTECTOMY WITH INTRAOPERATIVE CHOLANGIOGRAM;  Surgeon: Avel Peace, MD;  Location: [facility] OR;  Service: General;  Laterality: N/A;  . EYE SURGERY Bilateral    cataracts  . LEFT COLECTOMY     due to endometriosos      OB History   None      Home Medications    Prior to Admission medications   Medication Sig Start  Date End Date Taking? Authorizing Provider  acetaminophen (TYLENOL) 500 MG tablet Take 1,000 mg by mouth every 6 (six) hours as needed.   Yes [provider]  ALPRAZolam (XANAX) 0.25 MG tablet Take 1 tablet (0.25 mg total) by mouth 2 (two) times daily as needed for anxiety. 02/01/17  Yes Baxley, Luanna Cole, MD  Cholecalciferol (VITAMIN D PO) Take 1 tablet by mouth daily.   Yes [provider]  HYDROcodone-acetaminophen (NORCO/VICODIN) 5-325 MG tablet Take 1-2 tablets by mouth every 4 (four) hours as needed for moderate pain or severe pain. 05/09/16  Yes Rosenbower, Tawanna Cooler, MD  levothyroxine (SYNTHROID, LEVOTHROID) 75 MCG tablet Take 1 tablet (75 mcg total) by mouth daily. 02/01/17  Yes Baxley, Luanna Cole, MD  lisinopril (PRINIVIL,ZESTRIL) 5 MG tablet TAKE 1 TABLET BY MOUTH ONCE DAILY Patient taking differently: TAKE 5mg  BY MOUTH ONCE DAILY 02/21/17  Yes Baxley, Luanna Cole, MD  Multiple Vitamin (MULTIVITAMIN) tablet Take 1 tablet by mouth daily.   Yes [provider]  Polyethyl Glycol-Propyl Glycol (SYSTANE ULTRA OP) Place 1 drop into both eyes 2 (two) times daily.   Yes [provider]  ciprofloxacin (CIPRO) 500 MG tablet Take 1 tablet (500 mg total) by mouth every 12 (twelve) hours for 7 days. 01/17/18 01/24/18  Shaune Pollack, MD  dicyclomine (BENTYL) 20 MG tablet Take 1 tablet (20 mg total) by mouth 2 (two)  times daily as needed for spasms. Abdominal pain 01/17/18   Shaune PollackIsaacs, Lenia Housley, MD  metroNIDAZOLE (FLAGYL) 500 MG tablet Take 1 tablet (500 mg total) by mouth 3 (three) times daily for 7 days. 01/17/18 01/24/18  Shaune PollackIsaacs, Saman Giddens, MD  oseltamivir (TAMIFLU) 75 MG capsule Take 1 capsule (75 mg total) by mouth 2 (two) times daily. 11/19/17   Margaree MackintoshBaxley, Mary J, MD  predniSONE (DELTASONE) 10 MG tablet Take in tapering course as directed. 04/23/17   Margaree MackintoshBaxley, Mary J, MD    Family History Family History  Problem Relation Age of Onset  . Cancer Mother   . Heart disease Father   . COPD Father   .  Hypertension Daughter   . Breast cancer Neg Hx     Social History Social History   Tobacco Use  . Smoking status: Never Smoker  . Smokeless tobacco: Never Used  Substance Use Topics  . Alcohol use: No  . Drug use: No     Allergies   Amoxicillin-pot clavulanate; Erythromycin; and Mushroom extract complex   Review of Systems Review of Systems  Constitutional: Positive for fatigue.  Gastrointestinal: Positive for abdominal pain, diarrhea and nausea.  Neurological: Positive for weakness.  All other systems reviewed and are negative.    Physical Exam Updated Vital Signs BP (!) 145/59   Pulse 77   Temp 97.7 F (36.5 C) (Oral)   Resp 17   SpO2 98%   Physical Exam  Constitutional: She is oriented to person, place, and time. She appears well-developed and well-nourished. No distress.  HENT:  Head: Normocephalic and atraumatic.  Eyes: Conjunctivae are normal.  Neck: Neck supple.  Cardiovascular: Normal rate, regular rhythm and normal heart sounds. Exam reveals no friction rub.  No murmur heard. Pulmonary/Chest: Effort normal and breath sounds normal. No respiratory distress. She has no wheezes. She has no rales.  Abdominal: She exhibits no distension. There is tenderness (mild) in the right lower quadrant, suprapubic area and left lower quadrant. There is no rigidity and no guarding.  Musculoskeletal: She exhibits no edema.  Neurological: She is alert and oriented to person, place, and time. She exhibits normal muscle tone.  Skin: Skin is warm. Capillary refill takes less than 2 seconds.  Psychiatric: She has a normal mood and affect.  Nursing note and vitals reviewed.    ED Treatments / Results  Labs (all labs ordered are listed, but only abnormal results are displayed) Labs Reviewed  COMPREHENSIVE METABOLIC PANEL - Abnormal; Notable for the following components:      Result Value   GFR calc non Af Amer 59 (*)    All other components within normal limits    URINALYSIS, ROUTINE W REFLEX MICROSCOPIC - Abnormal; Notable for the following components:   Ketones, ur 5 (*)    Leukocytes, UA TRACE (*)    Squamous Epithelial / LPF 0-5 (*)    All other components within normal limits  C DIFFICILE QUICK SCREEN W PCR REFLEX  GASTROINTESTINAL PANEL BY PCR, STOOL (REPLACES STOOL CULTURE)  LIPASE, BLOOD  CBC    EKG EKG Interpretation  Date/Time:  Thursday January 17 2018 16:19:10 EDT Ventricular Rate:  76 PR Interval:    QRS Duration: 77 QT Interval:  413 QTC Calculation: 465 R Axis:   80 Text Interpretation:  Sinus rhythm RSR' in V1 or V2, probably normal variant No significant change since last tracing Confirmed by Shaune PollackIsaacs, Ameera Tigue [phone]([id number]) on 01/17/2018 5:37:14 PM   Radiology Ct Abdomen Pelvis W Contrast  Result Date:  01/17/2018 CLINICAL DATA:  Lower abdominal pain for 1 week, diarrhea and nausea. History of colon resection, hysterectomy. EXAM: CT ABDOMEN AND PELVIS WITH CONTRAST TECHNIQUE: Multidetector CT imaging of the abdomen and pelvis was performed using the standard protocol following bolus administration of intravenous contrast. CONTRAST:  ISOVUE-300 IOPAMIDOL (ISOVUE-300) INJECTION 61% COMPARISON:  None. FINDINGS: Lower chest: No acute abnormality. Hepatobiliary: No focal liver abnormality is seen. Status post cholecystectomy. No biliary dilatation. Pancreas: Unremarkable. No pancreatic ductal dilatation or surrounding inflammatory changes. Spleen: Normal in size without focal abnormality. Adrenals/Urinary Tract: Adrenal glands appear normal. Bilateral renal cysts. No renal stone or hydronephrosis bilaterally. No ureteral or bladder calculi identified. Bladder is unremarkable. Stomach/Bowel: No dilated large or small bowel loops. Fluid throughout the majority of the nondistended small and large bowel raising the possibility of underlying enteritis. No bowel wall thickening or evidence of bowel wall inflammation seen. Appendix is not seen.  Vascular/Lymphatic: Aortic atherosclerosis. No enlarged abdominal or pelvic lymph nodes. Reproductive: Status post hysterectomy. No adnexal masses. Other: No free fluid or abscess collection. No free intraperitoneal air. Subtle haziness within the lower abdominal mesentery, suggesting mild mesenteric edema. Musculoskeletal: Degenerative changes throughout the slightly scoliotic thoracolumbar spine, mild to moderate in degree. No acute or suspicious osseous finding. IMPRESSION: 1. Fluid throughout the nondistended large and small bowel. This can be an indication of underlying gastroenteritis and/or enterocolitis, and there is subtle hazy edema within the lower abdominal mesentery which supports the diagnosis of an adjacent enterocolitis. 2. No bowel obstruction. No free fluid, abscess collection or free intraperitoneal air. No evidence of acute solid organ abnormality. 3. Aortic atherosclerosis. 4. Additional chronic/incidental findings detailed above. Electronically Signed   By: Bary Richard M.D.   On: 01/17/2018 15:40    Procedures Procedures (including critical care time)  Medications Ordered in ED Medications  iopamidol (ISOVUE-300) 61 % injection (100 mLs  Contrast Given 01/17/18 1511)  fentaNYL (SUBLIMAZE) injection 25 mcg (25 mcg Intravenous Given 01/17/18 1602)  sodium chloride 0.9 % bolus 1,000 mL (0 mLs Intravenous Stopped 01/17/18 1723)  ondansetron (ZOFRAN) injection 4 mg (4 mg Intravenous Given 01/17/18 1601)  ciprofloxacin (CIPRO) tablet 500 mg (500 mg Oral Given 01/17/18 1620)  metroNIDAZOLE (FLAGYL) tablet 500 mg (500 mg Oral Given 01/17/18 1620)     Initial Impression / Assessment and Plan / ED Course  I have reviewed the triage vital signs and the nursing notes.  Pertinent labs & imaging results that were available during my care of the patient were reviewed by me and considered in my medical decision making (see chart for details).  Clinical Course as of Jan 18 1736  Thu Jan 18, 6067  465 84 year old female here with lower abdominal pain.  Differential includes diverticulitis, colitis, atypical obstruction.  Must also follow-up with a.  Patient has history of multiple intra-abdominal surgeries.  Will send for CT.  No vaginal bleeding, discharge, or symptoms to suggest GU etiology and she has no urinary symptoms.   [CI]  1532 UA c/w dehydration. IVF given. CT pending.   [CI]  1533 WBC normal, CMP at baseline, which is reassuring.   [CI]  1556 CT scan shows enterocolitis.  Vital signs remained stable.  Patient tolerated p.o.  Will obtain a stool sample.  I long discussion with patient and her daughter.  Given her otherwise well appearance, feels reasonable to start her empirically on antibiotics, with good return precautions and PCP follow-up.   [CI]    Clinical Course User Index [CI]  Shaune Pollack, MD    Had a long discussion with pt. her lab work and imaging is negative as above.  She has an enterocolitis.  Will start empirically on antibiotics.  She is to receive fluids prior to discharge, is tolerating p.o., and is well-appearing. No recent ABX use or signs of invasive C. Diff colitis.  Final Clinical Impressions(s) / ED Diagnoses   Final diagnoses:  Enterocolitis    ED Discharge Orders        Ordered    ciprofloxacin (CIPRO) 500 MG tablet  Every 12 hours     01/17/18 1554    metroNIDAZOLE (FLAGYL) 500 MG tablet  3 times daily     01/17/18 1554    dicyclomine (BENTYL) 20 MG tablet  2 times daily PRN     01/17/18 1554       Shaune Pollack, MD 01/17/18 1557    Shaune Pollack, MD 01/17/18 1737

## 2018-01-17 NOTE — ED Notes (Signed)
Patient transported to CT 

## 2018-01-17 NOTE — ED Notes (Signed)
Pt placed on bedpan

## 2018-01-17 NOTE — ED Triage Notes (Signed)
PT reports lower abdominal pain across entire lower abdomen x 1 week. Pt endorses diarrhea and nausea. PT states she tried liquid diet with no relief. Denies urinary symptoms.

## 2018-01-17 NOTE — ED Notes (Signed)
Pt stable, ambulatory, states understanding of discharge instructions 

## 2018-01-17 NOTE — Discharge Instructions (Addendum)
We will follow-up your stool studies and call you if anything needs to be changed  Drink at least 8 glasses of water daily for the next week

## 2018-01-17 NOTE — ED Provider Notes (Signed)
ED ECG REPORT   Date: 01/17/2018  Rate: 75  Rhythm: normal sinus rhythm  QRS Axis: normal  Intervals: normal  ST/T Wave abnormalities: nonspecific T wave changes  Conduction Disutrbances:Rightward IVCD  Narrative Interpretation:   Old EKG Reviewed: unchanged Unchanged from 04/16/2016 I have personally reviewed the EKG tracing and agree with the computerized printout as noted.  I did not have interaction with this patient.  I performed EKG interpretation only   Doug SouJacubowitz, Zaida Reiland, MD 01/17/18 1627

## 2018-01-17 NOTE — Telephone Encounter (Signed)
Patient called stated she has been following a bland diet, but she is having lower abd pain along with diarrhea and nausea, patient denied fever, chills or vomiting. Spoke with Dr. Lenord FellersBaxley and recommend for patient to go to the emergency department. Pt verbalized understanding.

## 2018-01-18 LAB — GASTROINTESTINAL PANEL BY PCR, STOOL (REPLACES STOOL CULTURE)
Adenovirus F40/41: NOT DETECTED
Astrovirus: NOT DETECTED
CAMPYLOBACTER SPECIES: NOT DETECTED
Cryptosporidium: NOT DETECTED
Cyclospora cayetanensis: NOT DETECTED
ENTEROAGGREGATIVE E COLI (EAEC): NOT DETECTED
Entamoeba histolytica: NOT DETECTED
Enteropathogenic E coli (EPEC): NOT DETECTED
Enterotoxigenic E coli (ETEC): NOT DETECTED
Giardia lamblia: NOT DETECTED
NOROVIRUS GI/GII: NOT DETECTED
PLESIMONAS SHIGELLOIDES: NOT DETECTED
Rotavirus A: NOT DETECTED
SALMONELLA SPECIES: NOT DETECTED
SHIGA LIKE TOXIN PRODUCING E COLI (STEC): NOT DETECTED
SHIGELLA/ENTEROINVASIVE E COLI (EIEC): NOT DETECTED
Sapovirus (I, II, IV, and V): NOT DETECTED
Vibrio cholerae: NOT DETECTED
Vibrio species: NOT DETECTED
Yersinia enterocolitica: NOT DETECTED

## 2018-01-22 ENCOUNTER — Telehealth: Payer: Self-pay | Admitting: Internal Medicine

## 2018-01-22 NOTE — Telephone Encounter (Signed)
Dominique PilotShirley Livingston Dominique Livingston 5.19.1935   Dominique ForestShirley called to say she does not feel like the medicine the hospital gave her on Thursday is working, she feels she should be much better by now. She would also like to know if stool culture is back. The hospital diagnosed her with Enterocolitis.

## 2018-01-22 NOTE — Telephone Encounter (Signed)
She needs follow up visit tomorrow.

## 2018-01-23 ENCOUNTER — Encounter: Payer: Self-pay | Admitting: Internal Medicine

## 2018-01-23 ENCOUNTER — Ambulatory Visit (INDEPENDENT_AMBULATORY_CARE_PROVIDER_SITE_OTHER): Payer: Medicare Other | Admitting: Internal Medicine

## 2018-01-23 ENCOUNTER — Encounter: Payer: Self-pay | Admitting: Gastroenterology

## 2018-01-23 VITALS — BP 120/70 | HR 70 | Temp 97.9°F | Ht 63.0 in | Wt 136.0 lb

## 2018-01-23 DIAGNOSIS — R110 Nausea: Secondary | ICD-10-CM

## 2018-01-23 DIAGNOSIS — K529 Noninfective gastroenteritis and colitis, unspecified: Secondary | ICD-10-CM

## 2018-01-23 DIAGNOSIS — R1030 Lower abdominal pain, unspecified: Secondary | ICD-10-CM | POA: Diagnosis not present

## 2018-01-23 DIAGNOSIS — R8290 Unspecified abnormal findings in urine: Secondary | ICD-10-CM | POA: Diagnosis not present

## 2018-01-23 LAB — POCT URINALYSIS DIPSTICK
Bilirubin, UA: NEGATIVE
Glucose, UA: NEGATIVE
Ketones, UA: NEGATIVE
NITRITE UA: NEGATIVE
PROTEIN UA: NEGATIVE
RBC UA: NEGATIVE
SPEC GRAV UA: 1.015 (ref 1.010–1.025)
UROBILINOGEN UA: 0.2 U/dL
pH, UA: 6 (ref 5.0–8.0)

## 2018-01-23 LAB — COMPLETE METABOLIC PANEL WITH GFR
AG Ratio: 1.7 (calc) (ref 1.0–2.5)
ALKALINE PHOSPHATASE (APISO): 51 U/L (ref 33–130)
ALT: 50 U/L — AB (ref 6–29)
AST: 65 U/L — ABNORMAL HIGH (ref 10–35)
Albumin: 4.7 g/dL (ref 3.6–5.1)
BILIRUBIN TOTAL: 0.5 mg/dL (ref 0.2–1.2)
BUN/Creatinine Ratio: 13 (calc) (ref 6–22)
BUN: 13 mg/dL (ref 7–25)
CHLORIDE: 103 mmol/L (ref 98–110)
CO2: 29 mmol/L (ref 20–32)
Calcium: 10.1 mg/dL (ref 8.6–10.4)
Creat: 0.97 mg/dL — ABNORMAL HIGH (ref 0.60–0.88)
GFR, Est African American: 63 mL/min/{1.73_m2} (ref >=60)
GFR, Est Non African American: 54 mL/min/{1.73_m2} — ABNORMAL LOW (ref >=60)
GLUCOSE: 93 mg/dL (ref 65–99)
Globulin: 2.7 g/dL (calc) (ref 1.9–3.7)
POTASSIUM: 4 mmol/L (ref 3.5–5.3)
Sodium: 142 mmol/L (ref 135–146)
Total Protein: 7.4 g/dL (ref 6.1–8.1)

## 2018-01-23 LAB — CBC WITH DIFFERENTIAL/PLATELET
Basophils Absolute: 88 cells/uL (ref 0–200)
Basophils Relative: 1.4 %
EOS PCT: 3.2 %
Eosinophils Absolute: 202 cells/uL (ref 15–500)
HEMATOCRIT: 37.1 % (ref 35.0–45.0)
Hemoglobin: 12.5 g/dL (ref 11.7–15.5)
LYMPHS ABS: 1481 {cells}/uL (ref 850–3900)
MCH: 30 pg (ref 27.0–33.0)
MCHC: 33.7 g/dL (ref 32.0–36.0)
MCV: 89 fL (ref 80.0–100.0)
MONOS PCT: 7.3 %
MPV: 9.4 fL (ref 7.5–12.5)
NEUTROS ABS: 4070 {cells}/uL (ref 1500–7800)
Neutrophils Relative %: 64.6 %
Platelets: 235 10*3/uL (ref 140–400)
RBC: 4.17 10*6/uL (ref 3.80–5.10)
RDW: 12.8 % (ref 11.0–15.0)
Total Lymphocyte: 23.5 %
WBC mixed population: 460 cells/uL (ref 200–950)
WBC: 6.3 10*3/uL (ref 3.8–10.8)

## 2018-01-23 LAB — SEDIMENTATION RATE: SED RATE: 22 mm/h (ref 0–30)

## 2018-01-23 NOTE — Patient Instructions (Signed)
Referral to [facility] for further evaluation.  Continue clear liquids.  Stay well-hydrated.  Drink less water because it may cause nausea.  Do not take dicyclomine unless considerable cramping is present.  Finish antibiotics as prescribed by emergency department physician.  Advance to soft diet.

## 2018-01-23 NOTE — Progress Notes (Signed)
   Subjective:    [name], female    DOB: 08-19-1933, 84 y.o.   MRN: [id number]  HPI 84 year old Female had called me on April 16th about diarrhea gas and diarrhea onset April12th. No travel history. Thought perhaps it was viral and advised clear liquids. She called back April 18th saying she had diarrhea and nausea with lower abdominal pain and was advised to go to ED. Was  diagnosed with Enterocolitis on CT scan.Given IVFs.  Gastrointestinal panel was entirely negative for pathogens. She was placed on antibioitcs which she will finish tomorrow and has slowly improved.Was placed on Cipro, Flagy and Bentyl. CBC and c-met were normal.U/A had trace LE with 0-5 WBC per HPF.  Hx of anxiety. Hx hypothyroidism. Worried constantly about husband who is in poor health.  Diarrhea has subsided. No BM yesterday or today.  Review of Systems see above- is frustrated she is not well.  Last colonoscopy was by Dr. Olevia Perches in 2011.     Objective:   Physical Exam Abdomen is soft nondistended with well healed surgical incisions. No pain to deep palpation. No masses. No stool to guaiac.  She is alert and oriented.  She is able to dress and undress herself.  Pulse is regular.       Assessment & Plan:  Enterocolitis  Plan: Continue clear liquids and advance to soft diet.  Stay away from lots of water.  Drink ginger ale and 7-Up.  Stay well-hydrated.  Cut back on meningeal if not having abdominal cramping.  Referral made to [facility].  They will need follow-up colonoscopy.  Call if symptoms are not improving.  CBC and C met drawn today.  Urine has trace LE and culture was sent.  Finish antibiotics as prescribed.  Last day of antibiotics will be tomorrow.  30 minutes spent with patient.

## 2018-01-24 LAB — URINE CULTURE
MICRO NUMBER: 90501046
SPECIMEN QUALITY: ADEQUATE

## 2018-01-29 ENCOUNTER — Ambulatory Visit (INDEPENDENT_AMBULATORY_CARE_PROVIDER_SITE_OTHER): Payer: Medicare Other | Admitting: Gastroenterology

## 2018-01-29 ENCOUNTER — Encounter: Payer: Self-pay | Admitting: Gastroenterology

## 2018-01-29 ENCOUNTER — Other Ambulatory Visit: Payer: Medicare Other

## 2018-01-29 VITALS — BP 144/70 | HR 72 | Ht 61.75 in | Wt 135.4 lb

## 2018-01-29 DIAGNOSIS — R197 Diarrhea, unspecified: Secondary | ICD-10-CM

## 2018-01-29 DIAGNOSIS — R152 Fecal urgency: Secondary | ICD-10-CM | POA: Diagnosis not present

## 2018-01-29 DIAGNOSIS — K588 Other irritable bowel syndrome: Secondary | ICD-10-CM

## 2018-01-29 MED ORDER — COLESTIPOL HCL 1 G PO TABS: 1.0000 g | ORAL_TABLET | Freq: Every day | ORAL | 3 refills | Status: DC

## 2018-01-29 NOTE — Patient Instructions (Signed)
We have sent colestid to your pharmacy for you to take once daily at bedtime  Go to the basement for labs today  Eat small frequent meals  Avoid a high fiber diet  We will refer you to Pelvic Floor therapy if symptoms do not improve   If you are age 82 or older, your body mass index should be between 23-30. Your Body mass index is 24.96 kg/m. If this is out of the aforementioned range listed, please consider follow up with your Primary Care Provider.  If you are age 18 or younger, your body mass index should be between 19-25. Your Body mass index is 24.96 kg/m. If this is out of the aformentioned range listed, please consider follow up with your Primary Care Provider.

## 2018-01-29 NOTE — Progress Notes (Signed)
Dominique Livingston    [id number]    July 16, 1933  Primary Care Physician:Baxley, Luanna Cole, MD  Referring Physician: Margaree Mackintosh, MD [address] [place], [place] [address]  Chief complaint:  Abdominal pain, nausea, fecal incontinence, Diarrhea  HPI:  84 year old female with history of endometriosis s/p ex lap about 40-50 years ago, sigmoid diverticulosis  She had Flu in Feb. Her husband also had Flu and he has been in and out of hospital for spesis and several infections She has about 3 bowel movements a day associated with fecal urgency, worse in the morning She is currently on soft diet with improvement of abdominal pain, nausea and also diarrhea Denies any vomiting, dysphagia, odynophagia, melena or blood per rectum.  CT abdomen pelvis January 17, 2018 showed fluid throughout the majority of nondistended small and large bowel raising the possibility of possible underlying enteritis, no bowel wall thickening or evidence of bowel wall inflammation.  There is subtle hazy edema in the lower abdominal mesentery  She underwent cholecystectomy on May 09, 2016  Abdominal ultrasound April 16, 2016 Cholelithiasis with a large stone in the gallbladder, mild pericholecystic edema, mass versus tumefactive sludge in the gallbladder   Colonoscopy January 18, 2010 with removal of 3 small sessile polyps in sigmoid colon and sigmoid diverticulosis  Outpatient Encounter Medications as of 01/29/2018  Medication Sig  . acetaminophen (TYLENOL) 500 MG tablet Take 1,000 mg by mouth every 6 (six) hours as needed.  . Cholecalciferol (VITAMIN D PO) Take 1 tablet by mouth daily.  Marland Kitchen levothyroxine (SYNTHROID, LEVOTHROID) 75 MCG tablet Take 1 tablet (75 mcg total) by mouth daily.  Marland Kitchen lisinopril (PRINIVIL,ZESTRIL) 5 MG tablet TAKE 1 TABLET BY MOUTH ONCE DAILY  . Multiple Vitamin (MULTIVITAMIN) tablet Take 1 tablet by mouth daily.  Bertram Gala Glycol-Propyl Glycol (SYSTANE ULTRA OP) Place 1  drop into both eyes 2 (two) times daily.  Marland Kitchen ALPRAZolam (XANAX) 0.25 MG tablet Take 1 tablet (0.25 mg total) by mouth 2 (two) times daily as needed for anxiety. (Patient not taking: Reported on 01/29/2018)  . dicyclomine (BENTYL) 20 MG tablet Take 1 tablet (20 mg total) by mouth 2 (two) times daily as needed for spasms. Abdominal pain (Patient not taking: Reported on 01/29/2018)   No facility-administered encounter medications on file as of 01/29/2018.     Allergies as of 01/29/2018 - Review Complete 01/29/2018  Allergen Reaction Noted  . Amoxicillin-pot clavulanate Nausea Only 02/02/2011  . Erythromycin Nausea And Vomiting 02/02/2011  . Mushroom extract complex  05/03/2016    Past Medical History:  Diagnosis Date  . Allergy   . Anxiety   . Arthritis    hands , stiffness in knees & hips   . Colon polyp, hyperplastic   . Diverticulosis   . Endometriosis   . Fibrocystic breast disease   . GERD (gastroesophageal reflux disease)   . Hyperlipidemia   . Hypertension   . Hypothyroidism   . IBS (irritable bowel syndrome)   . Polymyalgia rheumatica (HCC)     Past Surgical History:  Procedure Laterality Date  . ABDOMINAL HYSTERECTOMY    . BREAST BIOPSY Right   . CATARACT EXTRACTION, BILATERAL Bilateral    cataracts  . CHOLECYSTECTOMY N/A 05/09/2016   Procedure: LAPAROSCOPIC CHOLECYSTECTOMY WITH INTRAOPERATIVE CHOLANGIOGRAM;  Surgeon: Avel Peace, MD;  Location: [facility] OR;  Service: General;  Laterality: N/A;  . LEFT COLECTOMY     due to endometriosos     Family  History  Problem Relation Age of Onset  . Cancer Mother        type unknown  . Heart disease Father   . COPD Father   . Hypertension Daughter   . Breast cancer Neg Hx     Social History   Socioeconomic History  . Marital status: Married    Spouse name: Not on file  . Number of children: 2  . Years of education: Not on file  . Highest education level: Not on file  Occupational History  . Occupation: retired    Engineer, production  . Financial resource strain: Not on file  . Food insecurity:    Worry: Not on file    Inability: Not on file  . Transportation needs:    Medical: Not on file    Non-medical: Not on file  Tobacco Use  . Smoking status: Never Smoker  . Smokeless tobacco: Never Used  Substance and Sexual Activity  . Alcohol use: No  . Drug use: No  . Sexual activity: Not on file  Lifestyle  . Physical activity:    Days per week: Not on file    Minutes per session: Not on file  . Stress: Not on file  Relationships  . Social connections:    Talks on phone: Not on file    Gets together: Not on file    Attends religious service: Not on file    Active member of club or organization: Not on file    Attends meetings of clubs or organizations: Not on file    Relationship status: Not on file  . Intimate partner violence:    Fear of current or ex partner: Not on file    Emotionally abused: Not on file    Physically abused: Not on file    Forced sexual activity: Not on file  Other Topics Concern  . Not on file  Social History Narrative  . Not on file      Review of systems: Review of Systems  Constitutional: Negative for fever and chills.  HENT: Positive for sinus trouble, allergy Eyes: Negative for blurred vision.  Respiratory: Negative for cough, shortness of breath and wheezing.   Cardiovascular: Negative for chest pain and palpitations.  Gastrointestinal: as per HPI Genitourinary: Negative for dysuria, urgency, frequency and hematuria.  Musculoskeletal: Negative for myalgias, back pain and joint pain.  Skin: Negative for itching and rash.  Neurological: Negative for dizziness, tremors, focal weakness, seizures and loss of consciousness.  Endo/Heme/Allergies: Positive for seasonal allergies.  Psychiatric/Behavioral: Negative for depression, suicidal ideas and hallucinations.  All other systems reviewed and are negative.   Physical Exam: Vitals:   01/29/18 1002  BP:  (!) 144/70  Pulse: 72   Body mass index is 24.96 kg/m. Gen:      No acute distress HEENT:  EOMI, sclera anicteric Neck:     No masses; no thyromegaly Lungs:    Clear to auscultation bilaterally; normal respiratory effort CV:         Regular rate and rhythm; no murmurs Abd:      + bowel sounds; soft, non-tender; no palpable masses, no distension Ext:    No edema; adequate peripheral perfusion Skin:      Warm and dry; no rash Neuro: alert and oriented x 3 Psych: normal mood and affect  Data Reviewed:  Reviewed labs, radiology imaging, old records and pertinent past GI work up   Assessment and Plan/Recommendations:  84 year old female with history of endometriosis status post  ex lap with partial resection of intestine and hysterectomy about 40 to 50 years ago.  Recent CT with fluid-filled small intestine and some mesenteric haziness with no bowel wall thickening or distention likely secondary to adhesions and scar tissue. Check stool GI pathogen panel to exclude infectious etiology Status post cholecystectomy diarrhea and irritable bowel syndrome We will start Colestid 1 g twice daily Small frequent meals Avoid high-fiber diet  If continues to have episodes of fecal urgency even after resolution of diarrhea, will consider referral to pelvic floor PT to improve anal sphincter tone  Return in 3 months or sooner if needed  Greater than 50% of the time used for counseling as well as treatment plan and follow-up. She had multiple questions which were answered to her satisfaction  K. Scherry Ran , MD [phone]    CC: Margaree Mackintosh, MD

## 2018-01-30 ENCOUNTER — Other Ambulatory Visit: Payer: Self-pay | Admitting: Internal Medicine

## 2018-01-30 ENCOUNTER — Other Ambulatory Visit: Payer: Medicare Other

## 2018-01-30 DIAGNOSIS — R152 Fecal urgency: Secondary | ICD-10-CM | POA: Diagnosis not present

## 2018-01-30 DIAGNOSIS — R197 Diarrhea, unspecified: Secondary | ICD-10-CM

## 2018-01-30 DIAGNOSIS — K588 Other irritable bowel syndrome: Secondary | ICD-10-CM | POA: Diagnosis not present

## 2018-01-31 LAB — GASTROINTESTINAL PATHOGEN PANEL PCR
C. difficile Tox A/B, PCR: NOT DETECTED
Campylobacter, PCR: NOT DETECTED
Cryptosporidium, PCR: NOT DETECTED
E COLI (ETEC) LT/ST, PCR: NOT DETECTED
E coli (STEC) stx1/stx2, PCR: NOT DETECTED
E coli 0157, PCR: NOT DETECTED
Giardia lamblia, PCR: NOT DETECTED
NOROVIRUS, PCR: NOT DETECTED
ROTAVIRUS, PCR: NOT DETECTED
Salmonella, PCR: NOT DETECTED
Shigella, PCR: NOT DETECTED

## 2018-02-08 ENCOUNTER — Ambulatory Visit (INDEPENDENT_AMBULATORY_CARE_PROVIDER_SITE_OTHER): Payer: Medicare Other | Admitting: Internal Medicine

## 2018-02-08 VITALS — BP 130/60 | HR 74 | Temp 97.9°F | Ht 61.75 in | Wt 132.0 lb

## 2018-02-08 DIAGNOSIS — K529 Noninfective gastroenteritis and colitis, unspecified: Secondary | ICD-10-CM

## 2018-02-08 DIAGNOSIS — R945 Abnormal results of liver function studies: Secondary | ICD-10-CM

## 2018-02-08 DIAGNOSIS — E039 Hypothyroidism, unspecified: Secondary | ICD-10-CM

## 2018-02-08 DIAGNOSIS — R7989 Other specified abnormal findings of blood chemistry: Secondary | ICD-10-CM

## 2018-02-08 LAB — HEPATIC FUNCTION PANEL
AG Ratio: 1.8 (calc) (ref 1.0–2.5)
ALKALINE PHOSPHATASE (APISO): 58 U/L (ref 33–130)
ALT: 26 U/L (ref 6–29)
AST: 26 U/L (ref 10–35)
Albumin: 4.9 g/dL (ref 3.6–5.1)
BILIRUBIN DIRECT: 0.1 mg/dL (ref 0.0–0.2)
BILIRUBIN INDIRECT: 0.6 mg/dL (ref 0.2–1.2)
Globulin: 2.8 g/dL (calc) (ref 1.9–3.7)
Total Bilirubin: 0.7 mg/dL (ref 0.2–1.2)
Total Protein: 7.7 g/dL (ref 6.1–8.1)

## 2018-02-08 LAB — TSH: TSH: 0.19 m[IU]/L — AB (ref 0.40–4.50)

## 2018-02-08 NOTE — Progress Notes (Signed)
   Subjective:    [name], female    DOB: 05-04-1933, 84 y.o.   MRN: [id number]  HPI Feeling better since last visit in late April.  Was diagnosed with enterocolitis on CT scan in the emergency department April 18 treated with Cipro and Flagyl.  She has also seen Gastroenterologist.  Has complaint some indigestion described as a little discomfort for about an hour.Not taking Colestid as prescribed by GI because worried about constipation.  Had elevated SGOT and SGPT  with enterocolitis recently and will be repeated today. Wants thyroid checked today as well.  Gastrointestinal panel May 1 was negative for enteric pathogens.  History of anxiety   Review of Systems see above advancing diet slowly     Objective:   Physical Exam Abdomen is soft nondistended without hepatosplenomegaly masses or tenderness.  No thyromegaly.       Assessment & Plan:  Recent enterocolitis with elevated liver functions-liver functions are now normal  Hypothyroidism-TSH is normal  Plan: Continue to advance diet.  Patient reassured

## 2018-02-11 ENCOUNTER — Other Ambulatory Visit: Payer: Self-pay

## 2018-02-11 MED ORDER — LEVOTHYROXINE SODIUM 50 MCG PO TABS: 50.0000 ug | ORAL_TABLET | Freq: Every day | ORAL | 0 refills | Status: DC

## 2018-02-27 ENCOUNTER — Encounter: Payer: Self-pay | Admitting: Internal Medicine

## 2018-02-27 NOTE — Patient Instructions (Signed)
Continue to advance diet.  TSH and liver functions are now normal.

## 2018-03-06 ENCOUNTER — Other Ambulatory Visit: Payer: Self-pay

## 2018-03-06 DIAGNOSIS — E039 Hypothyroidism, unspecified: Secondary | ICD-10-CM

## 2018-03-22 ENCOUNTER — Other Ambulatory Visit: Payer: Self-pay | Admitting: Internal Medicine

## 2018-03-22 NOTE — Telephone Encounter (Signed)
Verbal order per Dr. Lenord FellersBaxley.  Ok to refill Lisinopril 5mg  #90, 1 refill.  Sending to Araceli to e-scribe.

## 2018-03-25 ENCOUNTER — Other Ambulatory Visit: Payer: Medicare Other | Admitting: Internal Medicine

## 2018-03-25 DIAGNOSIS — E039 Hypothyroidism, unspecified: Secondary | ICD-10-CM

## 2018-03-26 ENCOUNTER — Ambulatory Visit (INDEPENDENT_AMBULATORY_CARE_PROVIDER_SITE_OTHER): Payer: Medicare Other | Admitting: Internal Medicine

## 2018-03-26 ENCOUNTER — Encounter: Payer: Self-pay | Admitting: Internal Medicine

## 2018-03-26 VITALS — BP 100/60 | HR 69 | Temp 98.3°F | Ht 61.75 in | Wt 134.0 lb

## 2018-03-26 DIAGNOSIS — E039 Hypothyroidism, unspecified: Secondary | ICD-10-CM

## 2018-03-26 DIAGNOSIS — R7989 Other specified abnormal findings of blood chemistry: Secondary | ICD-10-CM | POA: Diagnosis not present

## 2018-03-26 LAB — TSH: TSH: 9.74 mIU/L — ABNORMAL HIGH (ref 0.40–4.50)

## 2018-03-26 MED ORDER — LEVOTHYROXINE SODIUM 75 MCG PO TABS
75.0000 ug | ORAL_TABLET | Freq: Every day | ORAL | 3 refills | Status: DC
Start: 2018-03-26 — End: 2019-03-17

## 2018-03-26 NOTE — Patient Instructions (Signed)
Increase thyroid replacement back to 0.075 mg daily and follow-up at time of physical exam in mid August.
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
## 2018-04-18 ENCOUNTER — Encounter: Payer: Medicare Other | Admitting: Internal Medicine

## 2018-05-16 ENCOUNTER — Encounter: Payer: Medicare Other | Admitting: Internal Medicine

## 2018-05-20 ENCOUNTER — Other Ambulatory Visit: Payer: Self-pay | Admitting: Internal Medicine

## 2018-05-20 DIAGNOSIS — I10 Essential (primary) hypertension: Secondary | ICD-10-CM

## 2018-05-20 DIAGNOSIS — E785 Hyperlipidemia, unspecified: Secondary | ICD-10-CM

## 2018-05-20 DIAGNOSIS — Z0000 Encounter for general adult medical examination without abnormal findings: Secondary | ICD-10-CM

## 2018-05-20 DIAGNOSIS — I6523 Occlusion and stenosis of bilateral carotid arteries: Secondary | ICD-10-CM

## 2018-05-20 DIAGNOSIS — F411 Generalized anxiety disorder: Secondary | ICD-10-CM

## 2018-05-20 DIAGNOSIS — M19042 Primary osteoarthritis, left hand: Secondary | ICD-10-CM

## 2018-05-20 DIAGNOSIS — M19041 Primary osteoarthritis, right hand: Secondary | ICD-10-CM

## 2018-05-20 DIAGNOSIS — E039 Hypothyroidism, unspecified: Secondary | ICD-10-CM

## 2018-05-20 DIAGNOSIS — M8580 Other specified disorders of bone density and structure, unspecified site: Secondary | ICD-10-CM

## 2018-05-20 DIAGNOSIS — F439 Reaction to severe stress, unspecified: Secondary | ICD-10-CM

## 2018-05-23 ENCOUNTER — Ambulatory Visit (INDEPENDENT_AMBULATORY_CARE_PROVIDER_SITE_OTHER): Payer: Medicare Other | Admitting: Internal Medicine

## 2018-05-23 ENCOUNTER — Encounter: Payer: Self-pay | Admitting: Internal Medicine

## 2018-05-23 VITALS — BP 120/70 | HR 70 | Ht 61.5 in | Wt 134.0 lb

## 2018-05-23 DIAGNOSIS — M19042 Primary osteoarthritis, left hand: Secondary | ICD-10-CM | POA: Diagnosis not present

## 2018-05-23 DIAGNOSIS — Z0000 Encounter for general adult medical examination without abnormal findings: Secondary | ICD-10-CM | POA: Diagnosis not present

## 2018-05-23 DIAGNOSIS — F411 Generalized anxiety disorder: Secondary | ICD-10-CM | POA: Diagnosis not present

## 2018-05-23 DIAGNOSIS — M19041 Primary osteoarthritis, right hand: Secondary | ICD-10-CM

## 2018-05-23 DIAGNOSIS — J309 Allergic rhinitis, unspecified: Secondary | ICD-10-CM | POA: Diagnosis not present

## 2018-05-23 DIAGNOSIS — Z789 Other specified health status: Secondary | ICD-10-CM | POA: Diagnosis not present

## 2018-05-23 DIAGNOSIS — T63481A Toxic effect of venom of other arthropod, accidental (unintentional), initial encounter: Secondary | ICD-10-CM | POA: Diagnosis not present

## 2018-05-23 DIAGNOSIS — I10 Essential (primary) hypertension: Secondary | ICD-10-CM | POA: Diagnosis not present

## 2018-05-23 DIAGNOSIS — E785 Hyperlipidemia, unspecified: Secondary | ICD-10-CM

## 2018-05-23 DIAGNOSIS — I6523 Occlusion and stenosis of bilateral carotid arteries: Secondary | ICD-10-CM | POA: Diagnosis not present

## 2018-05-23 DIAGNOSIS — F439 Reaction to severe stress, unspecified: Secondary | ICD-10-CM

## 2018-05-23 DIAGNOSIS — E039 Hypothyroidism, unspecified: Secondary | ICD-10-CM | POA: Diagnosis not present

## 2018-05-23 DIAGNOSIS — M8580 Other specified disorders of bone density and structure, unspecified site: Secondary | ICD-10-CM

## 2018-05-23 DIAGNOSIS — R8290 Unspecified abnormal findings in urine: Secondary | ICD-10-CM | POA: Diagnosis not present

## 2018-05-23 DIAGNOSIS — Z8739 Personal history of other diseases of the musculoskeletal system and connective tissue: Secondary | ICD-10-CM | POA: Diagnosis not present

## 2018-05-23 DIAGNOSIS — K582 Mixed irritable bowel syndrome: Secondary | ICD-10-CM

## 2018-05-23 LAB — POCT URINALYSIS DIPSTICK
BILIRUBIN UA: NEGATIVE
Blood, UA: NEGATIVE
Glucose, UA: NEGATIVE
Ketones, UA: NEGATIVE
NITRITE UA: NEGATIVE
PROTEIN UA: NEGATIVE
Spec Grav, UA: 1.015 (ref 1.010–1.025)
UROBILINOGEN UA: 0.2 U/dL
pH, UA: 6 (ref 5.0–8.0)

## 2018-05-23 MED ORDER — HYOSCYAMINE SULFATE SL 0.125 MG SL SUBL: SUBLINGUAL_TABLET | SUBLINGUAL | 0 refills | Status: DC

## 2018-05-23 NOTE — Progress Notes (Signed)
Subjective:    [name], female    DOB: 1933-04-08, 84 y.o.   MRN: [id number]  HPI  84 year old Female for routine health maintenance,Medicare wellness and evaluation of medical issues. Declines flu vaccine.  Hx GERD, HTN, hypothyroidism, osteopenia, allergic rhinitis, hand arthritis, allergic rhinitis, anxiety, situational stress with husband's health, insomnia related to anxiety. In April she had an episode of enterocolitis that  took some time to resolve.  Long-standing history of irritable bowel syndrome.  May want to try some Levsin for that.  She is statin intolerant and cannot afford Zetia.  We have agreed we will simply watch her cholesterol.  History of polymyalgia rheumatica diagnosed in 2001  resolved and never recurred.  In February she was diagnosed with influenza A.  Intolerant of Fosamax-causes an adverse reaction.  Augmentin causes nausea.  Colonoscopy 2011.  2 surgeries for endometriosis in the remote past.  One surgery for endometriosis involving left salpingo-oophorectomy.  During that time there was obstruction of the sigmoid colon which resulted in a left colectomy by Dr. Rolene Course in 1969.  The patient subsequently had hysterectomy and right salpingo-oophorectomy performed by Dr. Mickel Crow.  She had an ovarian cyst drained in the late 1960s.  Had excisional biopsy of the right breast showing fibrocystic breast disease in 1979.  Cataract extraction right eye February 2006 and left eye April 2006    SHx: Married. Husband in poor health but no recent hospitalizations. She is anxious about his health and forgetfulness.  2 adult children, son and a daughter.  Daughter is a patient here.  She does not smoke or consume alcohol.  Husband is a retired Company secretary.  She is a Futures trader.  She has had allergy testing positive for trees, grasses, chocolate, green peas, watermelon and cantaloupe.  Family history: Father died at age 44 with pneumonia/COPD/heart  problems.  Mother died at age 60 of cancer that was metastatic from unknown primary.  2 brothers with history of hypertension.  Daughter with hypertension and allergies.         Review of Systems fire ants stung her last week on right foot. No evidence of cellulitis.  Always anxious.     Objective:   Physical Exam  Constitutional: She is oriented to person, place, and time. She appears well-developed and well-nourished.  HENT:  Head: Normocephalic and atraumatic.  Right Ear: External ear normal.  Left Ear: External ear normal.  Mouth/Throat: Oropharynx is clear and moist.  Eyes: Pupils are equal, round, and reactive to light. Conjunctivae and EOM are normal.  Neck: No JVD present. No thyromegaly present.  Cardiovascular: Normal rate, regular rhythm and normal heart sounds.  No murmur heard. Pulmonary/Chest: Effort normal and breath sounds normal. No respiratory distress. She has no wheezes. She has no rales.  Breast without masses  Abdominal: Soft. Bowel sounds are normal. She exhibits no distension and no mass. There is no tenderness. There is no rebound and no guarding.  Genitourinary:  Genitourinary Comments: Pap deferred due to age.  Status post hysterectomy and BSO.  Bimanual not done.  Musculoskeletal: She exhibits no edema.  Bilateral Heberden's and Bouchard's nodes  Lymphadenopathy:    She has no cervical adenopathy.  Neurological: She is alert and oriented to person, place, and time. She displays normal reflexes. No cranial nerve deficit. She exhibits normal muscle tone. Coordination normal.  Psychiatric: She has a normal mood and affect. Her behavior is normal. Judgment and thought content normal.  Anxious  Vitals reviewed.         Assessment & Plan:  Osteoarthritis of hands  Allergic rhinitis  Anxiety state  Hypothyroidism-stable on thyroid replacement therapy  Hyperlipidemia-statin intolerant and cannot afford Zetia  GE reflux  Irritable bowel  syndrome-try Levsin  Osteopenia-intolerant of Fosamax  Essential hypertension-stable on lisinopril  History of polymyalgia rheumatica-resolved  Plan: Continue same medications and return in 1 year or as needed.  Subjective:   Patient presents for Medicare Annual/Subsequent preventive examination.  Review Past Medical/Family/Social: See above   Risk Factors  Current exercise habits: Sedentary Dietary issues discussed: Low-fat low-carb  Cardiac risk factors: Hyperlipidemia  Depression Screen  (Note: if answer to either of the following is "Yes", a more complete depression screening is indicated)   Over the past two weeks, have you felt down, depressed or hopeless? No  Over the past two weeks, have you felt little interest or pleasure in doing things? No Have you lost interest or pleasure in daily life? No Do you often feel hopeless? No Do you cry easily over simple problems? No   Activities of Daily Living  In your present state of health, do you have any difficulty performing the following activities?:   Driving? No  Managing money? No  Feeding yourself? No  Getting from bed to chair? No  Climbing a flight of stairs? No  Preparing food and eating?: No  Bathing or showering? No  Getting dressed: No  Getting to the toilet? No  Using the toilet:No  Moving around from place to place: No  In the past year have you fallen or had a near fall?:No  Are you sexually active? No  Do you have more than one partner? No   Hearing Difficulties: No  Do you often ask people to speak up or repeat themselves? No  Do you experience ringing or noises in your ears? No  Do you have difficulty understanding soft or whispered voices? No  Do you feel that you have a problem with memory? No Do you often misplace items? No    Home Safety:  Do you have a smoke alarm at your residence? Yes Do you have grab bars in the bathroom?  Yes Do you have throw rugs in your house?  No   Cognitive  Testing  Alert? Yes Normal Appearance?Yes  Oriented to person? Yes Place? Yes  Time? Yes  Recall of three objects? Yes  Can perform simple calculations? Yes  Displays appropriate judgment?Yes  Can read the correct time from a watch face?Yes   List the Names of Other Physician/Practitioners you currently use:  See referral list for the physicians patient is currently seeing.     Review of Systems: See above   Objective:     General appearance: Appears stated age Head: Normocephalic, without obvious abnormality, atraumatic  Eyes: conj clear, EOMi PEERLA  Ears: normal TM's and external ear canals both ears  Nose: Nares normal. Septum midline. Mucosa normal. No drainage or sinus tenderness.  Throat: lips, mucosa, and tongue normal; teeth and gums normal  Neck: no adenopathy, no carotid bruit, no JVD, supple, symmetrical, trachea midline and thyroid not enlarged, symmetric, no tenderness/mass/nodules  No CVA tenderness.  Lungs: clear to auscultation bilaterally  Breasts: normal appearance, no masses or tenderness Heart: regular rate and rhythm, S1, S2 normal, no murmur, click, rub or gallop  Abdomen: soft, non-tender; bowel sounds normal; no masses, no organomegaly  Musculoskeletal: ROM normal in all joints, no crepitus, no deformity, Normal muscle strengthen.  Back  is symmetric, no curvature. Skin: Skin color, texture, turgor normal. No rashes or lesions  Lymph nodes: Cervical, supraclavicular, and axillary nodes normal.  Neurologic: CN 2 -12 Normal, Normal symmetric reflexes. Normal coordination and gait  Psych: Alert & Oriented x 3, Mood appear stable.    Assessment:    Annual wellness medicare exam   Plan:    During the course of the visit the patient was educated and counseled about appropriate screening and preventive services including:   Annual flu vaccine     Patient Instructions (the written plan) was given to the patient.  Medicare Attestation  I have  personally reviewed:  The patient's medical and social history  Their use of alcohol, tobacco or illicit drugs  Their current medications and supplements  The patient's functional ability including ADLs,fall risks, home safety risks, cognitive, and hearing and visual impairment  Diet and physical activities  Evidence for depression or mood disorders  The patient's weight, height, BMI, and visual acuity have been recorded in the chart. I have made referrals, counseling, and provided education to the patient based on review of the above and I have provided the patient with a written personalized care plan for preventive services.

## 2018-05-24 LAB — URINE CULTURE
MICRO NUMBER:: 91005576
SPECIMEN QUALITY:: ADEQUATE

## 2018-05-24 LAB — CBC WITH DIFFERENTIAL/PLATELET
BASOS ABS: 52 {cells}/uL (ref 0–200)
Basophils Relative: 0.9 %
Eosinophils Absolute: 133 cells/uL (ref 15–500)
Eosinophils Relative: 2.3 %
HCT: 38.7 % (ref 35.0–45.0)
Hemoglobin: 12.9 g/dL (ref 11.7–15.5)
Lymphs Abs: 1757 cells/uL (ref 850–3900)
MCH: 30.2 pg (ref 27.0–33.0)
MCHC: 33.3 g/dL (ref 32.0–36.0)
MCV: 90.6 fL (ref 80.0–100.0)
MPV: 8.9 fL (ref 7.5–12.5)
Monocytes Relative: 5.4 %
Neutro Abs: 3544 cells/uL (ref 1500–7800)
Neutrophils Relative %: 61.1 %
PLATELETS: 214 10*3/uL (ref 140–400)
RBC: 4.27 10*6/uL (ref 3.80–5.10)
RDW: 12.1 % (ref 11.0–15.0)
TOTAL LYMPHOCYTE: 30.3 %
WBC mixed population: 313 cells/uL (ref 200–950)
WBC: 5.8 10*3/uL (ref 3.8–10.8)

## 2018-05-24 LAB — COMPLETE METABOLIC PANEL WITH GFR
AG Ratio: 1.4 (calc) (ref 1.0–2.5)
ALBUMIN MSPROF: 4.7 g/dL (ref 3.6–5.1)
ALT: 15 U/L (ref 6–29)
AST: 18 U/L (ref 10–35)
Alkaline phosphatase (APISO): 61 U/L (ref 33–130)
BUN / CREAT RATIO: 24 (calc) — AB (ref 6–22)
BUN: 23 mg/dL (ref 7–25)
CO2: 31 mmol/L (ref 20–32)
CREATININE: 0.94 mg/dL — AB (ref 0.60–0.88)
Calcium: 9.7 mg/dL (ref 8.6–10.4)
Chloride: 102 mmol/L (ref 98–110)
GFR, EST NON AFRICAN AMERICAN: 56 mL/min/{1.73_m2} — AB (ref >=60)
GFR, Est African American: 65 mL/min/{1.73_m2} (ref >=60)
GLOBULIN: 3.3 g/dL (ref 1.9–3.7)
GLUCOSE: 92 mg/dL (ref 65–99)
Potassium: 4.5 mmol/L (ref 3.5–5.3)
SODIUM: 142 mmol/L (ref 135–146)
Total Bilirubin: 0.5 mg/dL (ref 0.2–1.2)
Total Protein: 8 g/dL (ref 6.1–8.1)

## 2018-05-24 LAB — LIPID PANEL
CHOLESTEROL: 256 mg/dL — AB (ref <200)
HDL: 64 mg/dL (ref >50)
LDL Cholesterol (Calc): 156 mg/dL (calc) — ABNORMAL HIGH
Non-HDL Cholesterol (Calc): 192 mg/dL (calc) — ABNORMAL HIGH (ref <130)
Total CHOL/HDL Ratio: 4 (calc) (ref <5.0)
Triglycerides: 198 mg/dL — ABNORMAL HIGH (ref <150)

## 2018-05-24 LAB — TSH: TSH: 0.77 mIU/L (ref 0.40–4.50)

## 2018-05-30 NOTE — Patient Instructions (Signed)
It was a pleasure to see you today.  Continue same medications and return in 1 year or as needed.  Try Levsin for irritable bowel syndrome

## 2018-09-07 IMAGING — CT CT ABD-PELV W/ CM
2 of 5 series · 16 of 46 positions shown, 18 images · IV contrast (iopamidol)
Comparison: None.

CLINICAL DATA: Lower abdominal pain for 1 week, diarrhea and
nausea. History of colon resection, hysterectomy.

EXAM:
CT ABDOMEN AND PELVIS WITH CONTRAST
TECHNIQUE: Multidetector CT imaging of the abdomen and pelvis was performed
using the standard protocol following bolus administration of
intravenous contrast.
CONTRAST:  100mL KRM1CS-3II IOPAMIDOL (KRM1CS-3II) INJECTION 61%

[Series 3: a/p w/ 5mm · axial · 0.67mm/px · z∈[+819,+1184]mm · 13 of 83 slices shown, 15 images]
[im 5/83  soft-tissue]
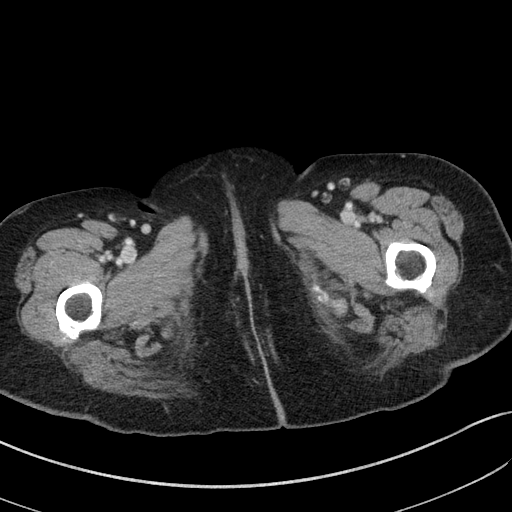
[im 5/83  bone]
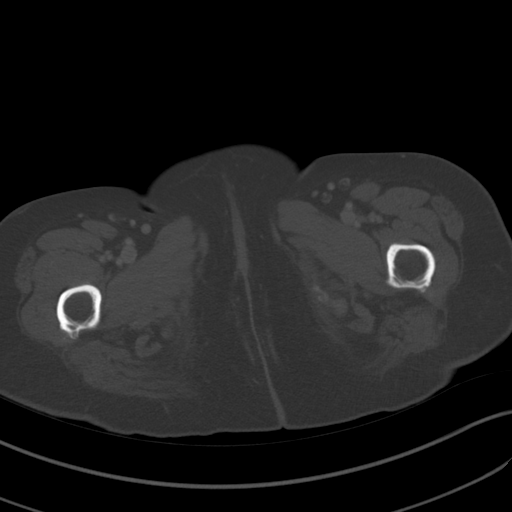
[im 10/83  soft-tissue]
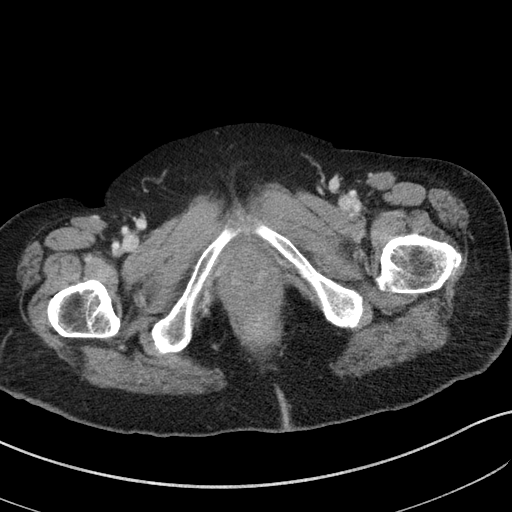
[im 19/83  soft-tissue]
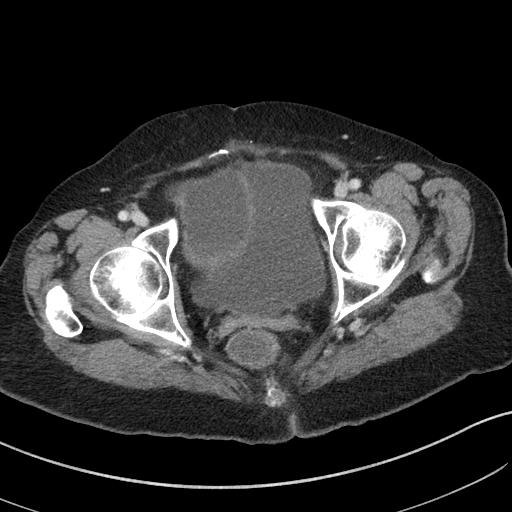
[im 23/83  soft-tissue]
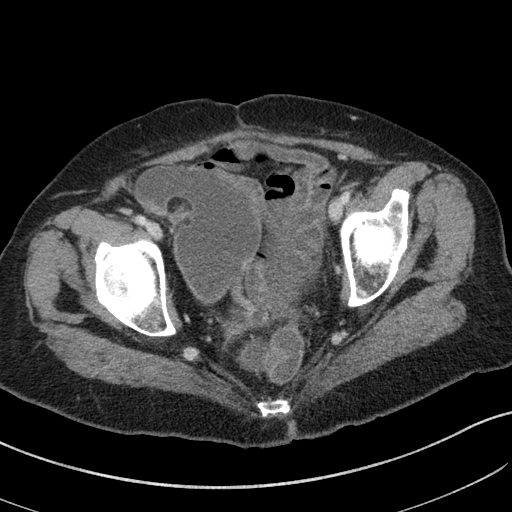
[im 28/83  soft-tissue]
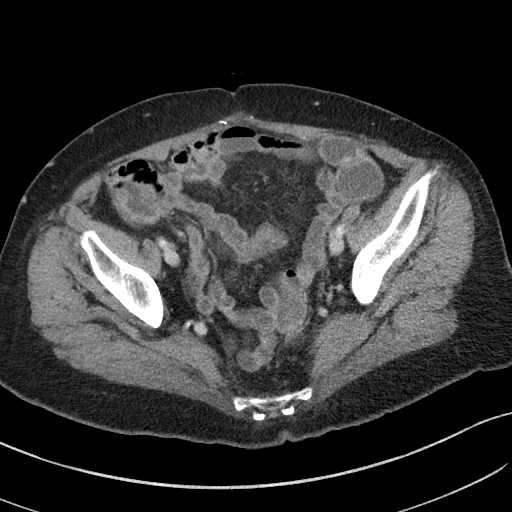
[im 37/83  soft-tissue]
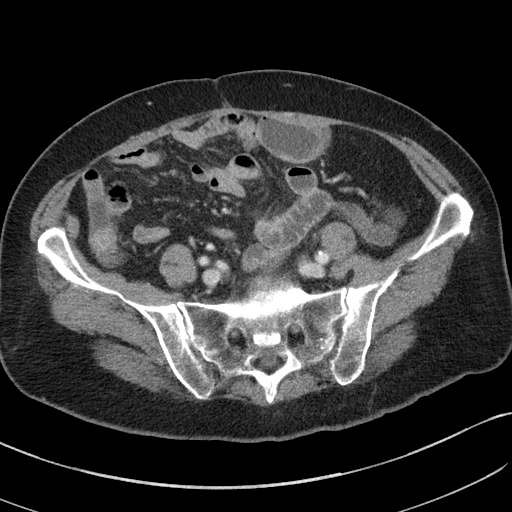
[im 42/83  soft-tissue]
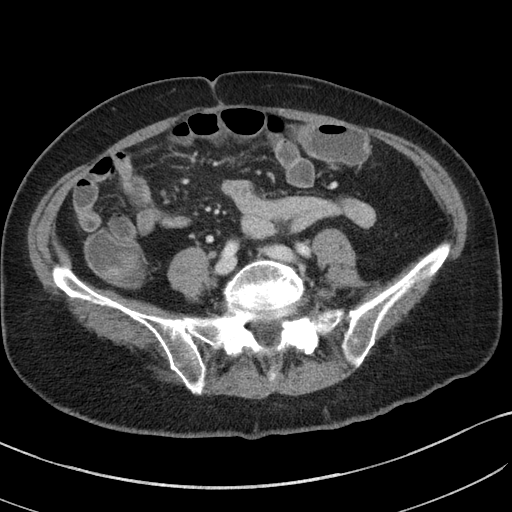
[im 46/83  soft-tissue]
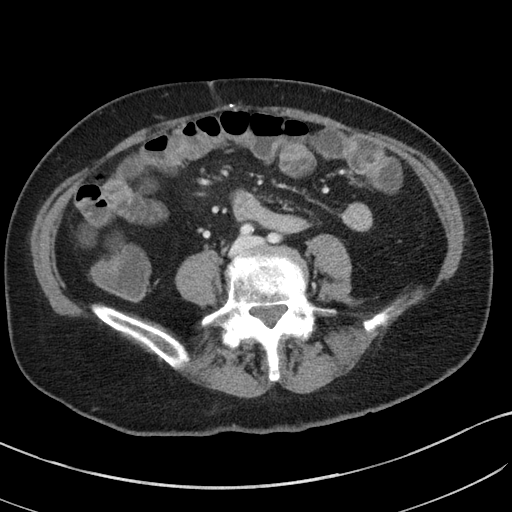
[im 55/83  soft-tissue]
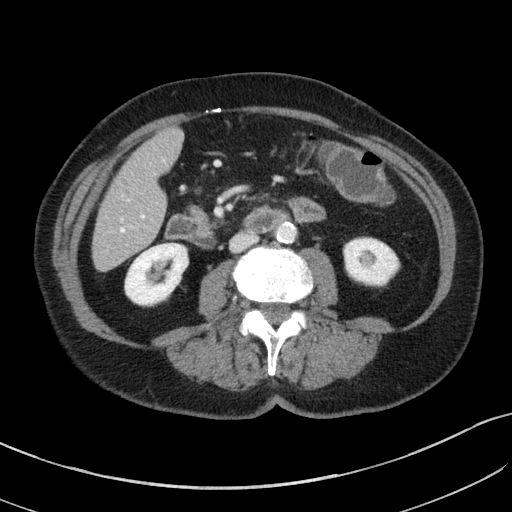
[im 55/83  bone]
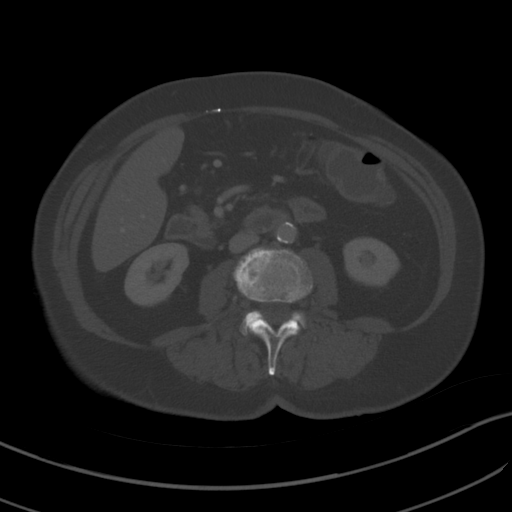
[im 60/83  soft-tissue]
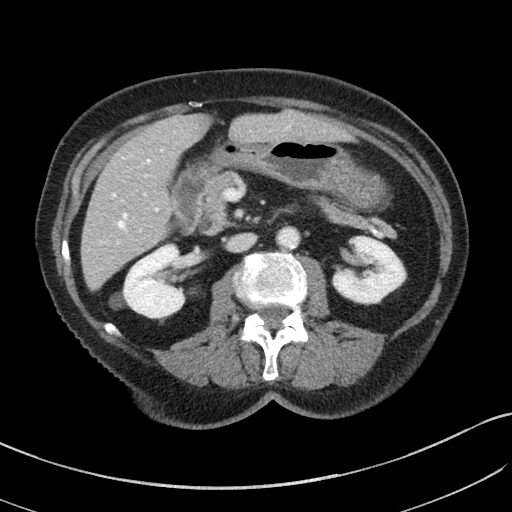
[im 64/83  soft-tissue]
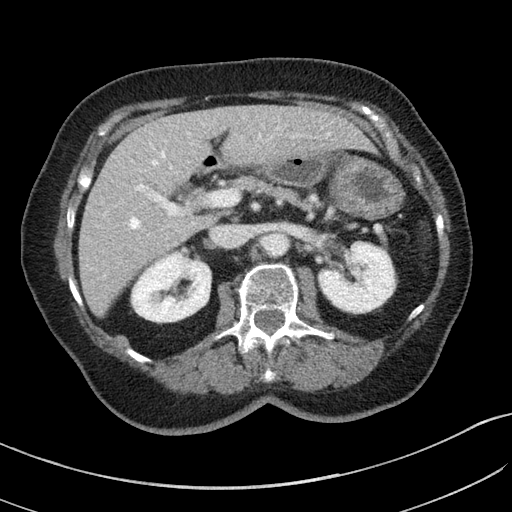
[im 73/83  soft-tissue]
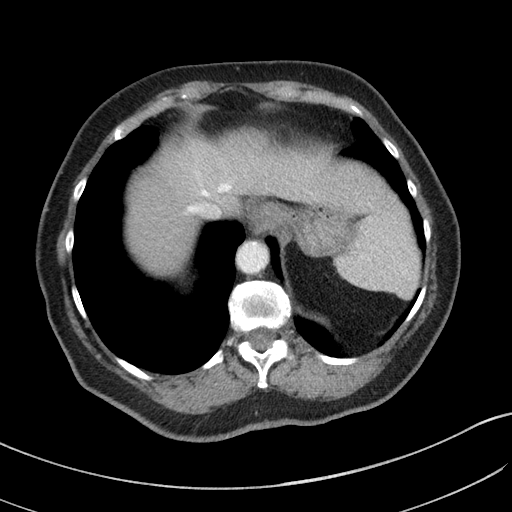
[im 78/83  soft-tissue]
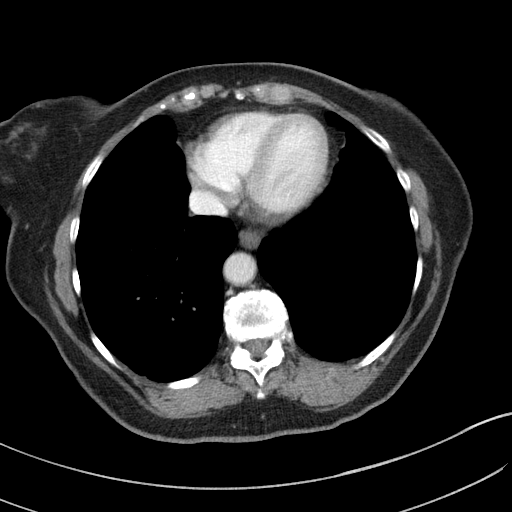

[Series 6: a/p w/ cor · coronal · 0.70mm/px · 3 of 138 slices shown]
[im 46/138  soft-tissue]
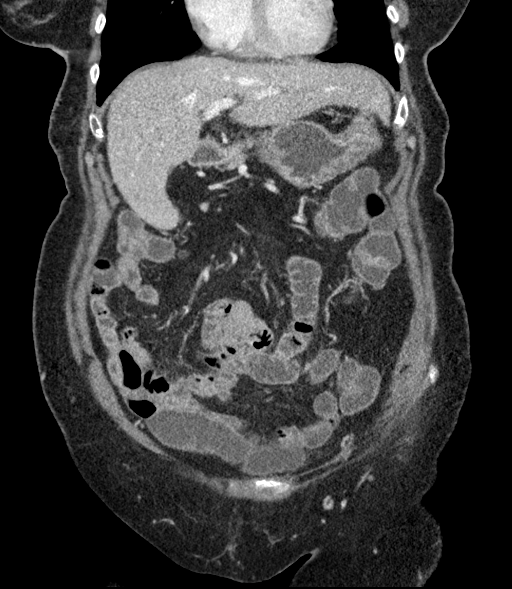
[im 61/138  soft-tissue]
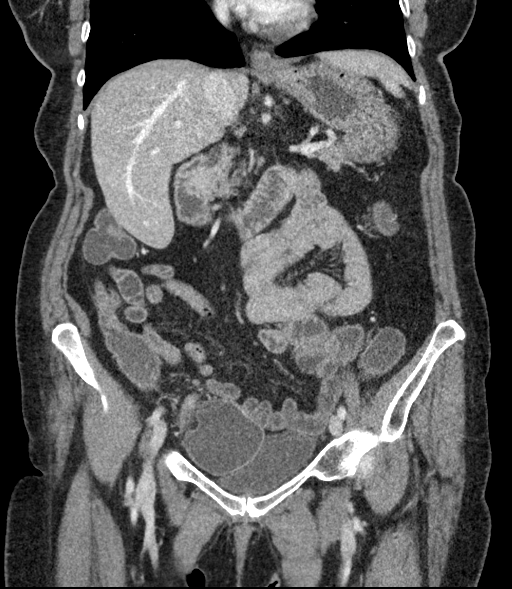
[im 77/138  soft-tissue]
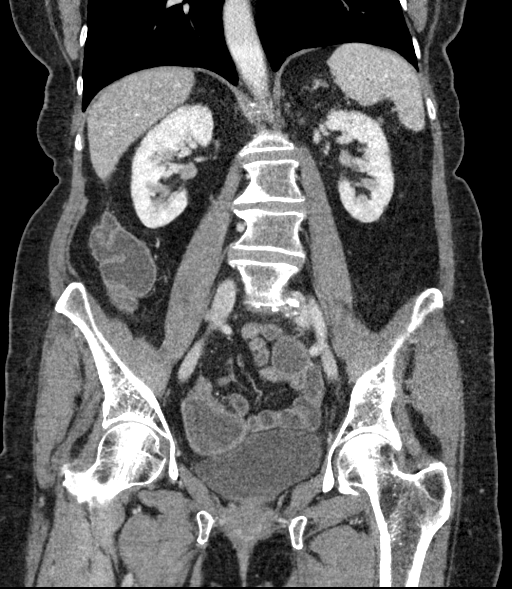

[16 of 46 positions shown; findings below may reference images not displayed]

FINDINGS: Lower chest: No acute abnormality.

Hepatobiliary: No focal liver abnormality is seen. Status post
cholecystectomy. No biliary dilatation.

Pancreas: Unremarkable. No pancreatic ductal dilatation or
surrounding inflammatory changes.

Spleen: Normal in size without focal abnormality.

Adrenals/Urinary Tract: Adrenal glands appear normal. Bilateral
renal cysts. No renal stone or hydronephrosis bilaterally. No
ureteral or bladder calculi identified. Bladder is unremarkable.

Stomach/Bowel: No dilated large or small bowel loops. Fluid
throughout the majority of the nondistended small and large bowel
raising the possibility of underlying enteritis. No bowel wall
thickening or evidence of bowel wall inflammation seen. Appendix is
not seen.

Vascular/Lymphatic: Aortic atherosclerosis. No enlarged abdominal or
pelvic lymph nodes.

Reproductive: Status post hysterectomy. No adnexal masses.

Other: No free fluid or abscess collection. No free intraperitoneal
air. Subtle haziness within the lower abdominal mesentery,
suggesting mild mesenteric edema.

Musculoskeletal: Degenerative changes throughout the slightly
scoliotic thoracolumbar spine, mild to moderate in degree. No acute
or suspicious osseous finding.
IMPRESSION: 1. Fluid throughout the nondistended large and small bowel. This can
be an indication of underlying gastroenteritis and/or enterocolitis,
and there is subtle hazy edema within the lower abdominal mesentery
which supports the diagnosis of an adjacent enterocolitis.
2. No bowel obstruction. No free fluid, abscess collection or free
intraperitoneal air. No evidence of acute solid organ abnormality.
3. Aortic atherosclerosis.
4. Additional chronic/incidental findings detailed above.

## 2018-10-10 ENCOUNTER — Other Ambulatory Visit: Payer: Self-pay | Admitting: Internal Medicine

## 2019-01-23 ENCOUNTER — Other Ambulatory Visit: Payer: Self-pay | Admitting: Internal Medicine

## 2019-03-17 ENCOUNTER — Other Ambulatory Visit: Payer: Self-pay | Admitting: Internal Medicine

## 2019-05-26 ENCOUNTER — Encounter: Payer: Self-pay | Admitting: Internal Medicine

## 2019-05-26 ENCOUNTER — Other Ambulatory Visit: Payer: Self-pay

## 2019-05-26 ENCOUNTER — Ambulatory Visit (INDEPENDENT_AMBULATORY_CARE_PROVIDER_SITE_OTHER): Payer: Medicare Other | Admitting: Internal Medicine

## 2019-05-26 VITALS — BP 120/70 | HR 82 | Temp 98.3°F | Ht 60.75 in | Wt 134.0 lb

## 2019-05-26 DIAGNOSIS — I10 Essential (primary) hypertension: Secondary | ICD-10-CM | POA: Diagnosis not present

## 2019-05-26 DIAGNOSIS — F439 Reaction to severe stress, unspecified: Secondary | ICD-10-CM

## 2019-05-26 DIAGNOSIS — I6523 Occlusion and stenosis of bilateral carotid arteries: Secondary | ICD-10-CM

## 2019-05-26 DIAGNOSIS — M19041 Primary osteoarthritis, right hand: Secondary | ICD-10-CM | POA: Diagnosis not present

## 2019-05-26 DIAGNOSIS — M19042 Primary osteoarthritis, left hand: Secondary | ICD-10-CM

## 2019-05-26 DIAGNOSIS — Z8739 Personal history of other diseases of the musculoskeletal system and connective tissue: Secondary | ICD-10-CM

## 2019-05-26 DIAGNOSIS — E039 Hypothyroidism, unspecified: Secondary | ICD-10-CM | POA: Diagnosis not present

## 2019-05-26 DIAGNOSIS — Z0000 Encounter for general adult medical examination without abnormal findings: Secondary | ICD-10-CM

## 2019-05-26 DIAGNOSIS — E785 Hyperlipidemia, unspecified: Secondary | ICD-10-CM

## 2019-05-26 DIAGNOSIS — R8290 Unspecified abnormal findings in urine: Secondary | ICD-10-CM

## 2019-05-26 DIAGNOSIS — F411 Generalized anxiety disorder: Secondary | ICD-10-CM

## 2019-05-26 DIAGNOSIS — Z789 Other specified health status: Secondary | ICD-10-CM | POA: Diagnosis not present

## 2019-05-26 DIAGNOSIS — J309 Allergic rhinitis, unspecified: Secondary | ICD-10-CM

## 2019-05-26 DIAGNOSIS — M8580 Other specified disorders of bone density and structure, unspecified site: Secondary | ICD-10-CM

## 2019-05-26 DIAGNOSIS — K582 Mixed irritable bowel syndrome: Secondary | ICD-10-CM | POA: Diagnosis not present

## 2019-05-26 LAB — POCT URINALYSIS DIPSTICK
Appearance: NEGATIVE
Bilirubin, UA: NEGATIVE
Blood, UA: NEGATIVE
Glucose, UA: NEGATIVE
Ketones, UA: NEGATIVE
Nitrite, UA: NEGATIVE
Odor: NEGATIVE
Protein, UA: NEGATIVE
Spec Grav, UA: 1.015 (ref 1.010–1.025)
Urobilinogen, UA: 0.2 E.U./dL
pH, UA: 6.5 (ref 5.0–8.0)

## 2019-05-26 MED ORDER — LISINOPRIL 5 MG PO TABS: 5.0000 mg | ORAL_TABLET | Freq: Every day | ORAL | 3 refills | Status: DC

## 2019-05-26 NOTE — Progress Notes (Signed)
Subjective:    [name], female    DOB: 1932-11-09, 85 y.o.   MRN: [id number]  HPI 85 year old Female for health maintenance exam and evaluation of medical issues.  Husband is ill at home with CHF and renal failure as well as mental confusion. Is under Hospice care. Her son and daughter are helping out with his care.  Pt is fatigued but staying strong. Trying to rest when she can. Urine specimen abnormal with LE and will be cultured but she is asymptomatic. May not be a clean catch.  Labs drawn and pending.  History of GE reflux, hypertension, hypothyroidism, osteopenia, allergic rhinitis, hand arthritis, anxiety, insomnia related to anxiety, situational stress.  Longstanding history of irritable bowel syndrome.  She is statin intolerant and cannot afford Zetia.  We have agreed we will simply watch her cholesterol.  History of polymyalgia rheumatica diagnosed in 2001 resolved never recurred.  In February she was diagnosed with influenza A.  Intolerant of Fosamax-causes an adverse reaction.  Augmentin causes nausea.  Colonoscopy 2011 and will not be repeated due to age.  To surgery for endometriosis in the remote past.  One surgery for endometriosis involving left salpingo-oophorectomy.  During that time there was obstruction of the sigmoid colon which resulted in a left colectomy by Dr. Rolene CourseFarley in 1969.  Patient subsequently had hysterectomy and right salpingo-oophorectomy performed by Dr. Mickel CrowFrancis Berry.  She had an ovarian cyst drained in the late 1960s.  Had excisional biopsy of the right breast showing fibrocystic breast disease in 1979.  Cataract extraction right eye in February 2006 and left eye April 2006.  Social history: Married.  Husband in for health under hospice care.  2 children a son and a daughter.  She does not smoke or consume alcohol.  Husband is a retired Company secretaryfireman.  She is a Futures traderhomemaker.  She has had allergy testing positive for trees, grasses,  chocolate, green peas, watermelon and cantaloupe.  Family history: Father died at age 864 with pneumonia/COPD/heart problems.  Mother died at age 83 cancer that was metastatic from unknown primary.  2 brothers with history of hypertension.  Daughter with history of hypertension and allergies.          Review of Systems  Constitutional: Positive for fatigue.  HENT: Negative.   Respiratory: Negative.   Cardiovascular: Negative.   Genitourinary: Negative.   Neurological: Negative.   Psychiatric/Behavioral:       History of anxiety       Objective:   Physical Exam Vitals signs reviewed.  Constitutional:      General: She is not in acute distress.    Appearance: Normal appearance. She is not toxic-appearing or diaphoretic.  HENT:     Head: Normocephalic and atraumatic.     Right Ear: Tympanic membrane normal.     Left Ear: Tympanic membrane normal.     Nose: Nose normal.     Mouth/Throat:     Mouth: Mucous membranes are moist.     Pharynx: Oropharynx is clear.  Eyes:     General: No scleral icterus.       Right eye: No discharge.        Left eye: No discharge.     Extraocular Movements: Extraocular movements intact.     Conjunctiva/sclera: Conjunctivae normal.     Pupils: Pupils are equal, round, and reactive to light.  Neck:     Musculoskeletal: Neck supple.     Vascular: No carotid bruit.  Comments: No carotid bruits.  No thyromegaly. Cardiovascular:     Rate and Rhythm: Normal rate and regular rhythm.     Heart sounds: Normal heart sounds. No murmur.     Comments: Breasts without masses Pulmonary:     Effort: Pulmonary effort is normal. No respiratory distress.     Breath sounds: Normal breath sounds. No wheezing or rales.  Abdominal:     General: Bowel sounds are normal.     Palpations: Abdomen is soft. There is no mass.     Tenderness: There is no abdominal tenderness. There is no guarding or rebound.  Genitourinary:    Comments: Bimanual deferred  Musculoskeletal:     Right lower leg: No edema.     Left lower leg: No edema.  Lymphadenopathy:     Cervical: No cervical adenopathy.  Skin:    General: Skin is warm and dry.     Findings: No rash.  Neurological:     General: No focal deficit present.     Mental Status: She is alert and oriented to person, place, and time.     Cranial Nerves: No cranial nerve deficit.     Motor: No weakness.     Gait: Gait normal.  Psychiatric:        Mood and Affect: Mood normal.        Behavior: Behavior normal.        Thought Content: Thought content normal.        Judgment: Judgment normal.           Assessment & Plan:  Anxiety state-secondary to husband's poor health and under hospice care.  Hypothyroidism-TSH drawn today  Allergic rhinitis  Osteopenia  History of insomnia related to stress  Situational stress with husband's health  Plan: Fasting labs drawn and pending.  Urine will be cultured because of family being present.  May not be a clean-catch.  Return in 6 months or as needed.  Subjective:   Patient presents for Medicare Annual/Subsequent preventive examination.  Review Past Medical/Family/Social: See above   Risk Factors  Current exercise habits: Not much exercise due to husband's illness Dietary issues discussed: Low-fat low carbohydrate  Cardiac risk factors: Hyperlipidemia but intolerant of statins and cannot afford Zetia  Depression Screen  (Note: if answer to either of the following is "Yes", a more complete depression screening is indicated)   Over the past two weeks, have you felt down, depressed or hopeless? No  Over the past two weeks, have you felt little interest or pleasure in doing things? No Have you lost interest or pleasure in daily life? No Do you often feel hopeless? No Do you cry easily over simple problems? No   Activities of Daily Living  In your present state of health, do you have any difficulty performing the following activities?:    Driving? No  Managing money? No  Feeding yourself? No  Getting from bed to chair? No  Climbing a flight of stairs? No  Preparing food and eating?: No  Bathing or showering? No  Getting dressed: No  Getting to the toilet? No  Using the toilet:No  Moving around from place to place: No  In the past year have you fallen or had a near fall?:No  Are you sexually active? No  Do you have more than one partner? No   Hearing Difficulties: No  Do you often ask people to speak up or repeat themselves? No  Do you experience ringing or noises in your ears?  No  Do you have difficulty understanding soft or whispered voices?  Yes Do you feel that you have a problem with memory? No Do you often misplace items? No    Home Safety:  Do you have a smoke alarm at your residence? Yes Do you have grab bars in the bathroom?  Yes Do you have throw rugs in your house?  None   Cognitive Testing  Alert? Yes Normal Appearance?Yes  Oriented to person? Yes Place? Yes  Time? Yes  Recall of three objects? Yes  Can perform simple calculations?  Not tested Displays appropriate judgment?Yes  Can read the correct time from a watch face?Yes   List the Names of Other Physician/Practitioners you currently use:  See referral list for the physicians patient is currently seeing.     Review of Systems: See above   Objective:     General appearance: Appears stated age and frail Head: Normocephalic, without obvious abnormality, atraumatic  Eyes: conj clear, EOMi PEERLA  Ears: normal TM's and external ear canals both ears  Nose: Nares normal. Septum midline. Mucosa normal. No drainage or sinus tenderness.  Throat: lips, mucosa, and tongue normal; teeth and gums normal  Neck: no adenopathy, no carotid bruit, no JVD, supple, symmetrical, trachea midline and thyroid not enlarged, symmetric, no tenderness/mass/nodules  No CVA tenderness.  Lungs: clear to auscultation bilaterally  Breasts: normal appearance,  no masses or tenderness Heart: regular rate and rhythm, S1, S2 normal, no murmur, click, rub or gallop  Abdomen: soft, non-tender; bowel sounds normal; no masses, no organomegaly  Musculoskeletal: ROM normal in all joints, no crepitus, no deformity, Normal muscle strengthen. Back  is symmetric, no curvature. Skin: Skin color, texture, turgor normal. No rashes or lesions  Lymph nodes: Cervical, supraclavicular, and axillary nodes normal.  Neurologic: CN 2 -12 Normal, Normal symmetric reflexes. Normal coordination and gait  Psych: Alert & Oriented x 3, Mood appear stable.    Assessment:    Annual wellness medicare exam   Plan:    During the course of the visit the patient was educated and counseled about appropriate screening and preventive services including:   Never takes a flu vaccine     Patient Instructions (the written plan) was given to the patient.  Medicare Attestation  I have personally reviewed:  The patient's medical and social history  Their use of alcohol, tobacco or illicit drugs  Their current medications and supplements  The patient's functional ability including ADLs,fall risks, home safety risks, cognitive, and hearing and visual impairment  Diet and physical activities  Evidence for depression or mood disorders  The patient's weight, height, BMI, and visual acuity have been recorded in the chart. I have made referrals, counseling, and provided education to the patient based on review of the above and I have provided the patient with a written personalized care plan for preventive services.

## 2019-05-26 NOTE — Patient Instructions (Addendum)
Urine has been cultured.  Continue current medications.  Labs drawn and pending.  Patient declines flu vaccine.  Return in 6 months.

## 2019-05-27 LAB — COMPLETE METABOLIC PANEL WITH GFR
AG Ratio: 1.8 (calc) (ref 1.0–2.5)
ALT: 13 U/L (ref 6–29)
AST: 18 U/L (ref 10–35)
Albumin: 4.9 g/dL (ref 3.6–5.1)
Alkaline phosphatase (APISO): 59 U/L (ref 37–153)
BUN/Creatinine Ratio: 24 (calc) — ABNORMAL HIGH (ref 6–22)
BUN: 22 mg/dL (ref 7–25)
CO2: 29 mmol/L (ref 20–32)
Calcium: 9.7 mg/dL (ref 8.6–10.4)
Chloride: 103 mmol/L (ref 98–110)
Creat: 0.9 mg/dL — ABNORMAL HIGH (ref 0.60–0.88)
GFR, Est African American: 68 mL/min/{1.73_m2} (ref >=60)
GFR, Est Non African American: 59 mL/min/{1.73_m2} — ABNORMAL LOW (ref >=60)
Globulin: 2.8 g/dL (calc) (ref 1.9–3.7)
Glucose, Bld: 93 mg/dL (ref 65–99)
Potassium: 4.7 mmol/L (ref 3.5–5.3)
Sodium: 142 mmol/L (ref 135–146)
Total Bilirubin: 0.6 mg/dL (ref 0.2–1.2)
Total Protein: 7.7 g/dL (ref 6.1–8.1)

## 2019-05-27 LAB — CBC WITH DIFFERENTIAL/PLATELET
Absolute Monocytes: 261 cells/uL (ref 200–950)
Basophils Absolute: 58 cells/uL (ref 0–200)
Basophils Relative: 1 %
Eosinophils Absolute: 209 cells/uL (ref 15–500)
Eosinophils Relative: 3.6 %
HCT: 38.7 % (ref 35.0–45.0)
Hemoglobin: 12.6 g/dL (ref 11.7–15.5)
Lymphs Abs: 1444 cells/uL (ref 850–3900)
MCH: 29.9 pg (ref 27.0–33.0)
MCHC: 32.6 g/dL (ref 32.0–36.0)
MCV: 91.7 fL (ref 80.0–100.0)
MPV: 9.5 fL (ref 7.5–12.5)
Monocytes Relative: 4.5 %
Neutro Abs: 3828 cells/uL (ref 1500–7800)
Neutrophils Relative %: 66 %
Platelets: 222 10*3/uL (ref 140–400)
RBC: 4.22 10*6/uL (ref 3.80–5.10)
RDW: 12.2 % (ref 11.0–15.0)
Total Lymphocyte: 24.9 %
WBC: 5.8 10*3/uL (ref 3.8–10.8)

## 2019-05-27 LAB — LIPID PANEL
Cholesterol: 267 mg/dL — ABNORMAL HIGH (ref <200)
HDL: 68 mg/dL (ref >=50)
LDL Cholesterol (Calc): 171 mg/dL (calc) — ABNORMAL HIGH
Non-HDL Cholesterol (Calc): 199 mg/dL (calc) — ABNORMAL HIGH (ref <130)
Total CHOL/HDL Ratio: 3.9 (calc) (ref <5.0)
Triglycerides: 138 mg/dL (ref <150)

## 2019-05-27 LAB — TSH: TSH: 1.41 mIU/L (ref 0.40–4.50)

## 2019-05-28 LAB — URINE CULTURE
MICRO NUMBER:: 803362
SPECIMEN QUALITY:: ADEQUATE

## 2019-05-29 ENCOUNTER — Other Ambulatory Visit: Payer: Self-pay

## 2019-05-29 MED ORDER — NITROFURANTOIN MONOHYD MACRO 100 MG PO CAPS: 100.0000 mg | ORAL_CAPSULE | Freq: Two times a day (BID) | ORAL | 0 refills | Status: DC

## 2019-06-20 ENCOUNTER — Ambulatory Visit: Payer: Medicare Other | Admitting: Internal Medicine

## 2019-06-23 ENCOUNTER — Other Ambulatory Visit: Payer: Self-pay | Admitting: Internal Medicine

## 2019-08-25 ENCOUNTER — Other Ambulatory Visit: Payer: Self-pay

## 2019-09-17 ENCOUNTER — Other Ambulatory Visit: Payer: Self-pay | Admitting: Internal Medicine

## 2019-12-02 ENCOUNTER — Other Ambulatory Visit: Payer: Self-pay | Admitting: Internal Medicine

## 2019-12-02 NOTE — Telephone Encounter (Signed)
Book CPE August before refilling

## 2019-12-04 ENCOUNTER — Other Ambulatory Visit: Payer: Self-pay

## 2019-12-16 ENCOUNTER — Ambulatory Visit: Payer: Medicare Other | Admitting: Internal Medicine

## 2019-12-19 ENCOUNTER — Other Ambulatory Visit: Payer: Self-pay

## 2019-12-19 ENCOUNTER — Ambulatory Visit (INDEPENDENT_AMBULATORY_CARE_PROVIDER_SITE_OTHER): Payer: Medicare Other | Admitting: Internal Medicine

## 2019-12-19 ENCOUNTER — Encounter: Payer: Self-pay | Admitting: Internal Medicine

## 2019-12-19 VITALS — BP 150/70 | HR 78 | Temp 98.3°F | Ht 60.75 in | Wt 129.0 lb

## 2019-12-19 DIAGNOSIS — F411 Generalized anxiety disorder: Secondary | ICD-10-CM | POA: Diagnosis not present

## 2019-12-19 DIAGNOSIS — E039 Hypothyroidism, unspecified: Secondary | ICD-10-CM

## 2019-12-19 DIAGNOSIS — F4320 Adjustment disorder, unspecified: Secondary | ICD-10-CM

## 2019-12-19 DIAGNOSIS — I10 Essential (primary) hypertension: Secondary | ICD-10-CM

## 2019-12-19 DIAGNOSIS — F4321 Adjustment disorder with depressed mood: Secondary | ICD-10-CM | POA: Diagnosis not present

## 2019-12-19 DIAGNOSIS — E785 Hyperlipidemia, unspecified: Secondary | ICD-10-CM | POA: Diagnosis not present

## 2019-12-19 DIAGNOSIS — E782 Mixed hyperlipidemia: Secondary | ICD-10-CM | POA: Diagnosis not present

## 2019-12-19 MED ORDER — ALPRAZOLAM 0.25 MG PO TABS: 0.2500 mg | ORAL_TABLET | Freq: Two times a day (BID) | ORAL | 5 refills | Status: DC | PRN

## 2019-12-19 NOTE — Progress Notes (Signed)
   Subjective:    [name], female    DOB: 1933/04/11, 86 y.o.   MRN: [id number]  HPI 86 year old female in for 67-month recheck.  History of hypertension, hyperlipidemia and hypothyroidism.  Fasting labs drawn today and pending.  Her husband died in the Fall 2020.  He had heart issues.  She is dealing with settling the estate.  She has a history of anxiety treated with Xanax.  Currently out of Xanax and not sleeping well.  I have refilled her Xanax.  She is residing alone.  Her daughter is supportive.  Patient continues to drive.  Patient was intolerant of statin medication in the remote past and we have simply been following her lipids.  She was unable to afford Zetia.  In August 2020 total cholesterol was 267, HDL 68, triglycerides 138 and LDL 171.  In years past she has had elevation of triglycerides as well.    Review of Systems see above     Objective:   Physical Exam  Patient is anxious in the office today.  Blood pressure 150/70 pulse 78 temperature 98.3 degrees orally pulse oximetry 98% weight 129 pounds BMI 24.58  Skin warm and dry.  Nodes none.  Neck is supple.  No thyromegaly.  No adenopathy.  Chest clear to auscultation without rales or wheezing.  Cardiac exam regular rate and rhythm normal S1 and S2 without murmurs or gallops.  No lower extremity edema.  Thought judgment and affect are normal but she is clearly grieving the loss of her husband and is anxious regarding the amount of work needed to settle his estate.    Assessment & Plan:  Anxiety state-represcribed Xanax for her 0.25 mg up to twice daily as needed.  She is having insomnia issues as well.  Hypothyroidism-TSH drawn and pending  History of hyperlipidemia-currently not on medication for this but fasting lipid panel drawn.  Plan: Review lab results and make further recommendations.  Otherwise see her in 6 months for Medicare wellness exam and health maintenance exam with fasting lab work.

## 2019-12-19 NOTE — Patient Instructions (Signed)
Take Xanax up to twice daily as needed for anxiety and/or sleep.  Fasting labs drawn and pending with further instructions to follow.  Physical exam and Medicare wellness exam due in 6 months.

## 2019-12-20 LAB — LIPID PANEL
Cholesterol: 270 mg/dL — ABNORMAL HIGH (ref <200)
HDL: 58 mg/dL (ref >=50)
LDL Cholesterol (Calc): 184 mg/dL (calc) — ABNORMAL HIGH
Non-HDL Cholesterol (Calc): 212 mg/dL (calc) — ABNORMAL HIGH (ref <130)
Total CHOL/HDL Ratio: 4.7 (calc) (ref <5.0)
Triglycerides: 138 mg/dL (ref <150)

## 2019-12-20 LAB — TSH: TSH: 1.81 mIU/L (ref 0.40–4.50)

## 2019-12-20 LAB — HEPATIC FUNCTION PANEL
AG Ratio: 1.7 (calc) (ref 1.0–2.5)
ALT: 12 U/L (ref 6–29)
AST: 18 U/L (ref 10–35)
Albumin: 4.8 g/dL (ref 3.6–5.1)
Alkaline phosphatase (APISO): 60 U/L (ref 37–153)
Bilirubin, Direct: 0.1 mg/dL (ref 0.0–0.2)
Globulin: 2.8 g/dL (calc) (ref 1.9–3.7)
Indirect Bilirubin: 0.6 mg/dL (calc) (ref 0.2–1.2)
Total Bilirubin: 0.7 mg/dL (ref 0.2–1.2)
Total Protein: 7.6 g/dL (ref 6.1–8.1)

## 2019-12-27 ENCOUNTER — Ambulatory Visit: Payer: Medicare Other | Attending: Internal Medicine

## 2019-12-27 DIAGNOSIS — Z23 Encounter for immunization: Secondary | ICD-10-CM

## 2019-12-27 NOTE — Progress Notes (Signed)
   Covid-19 Vaccination Clinic  Name:  Dominique Livingston    MRN: [id number] DOB: [birth year]  12/27/2019  [name] was observed post Covid-19 immunization for 15 minutes without incident. She was provided with Vaccine Information Sheet and instruction to access the V-Safe system.   [name] was instructed to call 911 with any severe reactions post vaccine: Marland Kitchen Difficulty breathing  . Swelling of face and throat  . A fast heartbeat  . A bad rash all over body  . Dizziness and weakness   Immunizations Administered    Name Date Dose VIS Date Route   Pfizer COVID-19 Vaccine 12/27/2019 11:12 AM 0.3 mL 09/12/2019 Intramuscular   Manufacturer: [employer], [employer]   Lot: [id number]   NDC: [address]-0

## 2020-01-12 ENCOUNTER — Encounter: Payer: Self-pay | Admitting: Internal Medicine

## 2020-01-12 ENCOUNTER — Other Ambulatory Visit: Payer: Self-pay

## 2020-01-12 ENCOUNTER — Ambulatory Visit (INDEPENDENT_AMBULATORY_CARE_PROVIDER_SITE_OTHER): Payer: Medicare Other | Admitting: Internal Medicine

## 2020-01-12 VITALS — BP 120/80 | HR 77 | Temp 98.3°F | Ht 60.25 in | Wt 129.0 lb

## 2020-01-12 DIAGNOSIS — R350 Frequency of micturition: Secondary | ICD-10-CM

## 2020-01-12 DIAGNOSIS — R8290 Unspecified abnormal findings in urine: Secondary | ICD-10-CM

## 2020-01-12 DIAGNOSIS — N390 Urinary tract infection, site not specified: Secondary | ICD-10-CM | POA: Diagnosis not present

## 2020-01-12 DIAGNOSIS — R300 Dysuria: Secondary | ICD-10-CM | POA: Diagnosis not present

## 2020-01-12 DIAGNOSIS — M19011 Primary osteoarthritis, right shoulder: Secondary | ICD-10-CM

## 2020-01-12 LAB — POCT URINALYSIS DIPSTICK
Appearance: ABNORMAL
Bilirubin, UA: NEGATIVE
Glucose, UA: NEGATIVE
Ketones, UA: NEGATIVE
Nitrite, UA: NEGATIVE
Odor: ABNORMAL
Protein, UA: NEGATIVE
Spec Grav, UA: 1.015 (ref 1.010–1.025)
Urobilinogen, UA: 0.2 E.U./dL
pH, UA: 5 (ref 5.0–8.0)

## 2020-01-12 MED ORDER — NITROFURANTOIN MONOHYD MACRO 100 MG PO CAPS: 100.0000 mg | ORAL_CAPSULE | Freq: Two times a day (BID) | ORAL | 0 refills | Status: DC

## 2020-01-12 NOTE — Progress Notes (Signed)
   Subjective:    [name], female    DOB: 01-Apr-1933, 86 y.o.   MRN: [id number]  HPI 86 year old Female with new onset of urinary frequency first on Thursday April 8 now with dysuria.  Had E. coli UTI August 2020.  Was treated with Macrobid which organism was sensitive to but did not return for follow-up.  Husband was ill at the time and he subsequently expired.  Patient also complained of right shoulder pain which is a new complaint.  Says she previously had issues with right shoulder many years ago with a physical therapy.  Ask her if she could remember some of those exercises and she thinks she can.  Demonstrated range of motion exercises for her today.  She did clean some vines off of a pants over the weekend.  However her shoulder was bothering her prior to that.  Did not fall or injure her shoulder that she is aware off.  Remote history of polymyalgia rheumatica in 2001.    Review of Systems no fever chills nausea or vomiting.  No back pain.  Pain in right upper shoulder area.     Objective:   Physical Exam Vital signs reviewed.  She is afebrile.  Has decreased range of motion in right shoulder.  Difficulty raising right shoulder up over her head.  Some point tenderness in the right biceps area.  Muscle strength in the right upper extremity is within normal limits.  Dipstick UA is abnormal and culture will be sent.       Assessment & Plan:  Right shoulder arthropathy/impingement.  May take Advil or Aleve on a as needed basis.  Apply ice or heat as needed.  Demonstrated range of motion exercises.  If not improving refer to physical therapy.  Acute UTI-prescribed Macrobid 100 mg twice daily for 10 days.  Follow-up in 2 weeks.  Culture sent.  Grief reaction-Continues to grief issues status post loss of husband.  I think she is struggling a bit living alone.  However her family is supportive.  Plan: Follow-up in 2 weeks

## 2020-01-12 NOTE — Patient Instructions (Addendum)
Do range of motion exercise for right shoulder as described in visit today. Take Macrobid 100 mg twice a day for 10 days for urine infection.. Follow up in 2 weeks. Take Advil if needed for shoulder pain. May use  Heat or ice can be used on right shoulder.

## 2020-01-14 LAB — URINE CULTURE
MICRO NUMBER:: 10351745
SPECIMEN QUALITY:: ADEQUATE

## 2020-01-19 ENCOUNTER — Telehealth: Payer: Self-pay

## 2020-01-19 NOTE — Telephone Encounter (Signed)
Patient was notified to stop abx.

## 2020-01-19 NOTE — Telephone Encounter (Signed)
Scheduled

## 2020-01-19 NOTE — Telephone Encounter (Signed)
She needs to come in and be seen today or tomorrow. Stop antibiotic.

## 2020-01-19 NOTE — Telephone Encounter (Signed)
Patient called states that she has a rash on the top of her feet and going up the legs to knees and seems to be spreading by the day. Rash start a couple days after starting Antibiotic for UTI she thought it was a pair of socks that she was wearing that caused the rash.  Rash is red and pen point dots almost up to her knees.    Patient states that she has 5 pills left and wants to know what she should?   Please review and advise

## 2020-01-20 ENCOUNTER — Other Ambulatory Visit: Payer: Self-pay

## 2020-01-20 ENCOUNTER — Ambulatory Visit: Payer: Medicare Other | Attending: Internal Medicine

## 2020-01-20 ENCOUNTER — Ambulatory Visit (INDEPENDENT_AMBULATORY_CARE_PROVIDER_SITE_OTHER): Payer: Medicare Other | Admitting: Internal Medicine

## 2020-01-20 ENCOUNTER — Encounter: Payer: Self-pay | Admitting: Internal Medicine

## 2020-01-20 VITALS — BP 110/60 | HR 76 | Temp 98.7°F | Ht 60.25 in | Wt 133.0 lb

## 2020-01-20 DIAGNOSIS — L270 Generalized skin eruption due to drugs and medicaments taken internally: Secondary | ICD-10-CM | POA: Diagnosis not present

## 2020-01-20 DIAGNOSIS — Z23 Encounter for immunization: Secondary | ICD-10-CM

## 2020-01-20 DIAGNOSIS — N390 Urinary tract infection, site not specified: Secondary | ICD-10-CM

## 2020-01-20 DIAGNOSIS — R8290 Unspecified abnormal findings in urine: Secondary | ICD-10-CM | POA: Diagnosis not present

## 2020-01-20 DIAGNOSIS — B9620 Unspecified Escherichia coli [E. coli] as the cause of diseases classified elsewhere: Secondary | ICD-10-CM

## 2020-01-20 LAB — POCT URINALYSIS DIPSTICK
Appearance: ABNORMAL
Bilirubin, UA: NEGATIVE
Blood, UA: NEGATIVE
Glucose, UA: NEGATIVE
Ketones, UA: NEGATIVE
Nitrite, UA: NEGATIVE
Odor: ABNORMAL
Protein, UA: NEGATIVE
Spec Grav, UA: 1.02 (ref 1.010–1.025)
Urobilinogen, UA: 0.2 E.U./dL
pH, UA: 6 (ref 5.0–8.0)

## 2020-01-20 MED ORDER — CIPROFLOXACIN HCL 250 MG PO TABS: 250.0000 mg | ORAL_TABLET | Freq: Two times a day (BID) | ORAL | 0 refills | Status: DC

## 2020-01-20 NOTE — Progress Notes (Signed)
   Covid-19 Vaccination Clinic  Name:  EDYTH GLOMB    MRN: [id number] DOB: 1932-11-20  01/20/2020  Ms. Welcome was observed post Covid-19 immunization for 15 minutes without incident. She was provided with Vaccine Information Sheet and instruction to access the V-Safe system.   Ms. Guardino was instructed to call 911 with any severe reactions post vaccine: Marland Kitchen Difficulty breathing  . Swelling of face and throat  . A fast heartbeat  . A bad rash all over body  . Dizziness and weakness   Immunizations Administered    Name Date Dose VIS Date Route   Pfizer COVID-19 Vaccine 01/20/2020 10:31 AM 0.3 mL 11/26/2018 Intramuscular   Manufacturer: [employer], [employer]   Lot: [id number]   NDC: [address]-1

## 2020-01-20 NOTE — Progress Notes (Signed)
   Subjective:    [name], female    DOB: [birth year], 86 y.o.   MRN: [id number]  HPI 86  year old Female with rash on legs had onset about 2 days after being started on Macrodantin on April 12 for E.coli UTI. She did not call us until yesterday. She began taking Aleve for musculoskeletal pain at the same time. Urine culture grew E. coli sensitive to nitrofurantoin.  She previously had tolerated this for another UTI in August 2020.  It is not clear to me if she is having a reaction to Macrodantin or Aleve since both were started around the same time.  She developed a rash on her lower legs that looks petechial extending up to her knees but not above her knees.  It is not itchy.  She has had no shortness of breath or wheezing.   Review of Systems see above- she just had COVID-19 vaccine today.  Reluctant to give her steroids for this rash.     Objective:   Physical Exam Vital signs reviewed.  Seen in no acute distress with no respiratory symptoms.  Has petechial rash on both lower extremities from ankles to her knees  Dipstick UA shows persistent UTI.  Culture sent again.     Assessment & Plan:  Suspect drug eruption  Persistent UTI  Plan: Stop Aleve and Macrodantin.  Change to Cipro 250 mg twice daily for 7 days with follow-up in 1 week.

## 2020-01-20 NOTE — Patient Instructions (Signed)
Do not take Aleve or Macrobid.  Change to Cipro 250 mg twice daily for 7 days with follow-up in 1 week.

## 2020-01-21 LAB — URINE CULTURE
MICRO NUMBER:: 10384117
SPECIMEN QUALITY:: ADEQUATE

## 2020-01-22 ENCOUNTER — Telehealth: Payer: Self-pay

## 2020-01-22 ENCOUNTER — Other Ambulatory Visit: Payer: Self-pay

## 2020-01-22 ENCOUNTER — Ambulatory Visit (INDEPENDENT_AMBULATORY_CARE_PROVIDER_SITE_OTHER): Payer: Medicare Other | Admitting: Internal Medicine

## 2020-01-22 ENCOUNTER — Encounter: Payer: Self-pay | Admitting: Internal Medicine

## 2020-01-22 VITALS — BP 100/80 | HR 68 | Temp 98.3°F | Ht 60.25 in | Wt 133.0 lb

## 2020-01-22 DIAGNOSIS — L509 Urticaria, unspecified: Secondary | ICD-10-CM

## 2020-01-22 DIAGNOSIS — L270 Generalized skin eruption due to drugs and medicaments taken internally: Secondary | ICD-10-CM | POA: Diagnosis not present

## 2020-01-22 DIAGNOSIS — B9620 Unspecified Escherichia coli [E. coli] as the cause of diseases classified elsewhere: Secondary | ICD-10-CM | POA: Diagnosis not present

## 2020-01-22 DIAGNOSIS — N390 Urinary tract infection, site not specified: Secondary | ICD-10-CM | POA: Diagnosis not present

## 2020-01-22 MED ORDER — PREDNISONE 10 MG PO TABS: ORAL_TABLET | ORAL | 0 refills | Status: DC

## 2020-01-22 NOTE — Telephone Encounter (Signed)
Scheduled at 12:00pm 

## 2020-01-22 NOTE — Patient Instructions (Signed)
Stop Cipro.  Take short course of Cipro starting with 40 mg and decreasing by 10 mg daily.  Urinary tract infection has resolved.  Not sure if rash is related to Covid vaccine or Cipro.  Suspect it is likely to due to antibiotic.

## 2020-01-22 NOTE — Progress Notes (Signed)
   Subjective:    [name], female    DOB: [birth year], 86 y.o.   MRN: [id number]  HPI Patient had second Covid-19 vaccine on April 20th.  At that time she had a urinary tract infection which had been treated with Macrodantin and she had developed a rash that was petechial in nature on her lower extremities bilaterally.  She has subsequently been changed to Cipro 250 mg twice daily for 7 days as a visit on April 20.  Subsequently developed erythema on face without hives.  No shortness of breath or wheezing.  Rash on legs is slowly improving.  She was not placed on steroids because she had her Covid shot today she was seen on April 20.  She is complaining of itching on face and arms.  There are no frank hives.  She had been taking Aleve in addition to Macrodantin prior to being started on Cipro but stopped Aleve prior to starting Cipro.    Review of Systems see above     Objective:   Physical Exam Vital signs reviewed.  She is afebrile.  Urine dipstick is now clear and showing no signs of urinary tract infection.  She has erythema about her face.  No periorbital swelling.  No oral swelling.  She has no wheezing.  Petechial rash on lower extremities is slowly improving.  She has erythema on her arms.       Assessment & Plan:  It seems that she likely had rash on lower extremity secondary to Macrodantin for urinary tract infection.  This was discontinued and she was started on Cipro.  Not sure if she has rash due to Covid vaccine or to Cipro.  However rash is itchy leading me to think she is intolerant of Cipro.  Rash on lower extremities is improving.  Further complicating of the issue is that she was on Aleve for musculoskeletal pain at the same time she was on Macrodantin for urinary tract infection.  Plan: She will stop Cipro.  Is not to take Aleve.  Given short course of prednisone going from 40 mg to 0 mg over 5 days.  Urinary tract infection has resolved.  Previously  noted allergies included cantaloupe trees and grasses.  Was intolerant of Augmentin but it caused nausea only.

## 2020-01-22 NOTE — Telephone Encounter (Signed)
Patient called states that she has rash on her face, lips are slightly swollen, red swollen wright and hands.  She took Cipro on 01-20-20 took 1 pill,  01-21-20 took 2 pills, 01-22-20 has not taken.  She also had her 2nd Covid 19 shot on 01-20-20 at 1045 am.  She does not have shortness of breath or trouble swallowing at this time.  Please review and advise.  Thank you

## 2020-01-22 NOTE — Telephone Encounter (Signed)
She needs to come in today.

## 2020-01-26 ENCOUNTER — Telehealth: Payer: Self-pay

## 2020-01-26 ENCOUNTER — Ambulatory Visit: Payer: Medicare Other | Admitting: Internal Medicine

## 2020-01-26 NOTE — Telephone Encounter (Signed)
She can cancel if she is much better.

## 2020-01-26 NOTE — Telephone Encounter (Signed)
Patient called states her rash is almost gone she has finished the prednisone, the swelling on her legs has gone away she said she is much better. She wants to know if she still needs to come in tomorrow?

## 2020-01-27 ENCOUNTER — Ambulatory Visit: Payer: Medicare Other | Admitting: Internal Medicine

## 2020-03-23 ENCOUNTER — Other Ambulatory Visit: Payer: Self-pay | Admitting: Internal Medicine

## 2020-04-10 ENCOUNTER — Ambulatory Visit
Admission: EM | Admit: 2020-04-10 | Discharge: 2020-04-10 | Disposition: A | Discharge status: Home / Self Care | Payer: Medicare Other | Attending: Physician Assistant | Admitting: Physician Assistant

## 2020-04-10 ENCOUNTER — Other Ambulatory Visit: Payer: Self-pay

## 2020-04-10 DIAGNOSIS — R21 Rash and other nonspecific skin eruption: Secondary | ICD-10-CM

## 2020-04-10 MED ORDER — PREDNISONE 10 MG PO TABS: ORAL_TABLET | ORAL | 0 refills | Status: AC

## 2020-04-10 NOTE — ED Provider Notes (Signed)
[facility]    CSN: [id number] Arrival date & time: 04/10/20  0946      History   Chief Complaint Chief Complaint  Patient presents with   Rash    HPI Dominique Livingston is a 86 y.o. female.   86 year old female comes in for 2 day history of rash to the body. Had noticed some itching to the neck 1-2 weeks before the full body rash. Now with rash to the trunk, BUE/BLE. No changes to hygiene products, foods, medicines. Denies tick bites, fever, nausea/vomiting, headache. Has not tried anything for the symptoms.  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 This was resolved with prednisone and unsure which caused the symptoms. States this completely resolved prior to current symptom onset.      Past Medical History:  Diagnosis Date   Allergy    Anxiety    Arthritis    hands , stiffness in knees & hips    Colon polyp, hyperplastic    Diverticulosis    Endometriosis    Fibrocystic breast disease    GERD (gastroesophageal reflux disease)    Hyperlipidemia    Hypertension    Hypothyroidism    IBS (irritable bowel syndrome)    Polymyalgia rheumatica (HCC)     Patient Active Problem List   Diagnosis Date Noted   Allergic rhinitis 03/16/2014   History of polymyalgia rheumatica 03/16/2014   HTN (hypertension) 03/08/2012   Hyperlipidemia 03/08/2012   Hypothyroidism 03/08/2012    Past Surgical History:  Procedure Laterality Date   ABDOMINAL HYSTERECTOMY     BREAST BIOPSY Right    CATARACT EXTRACTION, BILATERAL Bilateral    cataracts   CHOLECYSTECTOMY N/A 05/09/2016   Procedure: LAPAROSCOPIC CHOLECYSTECTOMY WITH INTRAOPERATIVE CHOLANGIOGRAM;  Surgeon: Avel Peace, MD;  Location: [facility] OR;  Service: General;  Laterality: N/A;   LEFT COLECTOMY     due to endometriosos     OB History   No obstetric history on file.      Home Medications    Prior to Admission medications     Medication Sig Start Date End Date Taking? Authorizing Provider  acetaminophen (TYLENOL) 500 MG tablet Take 1,000 mg by mouth every 6 (six) hours as needed.    [provider]  ALPRAZolam Prudy Feeler) 0.25 MG tablet Take 1 tablet (0.25 mg total) by mouth 2 (two) times daily as needed for anxiety. 12/19/19   Margaree Mackintosh, MD  Cholecalciferol (VITAMIN D PO) Take 1 tablet by mouth daily.    [provider]  colestipol (COLESTID) 1 g tablet Take 1 tablet (1 g total) by mouth daily. 01/29/18   Napoleon Form, MD  EUTHYROX 75 MCG tablet Take 1 tablet by mouth once daily 03/23/20   Margaree Mackintosh, MD  lisinopril (ZESTRIL) 5 MG tablet Take 1 tablet (5 mg total) by mouth daily. 05/26/19   Margaree Mackintosh, MD  Multiple Vitamin (MULTIVITAMIN) tablet Take 1 tablet by mouth daily.    [provider]  Polyethyl Glycol-Propyl Glycol (SYSTANE ULTRA OP) Place 1 drop into both eyes 2 (two) times daily.    [provider]  predniSONE (DELTASONE) 10 MG tablet Take 4 tablets (40 mg total) by mouth daily with breakfast for 1 day, THEN 3 tablets (30 mg total) daily with breakfast for 1 day, THEN 2 tablets (20 mg total) daily with breakfast for 1 day, THEN 1 tablet (10 mg total) daily with  breakfast for 1 day. 4-3-2-1. 04/10/20 04/14/20  Belinda Fisher, PA-C  Hyoscyamine Sulfate SL (LEVSIN/SL) 0.125 MG SUBL One tab sublingual  one half hour before eating 05/23/18 04/10/20  Margaree Mackintosh, MD    Family History Family History  Problem Relation Age of Onset   Cancer Mother        type unknown   Heart disease Father    COPD Father    Hypertension Daughter    Breast cancer Neg Hx     Social History Social History   Tobacco Use   Smoking status: Never Smoker   Smokeless tobacco: Never Used  Substance Use Topics   Alcohol use: No   Drug use: No     Allergies   Amoxicillin-pot clavulanate, Erythromycin, Mushroom extract complex, and Macrobid [nitrofurantoin monohyd  macro]   Review of Systems Review of Systems  Reason unable to perform ROS: See HPI as above.     Physical Exam Triage Vital Signs ED Triage Vitals  Enc Vitals Group     BP 04/10/20 0958 (!) 151/79     Pulse Rate 04/10/20 0958 87     Resp 04/10/20 0958 18     Temp 04/10/20 0958 98.1 F (36.7 C)     Temp Source 04/10/20 0958 Oral     SpO2 04/10/20 0958 98 %     Weight --      Height --      Head Circumference --      Peak Flow --      Pain Score 04/10/20 1014 5     Pain Loc --      Pain Edu? --      Excl. in GC? --    No data found.  Updated Vital Signs BP (!) 151/79 (BP Location: Left Arm)    Pulse 87    Temp 98.1 F (36.7 C) (Oral)    Resp 18    SpO2 98%   Physical Exam Constitutional:      General: She is not in acute distress.    Appearance: Normal appearance. She is well-developed. She is not toxic-appearing or diaphoretic.  HENT:     Head: Normocephalic and atraumatic.  Eyes:     Conjunctiva/sclera: Conjunctivae normal.     Pupils: Pupils are equal, round, and reactive to light.  Pulmonary:     Effort: Pulmonary effort is normal. No respiratory distress.     Comments: Speaking in full sentences without difficulty Musculoskeletal:     Cervical back: Normal range of motion and neck supple.  Skin:    General: Skin is warm and dry.     Comments: Maculopapular rash to the back, BUE, BLE. No obvious rash to the abdomen, face. No rash to the neck. No erythema, warmth, no tenderness to palpation.   Neurological:     Mental Status: She is alert and oriented to person, place, and time.      UC Treatments / Results  Labs (all labs ordered are listed, but only abnormal results are displayed) Labs Reviewed - No data to display  EKG   Radiology No results found.  Procedures Procedures (including critical care time)  Medications Ordered in UC Medications - No data to display  Initial Impression / Assessment and Plan / UC Course  I have reviewed the  triage vital signs and the nursing notes.  Pertinent labs & imaging results that were available during my care of the patient were reviewed by me and considered in my medical  decision making (see chart for details).     ?histamine response given itching to the neck prior to current symptom onset. No obvious causes of current symptom. Will provide short course of prednisone for symptomatic management. Patient had good relief with low dose 4-3-2-1 few months ago, will repeat same. Continue to monitor for exposures. Return precautions given. Otherwise to recheck with PCP if symptoms not improving. Patient expresses understanding and agrees to plan.  Final Clinical Impressions(s) / UC Diagnoses   Final diagnoses:  Rash and nonspecific skin eruption    ED Prescriptions    Medication Sig Dispense Auth. Provider   predniSONE (DELTASONE) 10 MG tablet Take 4 tablets (40 mg total) by mouth daily with breakfast for 1 day, THEN 3 tablets (30 mg total) daily with breakfast for 1 day, THEN 2 tablets (20 mg total) daily with breakfast for 1 day, THEN 1 tablet (10 mg total) daily with breakfast for 1 day. 4-3-2-1. 10 tablet Belinda Fisher, PA-C     PDMP not reviewed this encounter.   Belinda Fisher, PA-C 04/10/20 1051

## 2020-04-10 NOTE — ED Triage Notes (Signed)
Pt here for evaluation of rash all over body that started x 2 days ago.  She denies change in lotion, soap or detergents, and no new medications.

## 2020-04-10 NOTE — Discharge Instructions (Addendum)
Likely inflammatory/irritation in nature. Continue to monitor new exposures. Start prednisone as directed. Zyrtec 5mg  daily if needed for itching. Follow up with PCP for recheck in 1-2 weeks. If noticing fever, headache, nausea/vomiting, go to the ED for further evaluation needed.

## 2020-05-28 ENCOUNTER — Other Ambulatory Visit: Payer: Self-pay

## 2020-05-28 ENCOUNTER — Ambulatory Visit (INDEPENDENT_AMBULATORY_CARE_PROVIDER_SITE_OTHER): Payer: Medicare Other | Admitting: Internal Medicine

## 2020-05-28 ENCOUNTER — Encounter: Payer: Self-pay | Admitting: Internal Medicine

## 2020-05-28 VITALS — BP 120/70 | HR 62 | Ht 61.25 in | Wt 128.0 lb

## 2020-05-28 DIAGNOSIS — Z8739 Personal history of other diseases of the musculoskeletal system and connective tissue: Secondary | ICD-10-CM

## 2020-05-28 DIAGNOSIS — I6523 Occlusion and stenosis of bilateral carotid arteries: Secondary | ICD-10-CM | POA: Diagnosis not present

## 2020-05-28 DIAGNOSIS — E782 Mixed hyperlipidemia: Secondary | ICD-10-CM | POA: Diagnosis not present

## 2020-05-28 DIAGNOSIS — T63481A Toxic effect of venom of other arthropod, accidental (unintentional), initial encounter: Secondary | ICD-10-CM | POA: Diagnosis not present

## 2020-05-28 DIAGNOSIS — Z0000 Encounter for general adult medical examination without abnormal findings: Secondary | ICD-10-CM

## 2020-05-28 DIAGNOSIS — M8580 Other specified disorders of bone density and structure, unspecified site: Secondary | ICD-10-CM | POA: Diagnosis not present

## 2020-05-28 DIAGNOSIS — E039 Hypothyroidism, unspecified: Secondary | ICD-10-CM | POA: Diagnosis not present

## 2020-05-28 DIAGNOSIS — Z789 Other specified health status: Secondary | ICD-10-CM

## 2020-05-28 DIAGNOSIS — I10 Essential (primary) hypertension: Secondary | ICD-10-CM

## 2020-05-28 DIAGNOSIS — K582 Mixed irritable bowel syndrome: Secondary | ICD-10-CM | POA: Diagnosis not present

## 2020-05-28 DIAGNOSIS — F411 Generalized anxiety disorder: Secondary | ICD-10-CM

## 2020-05-28 DIAGNOSIS — K580 Irritable bowel syndrome with diarrhea: Secondary | ICD-10-CM

## 2020-05-28 LAB — POCT URINALYSIS DIPSTICK
Appearance: NEGATIVE
Bilirubin, UA: NEGATIVE
Blood, UA: NEGATIVE
Glucose, UA: NEGATIVE
Ketones, UA: NEGATIVE
Leukocytes, UA: NEGATIVE
Nitrite, UA: NEGATIVE
Odor: NEGATIVE
Protein, UA: NEGATIVE
Spec Grav, UA: 1.015 (ref 1.010–1.025)
Urobilinogen, UA: 0.2 E.U./dL
pH, UA: 6 (ref 5.0–8.0)

## 2020-05-28 MED ORDER — ALPRAZOLAM 0.25 MG PO TABS: ORAL_TABLET | ORAL | 2 refills | Status: DC

## 2020-05-28 MED ORDER — TRIAMCINOLONE ACETONIDE 0.1 % EX CREA: 1.0000 "application " | TOPICAL_CREAM | Freq: Two times a day (BID) | CUTANEOUS | 0 refills | Status: DC

## 2020-05-29 LAB — CBC WITH DIFFERENTIAL/PLATELET
Absolute Monocytes: 311 cells/uL (ref 200–950)
Basophils Absolute: 79 cells/uL (ref 0–200)
Basophils Relative: 1.3 %
Eosinophils Absolute: 110 cells/uL (ref 15–500)
Eosinophils Relative: 1.8 %
HCT: 39.1 % (ref 35.0–45.0)
Hemoglobin: 12.7 g/dL (ref 11.7–15.5)
Lymphs Abs: 1684 cells/uL (ref 850–3900)
MCH: 29.8 pg (ref 27.0–33.0)
MCHC: 32.5 g/dL (ref 32.0–36.0)
MCV: 91.8 fL (ref 80.0–100.0)
MPV: 9.6 fL (ref 7.5–12.5)
Monocytes Relative: 5.1 %
Neutro Abs: 3916 cells/uL (ref 1500–7800)
Neutrophils Relative %: 64.2 %
Platelets: 215 10*3/uL (ref 140–400)
RBC: 4.26 10*6/uL (ref 3.80–5.10)
RDW: 12.6 % (ref 11.0–15.0)
Total Lymphocyte: 27.6 %
WBC: 6.1 10*3/uL (ref 3.8–10.8)

## 2020-05-29 LAB — COMPLETE METABOLIC PANEL WITH GFR
AG Ratio: 1.6 (calc) (ref 1.0–2.5)
ALT: 17 U/L (ref 6–29)
AST: 21 U/L (ref 10–35)
Albumin: 5.1 g/dL (ref 3.6–5.1)
Alkaline phosphatase (APISO): 65 U/L (ref 37–153)
BUN: 17 mg/dL (ref 7–25)
CO2: 29 mmol/L (ref 20–32)
Calcium: 10 mg/dL (ref 8.6–10.4)
Chloride: 99 mmol/L (ref 98–110)
Creat: 0.85 mg/dL (ref 0.60–0.88)
GFR, Est African American: 72 mL/min/{1.73_m2} (ref >=60)
GFR, Est Non African American: 62 mL/min/{1.73_m2} (ref >=60)
Globulin: 3.1 g/dL (calc) (ref 1.9–3.7)
Glucose, Bld: 94 mg/dL (ref 65–99)
Potassium: 4.4 mmol/L (ref 3.5–5.3)
Sodium: 141 mmol/L (ref 135–146)
Total Bilirubin: 0.6 mg/dL (ref 0.2–1.2)
Total Protein: 8.2 g/dL — ABNORMAL HIGH (ref 6.1–8.1)

## 2020-05-29 LAB — LIPID PANEL
Cholesterol: 307 mg/dL — ABNORMAL HIGH (ref <200)
HDL: 60 mg/dL (ref >=50)
LDL Cholesterol (Calc): 208 mg/dL (calc) — ABNORMAL HIGH
Non-HDL Cholesterol (Calc): 247 mg/dL (calc) — ABNORMAL HIGH (ref <130)
Total CHOL/HDL Ratio: 5.1 (calc) — ABNORMAL HIGH (ref <5.0)
Triglycerides: 201 mg/dL — ABNORMAL HIGH (ref <150)

## 2020-05-29 LAB — TSH: TSH: 4.61 mIU/L — ABNORMAL HIGH (ref 0.40–4.50)

## 2020-05-31 ENCOUNTER — Other Ambulatory Visit: Payer: Self-pay

## 2020-05-31 MED ORDER — EZETIMIBE 10 MG PO TABS: 10.0000 mg | ORAL_TABLET | Freq: Every day | ORAL | 0 refills | Status: DC

## 2020-06-18 ENCOUNTER — Other Ambulatory Visit: Payer: Self-pay | Admitting: Internal Medicine

## 2020-06-25 NOTE — Patient Instructions (Addendum)
Use triamcinolone cream on insect sting 3 times a day.  Take Xanax as directed for anxiety.  Restart Zetia and follow-up in November.  TSH is elevated.  Take thyroid medication on empty stomach and follow-up in November.  Continue medication per Dr. Vladimir Faster a.m. for irritable bowel syndrome

## 2020-06-25 NOTE — Progress Notes (Signed)
Subjective:    [name], female    DOB: 03/06/1933, 86 y.o.   MRN: [id number]  HPI 86 year old Female seen for health maintenance exam and evaluation of medical issues.  Husband passed away in the Fall 2020 due to complications of heart and renal failure.  Patient has history of GE reflux, hypertension, hypothyroidism, osteopenia, allergic rhinitis, hand arthritis, anxiety, insomnia related to anxiety.  History of irritable bowel syndrome.  History of polymyalgia rheumatica diagnosed in 2001 which resolved after treatment and never recurred.  In February 2020 she was diagnosed with influenza A  She is intolerant of Fosamax as she causes an adverse reaction.  Augmentin causes nausea.  She had colonoscopy in 2011 and will not be repeated due to age.  Had cataract extraction right eye February 2006 and left eye April 2006.  History of allergy testing positive for trees, grasses, chocolate, green peas, watermelon and cantaloupe.  Had 1 surgery for endometriosis involving left salpingo-oophorectomy.  During that time there was obstruction of the sigmoid colon which resulted in left colectomy by Dr. Rolene Course in 1969.  Patient subsequently had hysterectomy and right salpingo-oophorectomy performed by Dr. Mickel Crow.  She had an ovarian cyst during the late 1960s.  Had excisional biopsy of the right breast showing fibrocystic breast disease in 1979.  Social history: She is widowed.  She has 2 children, 1 adult son and adult daughter.  She does not smoke or consume alcohol.  Husband was a retired Company secretary.  She resides alone.  Family history: Father died at age 27 with pneumonia/COPD/heart problems.  Mother died at age 19 of cancer that was metastatic from unknown primary.  2 brothers with history of hypertension.  Daughter with history of hypertension and allergies.    Review of Systems no new complaints     Objective:   Physical Exam Vitals reviewed.    Constitutional:      Appearance: Normal appearance.  HENT:     Head: Normocephalic and atraumatic.     Right Ear: Tympanic membrane normal.     Left Ear: Tympanic membrane normal.     Nose: Nose normal.  Eyes:     General: No scleral icterus.    Extraocular Movements: Extraocular movements intact.     Conjunctiva/sclera: Conjunctivae normal.     Pupils: Pupils are equal, round, and reactive to light.  Neck:     Vascular: No carotid bruit.     Comments: No thyromegaly Cardiovascular:     Rate and Rhythm: Normal rate and regular rhythm.     Heart sounds: Normal heart sounds. No murmur heard.   Pulmonary:     Effort: Pulmonary effort is normal.     Breath sounds: Normal breath sounds.     Comments: Breast without masses Abdominal:     General: There is no distension.     Palpations: Abdomen is soft. There is no mass.     Tenderness: There is no abdominal tenderness. There is no rebound.  Musculoskeletal:     Cervical back: Neck supple. No rigidity.     Right lower leg: No edema.     Left lower leg: No edema.  Lymphadenopathy:     Cervical: No cervical adenopathy.  Skin:    General: Skin is warm and dry.  Neurological:     General: No focal deficit present.     Mental Status: She is alert and oriented to person, place, and time.     Cranial Nerves:  No cranial nerve deficit.     Motor: No weakness.     Coordination: Coordination normal.     Gait: Gait normal.  Psychiatric:        Mood and Affect: Mood normal.        Behavior: Behavior normal.        Thought Content: Thought content normal.        Judgment: Judgment normal.           Assessment & Plan:  Anxiety-longstanding-take Xanax 0.25 mg every morning and 2 tablets at night  Hypothyroidism-taking levothyroxine 75 mcg daily.  TSH is 4.61.  Needs to be rechecked when she returns for lipid panel in late November  Allergic rhinitis  Osteopenia  History of insomnia  Insect bite-use triamcinolone cream  topically twice daily until healed  Essential hypertension treated with Zestril 5 mg daily  History of irritable bowel syndrome treated with Colestid by Dr. Lavon Paganini  Insect sting on hand prescribed triamcinolone cream 0.1% 3 times a day.  Hyperlipidemia-longstanding.  Used to take Zetia but became expensive.  Restart Zetia and follow-up in November.  Intolerant of statin medication.  Total cholesterol is 307, triglycerides 201 and LDL cholesterol 208.  Continue to monitor.  Subjective:   Patient presents for Medicare Annual/Subsequent preventive examination.  Review Past Medical/Family/Social: See above   Risk Factors  Current exercise habits: Mostly sedentary does light housework Dietary issues discussed: Low-fat low carbohydrate recommended  Cardiac risk factors: Hyperlipidemia  Depression Screen  (Note: if answer to either of the following is "Yes", a more complete depression screening is indicated)   Over the past two weeks, have you felt down, depressed or hopeless? No  Over the past two weeks, have you felt little interest or pleasure in doing things? No Have you lost interest or pleasure in daily life? No Do you often feel hopeless? No Do you cry easily over simple problems? No   Activities of Daily Living  In your present state of health, do you have any difficulty performing the following activities?:   Driving? No  Managing money? No  Feeding yourself? No  Getting from bed to chair? No  Climbing a flight of stairs? No  Preparing food and eating?: No  Bathing or showering? No  Getting dressed: No  Getting to the toilet? No  Using the toilet:No  Moving around from place to place: No  In the past year have you fallen or had a near fall?:No  Are you sexually active? No  Do you have more than one partner? No   Hearing Difficulties: No  Do you often ask people to speak up or repeat themselves? No  Do you experience ringing or noises in your ears? No  Do you  have difficulty understanding soft or whispered voices? yes Do you feel that you have a problem with memory? No Do you often misplace items? No    Home Safety:  Do you have a smoke alarm at your residence? Yes Do you have grab bars in the bathroom? no Do you have throw rugs in your house? no   Cognitive Testing  Alert? Yes Normal Appearance?Yes  Oriented to person? Yes Place? Yes  Time? Yes  Recall of three objects? Yes  Can perform simple calculations? Yes  Displays appropriate judgment?Yes  Can read the correct time from a watch face?Yes   List the Names of Other Physician/Practitioners you currently use:  See referral list for the physicians patient is currently seeing.  Review of Systems: See above   Objective:     General appearance: Appears stated age and slightly frail Head: Normocephalic, without obvious abnormality, atraumatic  Eyes: conj clear, EOMi PEERLA  Ears: normal TM's and external ear canals both ears  Nose: Nares normal. Septum midline. Mucosa normal. No drainage or sinus tenderness.  Throat: lips, mucosa, and tongue normal; teeth and gums normal  Neck: no adenopathy, no carotid bruit, no JVD, supple, symmetrical, trachea midline and thyroid not enlarged, symmetric, no tenderness/mass/nodules  No CVA tenderness.  Lungs: clear to auscultation bilaterally  Breasts: normal appearance, no masses or tenderness Heart: regular rate and rhythm, S1, S2 normal, no murmur, click, rub or gallop  Abdomen: soft, non-tender; bowel sounds normal; no masses, no organomegaly  Musculoskeletal: ROM normal in all joints, no crepitus, no deformity, Normal muscle strengthen. Back  is symmetric, no curvature. Skin: Skin color, texture, turgor normal. No rashes or lesions  Lymph nodes: Cervical, supraclavicular, and axillary nodes normal.  Neurologic: CN 2 -12 Normal, Normal symmetric reflexes. Normal coordination and gait  Psych: Alert & Oriented x 3, Mood appear stable.      Assessment:    Annual wellness medicare exam   Plan:    During the course of the visit the patient was educated and counseled about appropriate screening and preventive services including:   Has had 2 COVID-19 vaccines and will qualify for booster around December  Tetanus immunization is up-to-date     Patient Instructions (the written plan) was given to the patient.  Medicare Attestation  I have personally reviewed:  The patient's medical and social history  Their use of alcohol, tobacco or illicit drugs  Their current medications and supplements  The patient's functional ability including ADLs,fall risks, home safety risks, cognitive, and hearing and visual impairment  Diet and physical activities  Evidence for depression or mood disorders  The patient's weight, height, BMI, and visual acuity have been recorded in the chart. I have made referrals, counseling, and provided education to the patient based on review of the above and I have provided the patient with a written personalized care plan for preventive services.

## 2020-07-10 ENCOUNTER — Other Ambulatory Visit: Payer: Self-pay | Admitting: Internal Medicine

## 2020-07-19 ENCOUNTER — Telehealth: Payer: Self-pay | Admitting: Internal Medicine

## 2020-07-19 NOTE — Telephone Encounter (Signed)
Scheduled appointment

## 2020-07-19 NOTE — Telephone Encounter (Signed)
Xanax does not cause hives and doubt Zetia would either since she has taken it so long. However, she needs to be seen and get prescription med for hives.

## 2020-07-19 NOTE — Telephone Encounter (Signed)
Dominique Livingston [phone]  [name] called to say she had really bad hives over the weekend, so she stopped taking her Zetia and alprazalam on Sunday, because she thinks one of them was causing it. She said on Saturday she had a itchy mass of red spots around her waste and abdomen and today it is much better. She first started out with a spot on her face and shoulders but not sure when that started.

## 2020-07-20 ENCOUNTER — Encounter: Payer: Self-pay | Admitting: Internal Medicine

## 2020-07-20 ENCOUNTER — Other Ambulatory Visit: Payer: Self-pay

## 2020-07-20 ENCOUNTER — Ambulatory Visit (INDEPENDENT_AMBULATORY_CARE_PROVIDER_SITE_OTHER): Payer: Medicare Other | Admitting: Internal Medicine

## 2020-07-20 VITALS — BP 130/70 | HR 86 | Ht 61.25 in | Wt 128.0 lb

## 2020-07-20 DIAGNOSIS — I10 Essential (primary) hypertension: Secondary | ICD-10-CM | POA: Diagnosis not present

## 2020-07-20 DIAGNOSIS — F411 Generalized anxiety disorder: Secondary | ICD-10-CM

## 2020-07-20 DIAGNOSIS — I6523 Occlusion and stenosis of bilateral carotid arteries: Secondary | ICD-10-CM

## 2020-07-20 DIAGNOSIS — L509 Urticaria, unspecified: Secondary | ICD-10-CM | POA: Diagnosis not present

## 2020-07-20 MED ORDER — PREDNISONE 10 MG PO TABS: ORAL_TABLET | ORAL | 0 refills | Status: DC

## 2020-07-31 NOTE — Patient Instructions (Signed)
Restart your Xanax and take as directed.  Take prednisone 10 mg tablets in tapering course starting with 5 tablets day 1 and decrease by 1 tablet daily i.e. 5-4-3-2-1 taper

## 2020-07-31 NOTE — Progress Notes (Signed)
   Subjective:    [name], female    DOB: August 09, 1933, 86 y.o.   MRN: [id number]  HPI 86 year old Female currently residing alone as her husband passed away in the Fall 2020 called to say she had had an episode of hives over the weekend.  She stopped taking Zetia and alprazolam because she thought one of them was causing hives.  Apparently on Saturday, October 16 she had a massive red spots around her waist and abdomen.  This improved.  She apparently had a memorial service for her husband which she attended in the latter part of last week week.  My feeling is that this likely prompted the outbreak of urticaria.  She has had this before.  I doubt it is medication related and certainly not due to alprazolam.  Says hives are better today.  Review of Systems see above-has no infectious disease symptoms.  No fever.     Objective:   Physical Exam Blood pressure 130/70 pulse 86 pulse oximetry 95% BMI 23.99 weight 128 pounds  I see no evidence of urticaria at this point in time.  However she is anxious and relating the story to me today.  I am concerned that she stopped her Xanax abruptly.       Assessment & Plan:  Urticaria  Anxiety state  History of hypertension  Plan: I am concerned that the hives could recur if she is not treated with a short course of prednisone.  She agrees to this reluctantly.  She will start prednisone 10 mg starting with 5 tablets day 1 and decreasing by 10 mg daily i.e. 5-4-3-2-1 taper.  She will let me know if hives do not completely resolve after that and she is to restart her Xanax.

## 2020-08-20 ENCOUNTER — Ambulatory Visit (INDEPENDENT_AMBULATORY_CARE_PROVIDER_SITE_OTHER): Payer: Medicare Other | Admitting: Internal Medicine

## 2020-08-20 ENCOUNTER — Telehealth: Payer: Self-pay | Admitting: Internal Medicine

## 2020-08-20 ENCOUNTER — Other Ambulatory Visit: Payer: Self-pay

## 2020-08-20 VITALS — BP 128/70 | HR 88 | Temp 97.8°F | Wt 127.0 lb

## 2020-08-20 DIAGNOSIS — L509 Urticaria, unspecified: Secondary | ICD-10-CM | POA: Diagnosis not present

## 2020-08-20 DIAGNOSIS — F411 Generalized anxiety disorder: Secondary | ICD-10-CM | POA: Diagnosis not present

## 2020-08-20 DIAGNOSIS — I6523 Occlusion and stenosis of bilateral carotid arteries: Secondary | ICD-10-CM | POA: Diagnosis not present

## 2020-08-20 LAB — CBC WITH DIFFERENTIAL/PLATELET
Absolute Monocytes: 372 cells/uL (ref 200–950)
Basophils Absolute: 18 cells/uL (ref 0–200)
Basophils Relative: 0.3 %
Eosinophils Absolute: 67 cells/uL (ref 15–500)
Eosinophils Relative: 1.1 %
HCT: 37.8 % (ref 35.0–45.0)
Hemoglobin: 12.5 g/dL (ref 11.7–15.5)
Lymphs Abs: 1635 cells/uL (ref 850–3900)
MCH: 30.4 pg (ref 27.0–33.0)
MCHC: 33.1 g/dL (ref 32.0–36.0)
MCV: 92 fL (ref 80.0–100.0)
MPV: 9.2 fL (ref 7.5–12.5)
Monocytes Relative: 6.1 %
Neutro Abs: 4008 cells/uL (ref 1500–7800)
Neutrophils Relative %: 65.7 %
Platelets: 230 10*3/uL (ref 140–400)
RBC: 4.11 10*6/uL (ref 3.80–5.10)
RDW: 12.5 % (ref 11.0–15.0)
Total Lymphocyte: 26.8 %
WBC: 6.1 10*3/uL (ref 3.8–10.8)

## 2020-08-20 LAB — SEDIMENTATION RATE: Sed Rate: 28 mm/h (ref 0–30)

## 2020-08-20 MED ORDER — PREDNISONE 10 MG PO TABS: ORAL_TABLET | ORAL | 0 refills | Status: DC

## 2020-08-20 MED ORDER — METHYLPREDNISOLONE ACETATE 80 MG/ML IJ SUSP
80.0000 mg | Freq: Once | INTRAMUSCULAR | Status: AC
  Administered 2020-08-20: 80 mg via INTRAMUSCULAR

## 2020-08-20 NOTE — Telephone Encounter (Signed)
I do not think this is due to Zetia. If the rash is present today, we can see her

## 2020-08-20 NOTE — Telephone Encounter (Signed)
Dominique Livingston [phone]  [name] called to say she still has the rash she had when she was here last. It seems to get worse late afternoon, she wanders if this is because she takes her Zetia in the morning. she did say it is now in her throat and on her face. It got a little better when she toke the prednisone.

## 2020-08-20 NOTE — Telephone Encounter (Signed)
Scheduled

## 2020-08-20 NOTE — Progress Notes (Signed)
   Subjective:    [name], female    DOB: [birth year], 86 y.o.   MRN: [id number]  HPI 86 year old Female with history of anxiety seen on October 19 for hives that seem to start around the time she had attended a memorial service for her husband who had passed away in the Fall 2020.  That seemed to prompt the outbreak of urticaria and she was treated with a tapering course of prednisone on October 19.  She is always anxious and does take Xanax twice daily as needed but was encouraged to take it more regularly due to urticaria which I think is triggered in her by anxiety.  Patient called thinking that she recurrent hives was due to Zetia.  Says hives get worse in late afternoon.  Complaining of itching in her throat and facial rash.    Review of Systems see above no respiratory compromise or shortness of breath     Objective:   Physical Exam She is anxious today.  Blood pressure 128/70 pulse 88 weight 127 pounds  Mild facial erythema.  Currently has no hive-like lesions.  Says itching is suddenly gotten better over the past few hours.  Discussion with her about anxiety triggering hives.  Does not appear to be brought on by foods that she has consumed or other factors such as medication.  Reassured her it was not Zetia causing the hives.     Assessment & Plan:  Urticaria  Anxiety state  Plan: Encouraged her to continue Zetia.  Gave her an additional tapering course of prednisone 10 mg starting with 6 tablets day 1 and decreasing every 2 days x 1 tablet over 12 days.  Must take Xanax twice daily for anxiety.  If hives persist we will refer her to Allergist.  20 minutes spent with patient reviewing medications, symptoms, time spent examining patient and time spent explaining histamine release with urticaria which is made worse with anxiety.

## 2020-08-31 ENCOUNTER — Other Ambulatory Visit: Payer: Self-pay | Admitting: Internal Medicine

## 2020-08-31 ENCOUNTER — Other Ambulatory Visit: Payer: Self-pay

## 2020-08-31 ENCOUNTER — Other Ambulatory Visit: Payer: Medicare Other | Admitting: Internal Medicine

## 2020-08-31 DIAGNOSIS — E039 Hypothyroidism, unspecified: Secondary | ICD-10-CM | POA: Diagnosis not present

## 2020-08-31 DIAGNOSIS — E785 Hyperlipidemia, unspecified: Secondary | ICD-10-CM | POA: Diagnosis not present

## 2020-09-01 LAB — TSH: TSH: 1.02 mIU/L (ref 0.40–4.50)

## 2020-09-01 LAB — LIPID PANEL
Cholesterol: 244 mg/dL — ABNORMAL HIGH (ref <200)
HDL: 91 mg/dL (ref >=50)
LDL Cholesterol (Calc): 128 mg/dL (calc) — ABNORMAL HIGH
Non-HDL Cholesterol (Calc): 153 mg/dL (calc) — ABNORMAL HIGH (ref <130)
Total CHOL/HDL Ratio: 2.7 (calc) (ref <5.0)
Triglycerides: 142 mg/dL (ref <150)

## 2020-09-15 ENCOUNTER — Other Ambulatory Visit: Payer: Self-pay | Admitting: Internal Medicine

## 2020-09-30 ENCOUNTER — Encounter: Payer: Self-pay | Admitting: Internal Medicine

## 2020-09-30 NOTE — Patient Instructions (Signed)
Take prednisone in tapering course over 12 days.  Must take Xanax twice daily for anxiety.  If hives persist, we will refer you to allergist.  This is not due to Zetia.

## 2020-12-15 ENCOUNTER — Other Ambulatory Visit: Payer: Self-pay | Admitting: Internal Medicine

## 2021-01-04 ENCOUNTER — Ambulatory Visit: Payer: Medicare Other | Admitting: Internal Medicine

## 2021-01-24 ENCOUNTER — Other Ambulatory Visit: Payer: Self-pay

## 2021-01-24 ENCOUNTER — Encounter: Payer: Self-pay | Admitting: Internal Medicine

## 2021-01-24 ENCOUNTER — Ambulatory Visit (INDEPENDENT_AMBULATORY_CARE_PROVIDER_SITE_OTHER): Payer: Medicare Other | Admitting: Internal Medicine

## 2021-01-24 VITALS — BP 120/60 | HR 73 | Ht 61.25 in | Wt 128.0 lb

## 2021-01-24 DIAGNOSIS — F411 Generalized anxiety disorder: Secondary | ICD-10-CM | POA: Diagnosis not present

## 2021-01-24 DIAGNOSIS — I10 Essential (primary) hypertension: Secondary | ICD-10-CM | POA: Diagnosis not present

## 2021-01-24 DIAGNOSIS — E782 Mixed hyperlipidemia: Secondary | ICD-10-CM | POA: Diagnosis not present

## 2021-01-24 DIAGNOSIS — Z8739 Personal history of other diseases of the musculoskeletal system and connective tissue: Secondary | ICD-10-CM | POA: Diagnosis not present

## 2021-01-24 DIAGNOSIS — Z789 Other specified health status: Secondary | ICD-10-CM

## 2021-01-24 DIAGNOSIS — I6523 Occlusion and stenosis of bilateral carotid arteries: Secondary | ICD-10-CM | POA: Diagnosis not present

## 2021-01-24 DIAGNOSIS — E039 Hypothyroidism, unspecified: Secondary | ICD-10-CM

## 2021-01-24 NOTE — Progress Notes (Signed)
   Subjective:    [name], female    DOB: 1933/06/09, 87 y.o.   MRN: [id number]  HPI 87 year old Female for 6 month recheck. Weight is stable at 128 pounds.  No recent episode of hives.  She does have a history of urticaria.  She has a history of hypertension hyperlipidemia and hypothyroidism.  History of allergic rhinitis and remote history of polymyalgia rheumatica.  She is a widow and resides alone.  Able to take care of her house and yard at the present time.  Has not fallen.  TSH and fasting lipid panel drawn today.  She is statin intolerant and is currently on Zetia 10 mg daily.  Take Xanax up to twice daily for anxiety.  Is on levothyroxine 75 mcg daily for hypothyroidism.  Takes lisinopril 5 mg daily for hypertension.  Takes Zyrtec 10 mg daily for allergic rhinitis.    Review of Systems see above-no new complaints.  Able to ambulate quite well and continues to drive.     Objective:   Physical Exam Blood pressure 120/60 pulse 73 pulse oximetry 97% weight 128 pounds BMI 23.99.  She has gained 1 pound since November 2021.  Skin warm and dry.  No cervical adenopathy.  No carotid bruits.  Chest is clear to auscultation.  Cardiac exam: Regular rate and rhythm normal S1 and S2 without murmurs or gallops.  No lower extremity edema.  Affect thought and judgment appear to be normal.  Skin is warm and dry there is no evidence of urticaria.       Assessment & Plan:  Anxiety disorder treated with Xanax  Essential hypertension stable with low-dose lisinopril  Hyperlipidemia (mixed) treated with Zetia.  Lipid panel is pending.  Liver functions not checked because Zetia does not cause elevated liver functions.  History of urticaria-none recently  Remote history of polymyalgia rheumatica  Plan: Continue current medications.  We will call her with lab test results.  Continue current medications and follow-up with Medicare wellness and health maintenance exam in 6 months.   Recommend having COVID booster due to her age.

## 2021-01-24 NOTE — Patient Instructions (Addendum)
Recommend COVID booster due to age.  Continue current medications and return in 6 months.  Fasting lipid panel and TSH drawn today.  Blood pressure stable on current regimen.

## 2021-01-25 LAB — LIPID PANEL
Cholesterol: 249 mg/dL — ABNORMAL HIGH (ref <200)
HDL: 62 mg/dL (ref >=50)
LDL Cholesterol (Calc): 150 mg/dL (calc) — ABNORMAL HIGH
Non-HDL Cholesterol (Calc): 187 mg/dL (calc) — ABNORMAL HIGH (ref <130)
Total CHOL/HDL Ratio: 4 (calc) (ref <5.0)
Triglycerides: 228 mg/dL — ABNORMAL HIGH (ref <150)

## 2021-01-25 LAB — TSH: TSH: 1.14 mIU/L (ref 0.40–4.50)

## 2021-01-31 ENCOUNTER — Other Ambulatory Visit: Payer: Self-pay | Admitting: Internal Medicine

## 2021-03-18 ENCOUNTER — Other Ambulatory Visit: Payer: Self-pay | Admitting: Internal Medicine

## 2021-05-31 ENCOUNTER — Ambulatory Visit (INDEPENDENT_AMBULATORY_CARE_PROVIDER_SITE_OTHER): Payer: Medicare Other | Admitting: Internal Medicine

## 2021-05-31 ENCOUNTER — Telehealth: Payer: Self-pay | Admitting: Internal Medicine

## 2021-05-31 ENCOUNTER — Encounter: Payer: Self-pay | Admitting: Internal Medicine

## 2021-05-31 ENCOUNTER — Other Ambulatory Visit: Payer: Self-pay

## 2021-05-31 VITALS — BP 160/80 | HR 99 | Temp 100.4°F | Ht 61.25 in | Wt 128.0 lb

## 2021-05-31 DIAGNOSIS — R350 Frequency of micturition: Secondary | ICD-10-CM | POA: Diagnosis not present

## 2021-05-31 DIAGNOSIS — R300 Dysuria: Secondary | ICD-10-CM | POA: Diagnosis not present

## 2021-05-31 DIAGNOSIS — F411 Generalized anxiety disorder: Secondary | ICD-10-CM | POA: Diagnosis not present

## 2021-05-31 DIAGNOSIS — N390 Urinary tract infection, site not specified: Secondary | ICD-10-CM | POA: Diagnosis not present

## 2021-05-31 DIAGNOSIS — K5900 Constipation, unspecified: Secondary | ICD-10-CM | POA: Diagnosis not present

## 2021-05-31 DIAGNOSIS — R8290 Unspecified abnormal findings in urine: Secondary | ICD-10-CM

## 2021-05-31 LAB — POCT URINALYSIS DIPSTICK
Bilirubin, UA: NEGATIVE
Glucose, UA: NEGATIVE
Ketones, UA: NEGATIVE
Nitrite, UA: NEGATIVE
Protein, UA: NEGATIVE
Spec Grav, UA: 1.015 (ref 1.010–1.025)
Urobilinogen, UA: 0.2 E.U./dL
pH, UA: 6 (ref 5.0–8.0)

## 2021-05-31 LAB — CBC WITH DIFFERENTIAL/PLATELET
Absolute Monocytes: 415 cells/uL (ref 200–950)
Basophils Absolute: 61 cells/uL (ref 0–200)
Basophils Relative: 1 %
Eosinophils Absolute: 159 cells/uL (ref 15–500)
Eosinophils Relative: 2.6 %
HCT: 38.4 % (ref 35.0–45.0)
Hemoglobin: 12.3 g/dL (ref 11.7–15.5)
Lymphs Abs: 1915 cells/uL (ref 850–3900)
MCH: 29.9 pg (ref 27.0–33.0)
MCHC: 32 g/dL (ref 32.0–36.0)
MCV: 93.4 fL (ref 80.0–100.0)
MPV: 9.6 fL (ref 7.5–12.5)
Monocytes Relative: 6.8 %
Neutro Abs: 3550 cells/uL (ref 1500–7800)
Neutrophils Relative %: 58.2 %
Platelets: 230 10*3/uL (ref 140–400)
RBC: 4.11 10*6/uL (ref 3.80–5.10)
RDW: 12.2 % (ref 11.0–15.0)
Total Lymphocyte: 31.4 %
WBC: 6.1 10*3/uL (ref 3.8–10.8)

## 2021-05-31 MED ORDER — CIPROFLOXACIN HCL 250 MG PO TABS: 250.0000 mg | ORAL_TABLET | Freq: Two times a day (BID) | ORAL | 0 refills | Status: DC

## 2021-05-31 NOTE — Telephone Encounter (Signed)
Pt called and said she thinks she needs to come in because she is pretty sure she has an UTI, when would you like to see her? Call back [phone]

## 2021-05-31 NOTE — Progress Notes (Signed)
   Subjective:    [name], female    DOB: 1933/07/21, 87 y.o.   MRN: [id number]  HPI 87 year old Female has been constipated  for several days. Has been out of town recently for wedding August 13th in [place], [place]. and issues with constipation started late July or early August. Took Laxative once before trip and then tried stool softeners. Now has UTI symptoms onset about a week ago. Has dysuria and frequency.  Has not had issues with constipation for some time.  May have been anxious about upcoming trip and that may have triggered constipation.  She has a history of anxiety and does take Xanax.  History of hypothyroidism treated with levothyroxine.  She has a history of hypertension and hyperlipidemia.   Review of Systems hx of occasional UTI- last one April 2021 treated with Macrobid     Objective:   Physical Exam BP 160/80 (she is anxious) T 100.4 degrees BMI 23.99 No CVA tenderness Rectal exam not done today. WBC 6100.  Hemoglobin 12.3 g.  Urine dipstick small occult blood, urine is cloudy  and has large LE.  Culture was sent.      Assessment & Plan:   Acute UTI-Cipro 250 mg twice daily for 7 days pending urine culture results.  Constipation-recommend 1 bottle of magnesium citrate over ice today to eliminate constipation.  Call if not getting relief.  Try to eat high-fiber diet Anxiety-treated with Xanax which can cause constipation  Further instructions to follow after reviewing urine culture results. Call if constipation not relieved with these measures.

## 2021-06-01 NOTE — Patient Instructions (Signed)
Take 1 bottle of magnesium citrate over ice today to eliminate constipation.  Take Cipro 250 mg twice daily pending urine culture results for 7 days.  In the future if having issues with constipation over 1 to 2 days, may take Dulcolax tablets or Senokot tablets sooner rather than later to relieve constipation.  Xanax may be aggravating constipation as well as anxiety.  Try to eat high-fiber diet.

## 2021-06-02 ENCOUNTER — Telehealth: Payer: Self-pay | Admitting: Internal Medicine

## 2021-06-02 LAB — URINALYSIS, MICROSCOPIC ONLY
Bacteria, UA: NONE SEEN /HPF
Hyaline Cast: NONE SEEN /LPF
RBC / HPF: NONE SEEN /HPF (ref 0–2)
Squamous Epithelial / HPF: NONE SEEN /HPF (ref <=5)
WBC, UA: >=60 /HPF — AB (ref 0–5)

## 2021-06-02 LAB — URINE CULTURE
MICRO NUMBER:: 12309731
SPECIMEN QUALITY:: ADEQUATE

## 2021-06-02 MED ORDER — AMOXICILLIN 250 MG PO CAPS: 250.0000 mg | ORAL_CAPSULE | Freq: Three times a day (TID) | ORAL | 0 refills | Status: DC

## 2021-06-02 NOTE — Telephone Encounter (Signed)
Has UTI not sensitive to Cipro. Called patient- is some better. Stop Cipro and take Amoxicillin 250 mg 3 times a day for 10 days. Follow up Sept 8 at 10:30 am. Nurse visit only. Organism is Enterococcus faecalis sensitive to Ampicillin. Constipation is better. Time spent reviewing results and speaking with patient plus medical decision making is 10 minutes.

## 2021-06-09 ENCOUNTER — Ambulatory Visit: Payer: Medicare Other | Admitting: Internal Medicine

## 2021-06-09 ENCOUNTER — Other Ambulatory Visit: Payer: Self-pay | Admitting: Internal Medicine

## 2021-06-13 ENCOUNTER — Encounter: Payer: Self-pay | Admitting: Internal Medicine

## 2021-06-13 ENCOUNTER — Other Ambulatory Visit: Payer: Self-pay

## 2021-06-13 ENCOUNTER — Ambulatory Visit (INDEPENDENT_AMBULATORY_CARE_PROVIDER_SITE_OTHER): Payer: Medicare Other | Admitting: Internal Medicine

## 2021-06-13 VITALS — Temp 99.6°F

## 2021-06-13 DIAGNOSIS — B952 Enterococcus as the cause of diseases classified elsewhere: Secondary | ICD-10-CM | POA: Diagnosis not present

## 2021-06-13 DIAGNOSIS — Z87440 Personal history of urinary (tract) infections: Secondary | ICD-10-CM | POA: Diagnosis not present

## 2021-06-13 DIAGNOSIS — N390 Urinary tract infection, site not specified: Secondary | ICD-10-CM

## 2021-06-13 LAB — POCT URINALYSIS DIPSTICK
Bilirubin, UA: NEGATIVE
Blood, UA: NEGATIVE
Glucose, UA: NEGATIVE
Ketones, UA: NEGATIVE
Leukocytes, UA: NEGATIVE
Nitrite, UA: NEGATIVE
Protein, UA: NEGATIVE
Spec Grav, UA: 1.01 (ref 1.010–1.025)
Urobilinogen, UA: 0.2 E.U./dL
pH, UA: 6.5 (ref 5.0–8.0)

## 2021-06-13 NOTE — Progress Notes (Signed)
Patient seen today for test of cure for urinary tract infection.  Patient denies any symptoms today.  In office urinalysis done.

## 2021-06-13 NOTE — Progress Notes (Signed)
[name], female   DOB: 05-02-1933, 87 y.o.   MRN: [id number]    Here for follow up s/p treatment for UTI  on August 30th.  Culture grew Enterococcus faecalis. Patient feeling better. Dipstick urine specimen is normal.

## 2021-06-13 NOTE — Patient Instructions (Signed)
UTI resolved.

## 2021-06-21 ENCOUNTER — Other Ambulatory Visit: Payer: Self-pay

## 2021-06-21 ENCOUNTER — Encounter: Payer: Self-pay | Admitting: Internal Medicine

## 2021-06-21 ENCOUNTER — Ambulatory Visit (INDEPENDENT_AMBULATORY_CARE_PROVIDER_SITE_OTHER): Payer: Medicare Other | Admitting: Internal Medicine

## 2021-06-21 VITALS — BP 122/80 | HR 76 | Resp 18 | Ht 61.0 in | Wt 131.0 lb

## 2021-06-21 DIAGNOSIS — Z789 Other specified health status: Secondary | ICD-10-CM

## 2021-06-21 DIAGNOSIS — Z87440 Personal history of urinary (tract) infections: Secondary | ICD-10-CM

## 2021-06-21 DIAGNOSIS — I10 Essential (primary) hypertension: Secondary | ICD-10-CM | POA: Diagnosis not present

## 2021-06-21 DIAGNOSIS — Z0000 Encounter for general adult medical examination without abnormal findings: Secondary | ICD-10-CM | POA: Diagnosis not present

## 2021-06-21 DIAGNOSIS — F411 Generalized anxiety disorder: Secondary | ICD-10-CM | POA: Diagnosis not present

## 2021-06-21 DIAGNOSIS — Z8739 Personal history of other diseases of the musculoskeletal system and connective tissue: Secondary | ICD-10-CM | POA: Diagnosis not present

## 2021-06-21 DIAGNOSIS — E039 Hypothyroidism, unspecified: Secondary | ICD-10-CM

## 2021-06-21 DIAGNOSIS — E782 Mixed hyperlipidemia: Secondary | ICD-10-CM | POA: Diagnosis not present

## 2021-06-21 LAB — POCT URINALYSIS DIPSTICK
Bilirubin, UA: NEGATIVE
Blood, UA: NEGATIVE
Glucose, UA: NEGATIVE
Ketones, UA: NEGATIVE
Leukocytes, UA: NEGATIVE
Nitrite, UA: NEGATIVE
Protein, UA: NEGATIVE
Spec Grav, UA: 1.01 (ref 1.010–1.025)
Urobilinogen, UA: 0.2 E.U./dL
pH, UA: 5 (ref 5.0–8.0)

## 2021-06-21 LAB — COMPLETE METABOLIC PANEL WITH GFR
AG Ratio: 1.8 (calc) (ref 1.0–2.5)
ALT: 19 U/L (ref 6–29)
AST: 21 U/L (ref 10–35)
Albumin: 4.8 g/dL (ref 3.6–5.1)
Alkaline phosphatase (APISO): 58 U/L (ref 37–153)
BUN: 17 mg/dL (ref 7–25)
CO2: 31 mmol/L (ref 20–32)
Calcium: 9.9 mg/dL (ref 8.6–10.4)
Chloride: 102 mmol/L (ref 98–110)
Creat: 0.77 mg/dL (ref 0.60–0.95)
Globulin: 2.7 g/dL (calc) (ref 1.9–3.7)
Glucose, Bld: 93 mg/dL (ref 65–99)
Potassium: 4.8 mmol/L (ref 3.5–5.3)
Sodium: 142 mmol/L (ref 135–146)
Total Bilirubin: 0.6 mg/dL (ref 0.2–1.2)
Total Protein: 7.5 g/dL (ref 6.1–8.1)
eGFR: 75 mL/min/{1.73_m2} (ref >=60)

## 2021-06-21 LAB — CBC WITH DIFFERENTIAL/PLATELET
Absolute Monocytes: 330 cells/uL (ref 200–950)
Basophils Absolute: 48 cells/uL (ref 0–200)
Basophils Relative: 0.8 %
Eosinophils Absolute: 120 cells/uL (ref 15–500)
Eosinophils Relative: 2 %
HCT: 38.2 % (ref 35.0–45.0)
Hemoglobin: 12.3 g/dL (ref 11.7–15.5)
Lymphs Abs: 1686 cells/uL (ref 850–3900)
MCH: 30.1 pg (ref 27.0–33.0)
MCHC: 32.2 g/dL (ref 32.0–36.0)
MCV: 93.4 fL (ref 80.0–100.0)
MPV: 9.3 fL (ref 7.5–12.5)
Monocytes Relative: 5.5 %
Neutro Abs: 3816 cells/uL (ref 1500–7800)
Neutrophils Relative %: 63.6 %
Platelets: 212 10*3/uL (ref 140–400)
RBC: 4.09 10*6/uL (ref 3.80–5.10)
RDW: 12.2 % (ref 11.0–15.0)
Total Lymphocyte: 28.1 %
WBC: 6 10*3/uL (ref 3.8–10.8)

## 2021-06-21 LAB — LIPID PANEL
Cholesterol: 258 mg/dL — ABNORMAL HIGH (ref <200)
HDL: 63 mg/dL (ref >=50)
LDL Cholesterol (Calc): 159 mg/dL (calc) — ABNORMAL HIGH
Non-HDL Cholesterol (Calc): 195 mg/dL (calc) — ABNORMAL HIGH (ref <130)
Total CHOL/HDL Ratio: 4.1 (calc) (ref <5.0)
Triglycerides: 204 mg/dL — ABNORMAL HIGH (ref <150)

## 2021-06-21 LAB — TSH: TSH: 1.56 mIU/L (ref 0.40–4.50)

## 2021-06-21 NOTE — Progress Notes (Signed)
Subjective:    [name], female    DOB: 11-16-1932, 87 y.o.   MRN: [id number]  HPI 87 year old Female seen for health maintenance exam and evaluation of medical issues. Has recovered from recent UTI. No new complaints. Needs to get Covid booster and flu vaccine in the near future.  She has a history of hyperlipidemia.  Is intolerant of statin medication and is maintained on Zetia 10 mg daily.  Was recently treated for urinary tract infection in August.  Symptoms have resolved.  Culture grew Enterococcus faecalis.  Repeat urine specimen today is normal.  CBC and c-Met are normal.  TSH is normal.  Lipid panel shows total cholesterol of 258 and was 249 in April prior to restarting Zetia.  Triglycerides have decreased from 228 in April to 204.  HDL is 63.  LDL was 150 in April and has decreased to 159.  I think she probably should watch her diet a bit more and we will continue with Zetia as she has not been statin intolerant.  I would prefer that she take Zetia.  She has a history of anxiety.  She resides alone.  Has prescription for Xanax to take twice daily as needed.  She has hypothyroidism and takes levothyroxine 75 mcg daily.  She is on Zestril 5 mg daily.  Takes Zyrtec 10 mg daily for allergic rhinitis.  History of polymyalgia rheumatica diagnosed in 2001 which resolved after treatment and never recurred.  History of irritable bowel syndrome.  History of GE reflux, hypertension, osteopenia, allergic rhinitis, hand arthritis, insomnia related to anxiety.  Her husband passed away in the fall 3086 due to complications of heart and renal failure.  She is intolerant of Fosamax as it causes an adverse reaction.  Augmentin causes nausea.  She had colonoscopy in 2011 and this will not be repeated due to her age.  She has had allergy testing in the past positive for trees, grasses, chocolate, green peas, watermelon and cantaloupe.  Had cataract extraction of the right in  February 2006 and of the left eye in April 2006.  She had surgery for endometriosis involving a left salpingectomy with oophorectomy and during that time there was obstruction of the sigmoid colon which resulted in left colectomy by Dr. Vida Rigger in 1969.  Patient subsequently had hysterectomy and right salpingo-oophorectomy performed by Dr. Lolita Cram.  She had an ovarian cyst during the late 1960s removed and had excisional biopsy of the right breast showing fibrocystic breast disease in 1979.  Social history: She is a widow who resides alone.  Husband was a retired Agricultural consultant.  She has 2 children, adult sons and 1 adult daughter.  Family history: Father died at age 62 with pneumonia/COPD/heart problems.  Mother died at age 24 of cancer that was metastatic from unknown primary.  2 brothers with history of hypertension.  Daughter with history of hypertension and allergies.  Records indicates she has had 2 COVID vaccines and booster is recommended for the fall.  Review of Systems see above     Objective:   Physical Exam Blood pressure 122/80 pulse 76 respiratory rate 18 pulse oximetry 98% weight 131 pounds height 5 feet 1 inches BMI 24.75  Skin: Warm and dry.  Nodes: None.  TMs clear.  Neck is supple without JVD thyromegaly or carotid bruits.  Chest is clear to auscultation.  Cardiac exam: Regular rate and rhythm.  Breast are without masses.  Abdomen is soft nondistended without hepatosplenomegaly masses or  tenderness.  No lower extremity pitting edema.  Neuro is intact.  No focal deficits on brief neurological exam.       Assessment & Plan:  Anxiety state-takes Xanax as needed  Recent UTI-resolved with treatment  Hyperlipidemia-not a lot of change with lipid panel after restarting Zetia.  Needs to watch diet and get some exercise.  Continue Zetia 10 mg daily  History of polymyalgia rheumatica in the remote past  Hypothyroidism stable on thyroid replacement  Takes Zestril 5 mg daily  for mild hypertension  Take Zyrtec as needed for allergy symptoms  Plan: Return in 12 months or as needed.  Subjective:   Patient presents for Medicare Annual/Subsequent preventive examination.  Review Past Medical/Family/Social: See above   Risk Factors  Current exercise habits: Light Dietary issues discussed: Yes  Cardiac risk factors: Hyperlipidemia  Depression Screen  (Note: if answer to either of the following is "Yes", a more complete depression screening is indicated)   Over the past two weeks, have you felt down, depressed or hopeless? No  Over the past two weeks, have you felt little interest or pleasure in doing things? No Have you lost interest or pleasure in daily life? No Do you often feel hopeless? No Do you cry easily over simple problems? No   Activities of Daily Living  In your present state of health, do you have any difficulty performing the following activities?:   Driving? No  Managing money? No  Feeding yourself? No  Getting from bed to chair? No  Climbing a flight of stairs? No  Preparing food and eating?: No  Bathing or showering? No  Getting dressed: No  Getting to the toilet? No  Using the toilet:No  Moving around from place to place: No  In the past year have you fallen or had a near fall?:No  Are you sexually active? No  Do you have more than one partner? No   Hearing Difficulties: No  Do you often ask people to speak up or repeat themselves? No  Do you experience ringing or noises in your ears? No  Do you have difficulty understanding soft or whispered voices? No  Do you feel that you have a problem with memory? No Do you often misplace items? No    Home Safety:  Do you have a smoke alarm at your residence? Yes Do you have grab bars in the bathroom?  Yes Do you have throw rugs in your house?  Noted   Cognitive Testing  Alert? Yes Normal Appearance?Yes  Oriented to person? Yes Place? Yes  Time? Yes  Recall of three objects?  Yes  Can perform simple calculations? Yes  Displays appropriate judgment?Yes  Can read the correct time from a watch face?Yes   List the Names of Other Physician/Practitioners you currently use:  See referral list for the physicians patient is currently seeing.  Has seen Dr. Silverio Decamp   Review of Systems: See above   Objective:     General appearance: Appears stated age and a bit frail Head: Normocephalic, without obvious abnormality, atraumatic  Eyes: conj clear, EOMi PEERLA  Ears: normal TM's and external ear canals both ears  Nose: Nares normal. Septum midline. Mucosa normal. No drainage or sinus tenderness.  Throat: lips, mucosa, and tongue normal; teeth and gums normal  Neck: no adenopathy, no carotid bruit, no JVD, supple, symmetrical, trachea midline and thyroid not enlarged, symmetric, no tenderness/mass/nodules  No CVA tenderness.  Lungs: clear to auscultation bilaterally  Breasts: normal appearance, no  masses or tenderness Heart: regular rate and rhythm, S1, S2 normal, no murmur, click, rub or gallop  Abdomen: soft, non-tender; bowel sounds normal; no masses, no organomegaly  Musculoskeletal: ROM normal in all joints, no crepitus, no deformity, Normal muscle strengthen. Back  is symmetric, no curvature. Skin: Skin color, texture, turgor normal. No rashes or lesions  Lymph nodes: Cervical, supraclavicular, and axillary nodes normal.  Neurologic: CN 2 -12 Normal, Normal symmetric reflexes. Normal coordination and gait  Psych: Alert & Oriented x 3, Mood appear stable.    Assessment:    Annual wellness medicare exam   Plan:    During the course of the visit the patient was educated and counseled about appropriate screening and preventive services including:   Suggest COVID booster in the Fall     Patient Instructions (the written plan) was given to the patient.  Medicare Attestation  I have personally reviewed:  The patient's medical and social history  Their  use of alcohol, tobacco or illicit drugs  Their current medications and supplements  The patient's functional ability including ADLs,fall risks, home safety risks, cognitive, and hearing and visual impairment  Diet and physical activities  Evidence for depression or mood disorders  The patient's weight, height, BMI, and visual acuity have been recorded in the chart. I have made referrals, counseling, and provided education to the patient based on review of the above and I have provided the patient with a written personalized care plan for preventive services.

## 2021-06-21 NOTE — Patient Instructions (Addendum)
To get Covid booster and flu vaccine in the near future. RTC in one year or as needed.  It was a pleasure to see you today.  Labs drawn and pending.  We will notify you of results particularly lipid panel.

## 2021-09-02 DIAGNOSIS — Z23 Encounter for immunization: Secondary | ICD-10-CM | POA: Diagnosis not present

## 2021-09-07 ENCOUNTER — Other Ambulatory Visit: Payer: Self-pay | Admitting: Internal Medicine

## 2021-12-06 ENCOUNTER — Encounter: Payer: Self-pay | Admitting: Internal Medicine

## 2021-12-06 ENCOUNTER — Other Ambulatory Visit: Payer: Self-pay | Admitting: Internal Medicine

## 2021-12-06 ENCOUNTER — Telehealth: Payer: Self-pay | Admitting: Internal Medicine

## 2021-12-06 MED ORDER — ALPRAZOLAM 0.25 MG PO TABS: 0.2500 mg | ORAL_TABLET | Freq: Two times a day (BID) | ORAL | 0 refills | Status: DC | PRN

## 2021-12-06 NOTE — Telephone Encounter (Signed)
Refilling Rx for Xanax once. Patient has not taken this in several months. ?

## 2021-12-06 NOTE — Telephone Encounter (Signed)
Dominique Livingston ?[phone] ? ?Dominique Livingston called to say she is having more anxiety than she should and is not sleeping at night sine first of the year so she would like to get a refill on below medication. I let her know for this medication she may need office visit. ? ?ALPRAZolam (XANAX) 0.25 MG tablet ? ?[facility] [address] [place]), [place] - [address] Phone:  [phone]  ?Fax:  [phone]  ?  ? ?

## 2021-12-06 NOTE — Telephone Encounter (Signed)
Called and let patient know

## 2022-03-14 ENCOUNTER — Other Ambulatory Visit: Payer: Self-pay | Admitting: Internal Medicine

## 2022-04-08 ENCOUNTER — Inpatient Hospital Stay (HOSPITAL_COMMUNITY)
Admission: EM | Admit: 2022-04-08 | Discharge: 2022-04-14 | DRG: 392 | Disposition: A | Discharge status: Home / Self Care | Payer: Medicare Other | Attending: General Practice | Admitting: General Practice

## 2022-04-08 ENCOUNTER — Other Ambulatory Visit: Payer: Self-pay

## 2022-04-08 ENCOUNTER — Emergency Department (HOSPITAL_COMMUNITY): Payer: Medicare Other

## 2022-04-08 ENCOUNTER — Encounter (HOSPITAL_COMMUNITY): Payer: Self-pay | Admitting: Emergency Medicine

## 2022-04-08 DIAGNOSIS — K580 Irritable bowel syndrome with diarrhea: Principal | ICD-10-CM | POA: Diagnosis present

## 2022-04-08 DIAGNOSIS — D649 Anemia, unspecified: Secondary | ICD-10-CM | POA: Diagnosis present

## 2022-04-08 DIAGNOSIS — Z90710 Acquired absence of both cervix and uterus: Secondary | ICD-10-CM | POA: Diagnosis not present

## 2022-04-08 DIAGNOSIS — I10 Essential (primary) hypertension: Secondary | ICD-10-CM | POA: Diagnosis present

## 2022-04-08 DIAGNOSIS — K219 Gastro-esophageal reflux disease without esophagitis: Secondary | ICD-10-CM | POA: Diagnosis present

## 2022-04-08 DIAGNOSIS — Z79899 Other long term (current) drug therapy: Secondary | ICD-10-CM | POA: Diagnosis not present

## 2022-04-08 DIAGNOSIS — E785 Hyperlipidemia, unspecified: Secondary | ICD-10-CM | POA: Diagnosis present

## 2022-04-08 DIAGNOSIS — D72829 Elevated white blood cell count, unspecified: Secondary | ICD-10-CM | POA: Diagnosis not present

## 2022-04-08 DIAGNOSIS — Z91018 Allergy to other foods: Secondary | ICD-10-CM

## 2022-04-08 DIAGNOSIS — N289 Disorder of kidney and ureter, unspecified: Secondary | ICD-10-CM | POA: Diagnosis not present

## 2022-04-08 DIAGNOSIS — N281 Cyst of kidney, acquired: Secondary | ICD-10-CM | POA: Diagnosis not present

## 2022-04-08 DIAGNOSIS — Z9049 Acquired absence of other specified parts of digestive tract: Secondary | ICD-10-CM

## 2022-04-08 DIAGNOSIS — Z8719 Personal history of other diseases of the digestive system: Secondary | ICD-10-CM

## 2022-04-08 DIAGNOSIS — Z8249 Family history of ischemic heart disease and other diseases of the circulatory system: Secondary | ICD-10-CM

## 2022-04-08 DIAGNOSIS — R1111 Vomiting without nausea: Secondary | ICD-10-CM | POA: Diagnosis not present

## 2022-04-08 DIAGNOSIS — Z9841 Cataract extraction status, right eye: Secondary | ICD-10-CM

## 2022-04-08 DIAGNOSIS — R933 Abnormal findings on diagnostic imaging of other parts of digestive tract: Secondary | ICD-10-CM

## 2022-04-08 DIAGNOSIS — R110 Nausea: Secondary | ICD-10-CM | POA: Diagnosis not present

## 2022-04-08 DIAGNOSIS — K529 Noninfective gastroenteritis and colitis, unspecified: Principal | ICD-10-CM | POA: Diagnosis present

## 2022-04-08 DIAGNOSIS — R490 Dysphonia: Secondary | ICD-10-CM | POA: Diagnosis not present

## 2022-04-08 DIAGNOSIS — N179 Acute kidney failure, unspecified: Secondary | ICD-10-CM | POA: Insufficient documentation

## 2022-04-08 DIAGNOSIS — Z888 Allergy status to other drugs, medicaments and biological substances status: Secondary | ICD-10-CM

## 2022-04-08 DIAGNOSIS — K6389 Other specified diseases of intestine: Secondary | ICD-10-CM | POA: Diagnosis not present

## 2022-04-08 DIAGNOSIS — Z881 Allergy status to other antibiotic agents status: Secondary | ICD-10-CM | POA: Diagnosis not present

## 2022-04-08 DIAGNOSIS — R112 Nausea with vomiting, unspecified: Secondary | ICD-10-CM | POA: Diagnosis not present

## 2022-04-08 DIAGNOSIS — R32 Unspecified urinary incontinence: Secondary | ICD-10-CM | POA: Diagnosis present

## 2022-04-08 DIAGNOSIS — E782 Mixed hyperlipidemia: Secondary | ICD-10-CM

## 2022-04-08 DIAGNOSIS — K56600 Partial intestinal obstruction, unspecified as to cause: Secondary | ICD-10-CM | POA: Diagnosis present

## 2022-04-08 DIAGNOSIS — R1084 Generalized abdominal pain: Secondary | ICD-10-CM

## 2022-04-08 DIAGNOSIS — Z809 Family history of malignant neoplasm, unspecified: Secondary | ICD-10-CM | POA: Diagnosis not present

## 2022-04-08 DIAGNOSIS — Z825 Family history of asthma and other chronic lower respiratory diseases: Secondary | ICD-10-CM

## 2022-04-08 DIAGNOSIS — Z79890 Hormone replacement therapy (postmenopausal): Secondary | ICD-10-CM | POA: Diagnosis not present

## 2022-04-08 DIAGNOSIS — Z9842 Cataract extraction status, left eye: Secondary | ICD-10-CM

## 2022-04-08 DIAGNOSIS — Z90721 Acquired absence of ovaries, unilateral: Secondary | ICD-10-CM | POA: Diagnosis not present

## 2022-04-08 DIAGNOSIS — F419 Anxiety disorder, unspecified: Secondary | ICD-10-CM | POA: Diagnosis present

## 2022-04-08 DIAGNOSIS — E039 Hypothyroidism, unspecified: Secondary | ICD-10-CM | POA: Diagnosis present

## 2022-04-08 DIAGNOSIS — E876 Hypokalemia: Secondary | ICD-10-CM | POA: Diagnosis not present

## 2022-04-08 DIAGNOSIS — R197 Diarrhea, unspecified: Secondary | ICD-10-CM | POA: Diagnosis not present

## 2022-04-08 LAB — CBC
HCT: 40.8 % (ref 36.0–46.0)
Hemoglobin: 13 g/dL (ref 12.0–15.0)
MCH: 30 pg (ref 26.0–34.0)
MCHC: 31.9 g/dL (ref 30.0–36.0)
MCV: 94.2 fL (ref 80.0–100.0)
Platelets: 231 10*3/uL (ref 150–400)
RBC: 4.33 MIL/uL (ref 3.87–5.11)
RDW: 13.1 % (ref 11.5–15.5)
WBC: 12.2 10*3/uL — ABNORMAL HIGH (ref 4.0–10.5)
nRBC: 0 % (ref 0.0–0.2)

## 2022-04-08 LAB — COMPREHENSIVE METABOLIC PANEL
ALT: 23 U/L (ref 0–44)
AST: 25 U/L (ref 15–41)
Albumin: 4.5 g/dL (ref 3.5–5.0)
Alkaline Phosphatase: 56 U/L (ref 38–126)
Anion gap: 14 (ref 5–15)
BUN: 22 mg/dL (ref 8–23)
CO2: 26 mmol/L (ref 22–32)
Calcium: 10.6 mg/dL — ABNORMAL HIGH (ref 8.9–10.3)
Chloride: 100 mmol/L (ref 98–111)
Creatinine, Ser: 1.01 mg/dL — ABNORMAL HIGH (ref 0.44–1.00)
GFR, Estimated: 54 mL/min — ABNORMAL LOW (ref >60)
Glucose, Bld: 176 mg/dL — ABNORMAL HIGH (ref 70–99)
Potassium: 4.3 mmol/L (ref 3.5–5.1)
Sodium: 140 mmol/L (ref 135–145)
Total Bilirubin: 0.7 mg/dL (ref 0.3–1.2)
Total Protein: 8 g/dL (ref 6.5–8.1)

## 2022-04-08 LAB — URINALYSIS, ROUTINE W REFLEX MICROSCOPIC
Bilirubin Urine: NEGATIVE
Glucose, UA: NEGATIVE mg/dL
Hgb urine dipstick: NEGATIVE
Ketones, ur: NEGATIVE mg/dL
Nitrite: NEGATIVE
Protein, ur: NEGATIVE mg/dL
Specific Gravity, Urine: 1.018 (ref 1.005–1.030)
pH: 5 (ref 5.0–8.0)

## 2022-04-08 LAB — C-REACTIVE PROTEIN: CRP: 0.6 mg/dL (ref <1.0)

## 2022-04-08 LAB — SEDIMENTATION RATE: Sed Rate: 20 mm/hr (ref 0–22)

## 2022-04-08 LAB — LIPASE, BLOOD: Lipase: 29 U/L (ref 11–51)

## 2022-04-08 LAB — TSH: TSH: 1.035 u[IU]/mL (ref 0.350–4.500)

## 2022-04-08 MED ORDER — ACETAMINOPHEN 650 MG RE SUPP: 650.0000 mg | Freq: Four times a day (QID) | RECTAL | Status: DC | PRN

## 2022-04-08 MED ORDER — ACETAMINOPHEN 325 MG PO TABS: 650.0000 mg | ORAL_TABLET | Freq: Four times a day (QID) | ORAL | Status: DC | PRN

## 2022-04-08 MED ORDER — ONDANSETRON HCL 4 MG PO TABS: 4.0000 mg | ORAL_TABLET | Freq: Four times a day (QID) | ORAL | Status: DC | PRN

## 2022-04-08 MED ORDER — ALBUTEROL SULFATE (2.5 MG/3ML) 0.083% IN NEBU: 2.5000 mg | INHALATION_SOLUTION | Freq: Four times a day (QID) | RESPIRATORY_TRACT | Status: DC | PRN

## 2022-04-08 MED ORDER — ONDANSETRON HCL 4 MG/2ML IJ SOLN
4.0000 mg | Freq: Four times a day (QID) | INTRAMUSCULAR | Status: DC | PRN
  Administered 2022-04-10 – 2022-04-14 (×2): 4 mg via INTRAVENOUS
  Filled 2022-04-08 (×2): qty 2

## 2022-04-08 MED ORDER — ONDANSETRON HCL 4 MG/2ML IJ SOLN
4.0000 mg | Freq: Once | INTRAMUSCULAR | Status: DC
  Filled 2022-04-08: qty 2

## 2022-04-08 MED ORDER — SODIUM CHLORIDE 0.9% FLUSH
3.0000 mL | Freq: Two times a day (BID) | INTRAVENOUS | Status: DC
  Administered 2022-04-08 – 2022-04-14 (×10): 3 mL via INTRAVENOUS

## 2022-04-08 MED ORDER — SODIUM CHLORIDE 0.9 % IV BOLUS
1000.0000 mL | Freq: Once | INTRAVENOUS | Status: AC
  Administered 2022-04-08: 1000 mL via INTRAVENOUS

## 2022-04-08 MED ORDER — IOHEXOL 300 MG/ML  SOLN
100.0000 mL | Freq: Once | INTRAMUSCULAR | Status: AC | PRN
  Administered 2022-04-08: 100 mL via INTRAVENOUS

## 2022-04-08 MED ORDER — MORPHINE SULFATE (PF) 2 MG/ML IV SOLN
2.0000 mg | INTRAVENOUS | Status: DC | PRN
  Administered 2022-04-09: 2 mg via INTRAVENOUS
  Filled 2022-04-08: qty 1

## 2022-04-08 MED ORDER — LEVOTHYROXINE SODIUM 75 MCG PO TABS
75.0000 ug | ORAL_TABLET | Freq: Every day | ORAL | Status: DC
  Administered 2022-04-08 – 2022-04-14 (×7): 75 ug via ORAL
  Filled 2022-04-08 (×7): qty 1

## 2022-04-08 MED ORDER — ENOXAPARIN SODIUM 40 MG/0.4ML IJ SOSY
40.0000 mg | PREFILLED_SYRINGE | INTRAMUSCULAR | Status: DC
  Administered 2022-04-08 – 2022-04-13 (×6): 40 mg via SUBCUTANEOUS
  Filled 2022-04-08 (×6): qty 0.4

## 2022-04-08 MED ORDER — MORPHINE SULFATE (PF) 4 MG/ML IV SOLN
4.0000 mg | Freq: Once | INTRAVENOUS | Status: AC
  Administered 2022-04-08: 4 mg via INTRAVENOUS
  Filled 2022-04-08: qty 1

## 2022-04-08 MED ORDER — METRONIDAZOLE 500 MG/100ML IV SOLN
500.0000 mg | Freq: Two times a day (BID) | INTRAVENOUS | Status: DC
  Administered 2022-04-08 – 2022-04-14 (×12): 500 mg via INTRAVENOUS
  Filled 2022-04-08 (×12): qty 100

## 2022-04-08 MED ORDER — METRONIDAZOLE 500 MG/100ML IV SOLN
500.0000 mg | Freq: Once | INTRAVENOUS | Status: AC
  Administered 2022-04-08: 500 mg via INTRAVENOUS
  Filled 2022-04-08: qty 100

## 2022-04-08 MED ORDER — SODIUM CHLORIDE 0.9 % IV SOLN
2.0000 g | INTRAVENOUS | Status: DC
  Administered 2022-04-09 – 2022-04-14 (×6): 2 g via INTRAVENOUS
  Filled 2022-04-08 (×6): qty 20

## 2022-04-08 MED ORDER — ALPRAZOLAM 0.25 MG PO TABS
0.2500 mg | ORAL_TABLET | Freq: Two times a day (BID) | ORAL | Status: DC | PRN
  Administered 2022-04-08 – 2022-04-11 (×4): 0.25 mg via ORAL
  Filled 2022-04-08 (×4): qty 1

## 2022-04-08 MED ORDER — SODIUM CHLORIDE 0.9 % IV SOLN: INTRAVENOUS | Status: DC

## 2022-04-08 MED ORDER — ONDANSETRON HCL 4 MG/2ML IJ SOLN
4.0000 mg | Freq: Once | INTRAMUSCULAR | Status: AC
  Administered 2022-04-08: 4 mg via INTRAVENOUS
  Filled 2022-04-08: qty 2

## 2022-04-08 MED ORDER — SODIUM CHLORIDE 0.9 % IV SOLN
2.0000 g | Freq: Once | INTRAVENOUS | Status: AC
  Administered 2022-04-08: 2 g via INTRAVENOUS
  Filled 2022-04-08: qty 20

## 2022-04-08 NOTE — ED Notes (Signed)
Attending physician at bedside

## 2022-04-08 NOTE — ED Triage Notes (Signed)
Pt BIB [facility] from home, c/o nausea/vomiting and abdominal pain that started around 1630 today. Denies diarrhea, urinary symptoms.

## 2022-04-08 NOTE — Consult Note (Signed)
Consultation  Referring Provider:  Dr. Madelyn Flavors    Primary Care Physician:  Margaree Mackintosh, MD Primary Gastroenterologist: Dr. Lavon Paganini        Reason for Consultation:    Abnormal CT abdomen          HPI:   Dominique Livingston is a 88 y.o. female with past medical history of hypertension, hyperlipidemia, previous cholecystectomy in 2017, hypothyroidism, diverticulosis, colon polyps and reflux, who presented to the ER today with a complaint of generalized abdominal pain.    Today, patient explains that for a while she has just not felt "quite right", with some discharge from her left eye and some bleeding gums and some hoarseness after talking for about 10 minutes.  She also noticed a change in her bowel habits towards constipation producing only small amounts of a softer stool about 3-4 times a day which were hard to produce, which was very abnormal for her.  Then yesterday she had lunch around 1 PM and 3 hours later acutely started with a generalized sharp constant and severe 10/10 pain followed by nausea and vomiting.  Sitting down seemed to worsen the pain so she found herself pacing the house.  She eventually proceeded to the ER as the symptoms got no better.    Currently her abdominal pain is much better and her nausea and vomiting is currently controlled after recent medications.    Denies fever, chills, weight loss or blood in her stool.  ED course: WBC 12.2, BUN 22, urinalysis with trace leukocytes and rare bacteria, CT abdomen and pelvis with signs concerning for acute segmental colitis of the ascending colon, scattered short segments of small bowel luminal narrowing concerning for inflammatory enteritis and mildly increased caliber of the distal middle and small bowel loops which low-grade bowel obstruction cannot be excluded  GI history: 01/29/2018 office visit with Dr. Lavon Paganini at that time discussed abdominal pain, nausea and fatigue incontinence; plan: GI pathogen panel, started  Colestid 1 g twice daily-stool studies were negative 01/17/2018 CT of the abdomen pelvis showed fluid throughout the majority of nondistended small and large bowel raising the possibility of underlying enteritis, no bowel wall thickening or evidence of bowel wall inflammation, subtle hazy edema in the lower abdominal mesentery 01/18/2010 colonoscopy with 3 polyps in the sigmoid colon and mild diverticulosis in the sigmoid colon otherwise normal  Past Medical History:  Diagnosis Date   Allergy    Anxiety    Arthritis    hands , stiffness in knees & hips    Colon polyp, hyperplastic    Diverticulosis    Endometriosis    Fibrocystic breast disease    GERD (gastroesophageal reflux disease)    Hyperlipidemia    Hypertension    Hypothyroidism    IBS (irritable bowel syndrome)    Polymyalgia rheumatica (HCC)     Past Surgical History:  Procedure Laterality Date   ABDOMINAL HYSTERECTOMY     BREAST BIOPSY Right    CATARACT EXTRACTION, BILATERAL Bilateral    cataracts   CHOLECYSTECTOMY N/A 05/09/2016   Procedure: LAPAROSCOPIC CHOLECYSTECTOMY WITH INTRAOPERATIVE CHOLANGIOGRAM;  Surgeon: Avel Peace, MD;  Location: [facility] OR;  Service: General;  Laterality: N/A;   LEFT COLECTOMY     due to endometriosos     Family History  Problem Relation Age of Onset   Cancer Mother        type unknown   Heart disease Father    COPD Father    Hypertension Daughter  Breast cancer Neg Hx      Social History   Tobacco Use   Smoking status: Never   Smokeless tobacco: Never  Substance Use Topics   Alcohol use: No   Drug use: No    Prior to Admission medications   Medication Sig Start Date End Date Taking? Authorizing Provider  acetaminophen (TYLENOL) 500 MG tablet Take 1,000 mg by mouth every 6 (six) hours as needed for moderate pain.   Yes [provider]  ALPRAZolam (XANAX) 0.25 MG tablet Take 1 tablet (0.25 mg total) by mouth 2 (two) times daily as needed. for anxiety Patient  taking differently: Take 0.25 mg by mouth 2 (two) times daily as needed for anxiety. 12/06/21  Yes Baxley, Cresenciano Lick, MD  cetirizine (ZYRTEC) 10 MG tablet Chew 10 mg by mouth daily.   Yes [provider]  ezetimibe (ZETIA) 10 MG tablet Take 1 tablet by mouth once daily 03/14/22  Yes Baxley, Cresenciano Lick, MD  levothyroxine (SYNTHROID) 75 MCG tablet Take 1 tablet by mouth once daily 03/14/22  Yes Baxley, Cresenciano Lick, MD  lisinopril (ZESTRIL) 5 MG tablet Take 1 tablet by mouth once daily 03/14/22  Yes Baxley, Cresenciano Lick, MD  Multiple Vitamin (MULTIVITAMIN) tablet Take 1 tablet by mouth daily.   Yes [provider]  Polyethyl Glycol-Propyl Glycol (SYSTANE ULTRA OP) Place 1 drop into both eyes 2 (two) times daily.   Yes [provider]  colestipol (COLESTID) 1 g tablet Take 1 tablet (1 g total) by mouth daily. Patient not taking: Reported on 04/08/2022 01/29/18   Mauri Pole, MD  Hyoscyamine Sulfate SL (LEVSIN/SL) 0.125 MG SUBL One tab sublingual  one half hour before eating 05/23/18 04/10/20  Elby Showers, MD    Current Facility-Administered Medications  Medication Dose Route Frequency Provider Last Rate Last Admin   0.9 %  sodium chloride infusion   Intravenous Continuous Tamala Julian, Rondell A, MD       acetaminophen (TYLENOL) tablet 650 mg  650 mg Oral Q6H PRN Norval Morton, MD       Or   acetaminophen (TYLENOL) suppository 650 mg  650 mg Rectal Q6H PRN Fuller Plan A, MD       albuterol (PROVENTIL) (2.5 MG/3ML) 0.083% nebulizer solution 2.5 mg  2.5 mg Nebulization Q6H PRN Norval Morton, MD       ALPRAZolam Duanne Moron) tablet 0.25 mg  0.25 mg Oral BID PRN Norval Morton, MD       [START ON 04/09/2022] cefTRIAXone (ROCEPHIN) 2 g in sodium chloride 0.9 % 100 mL IVPB  2 g Intravenous Q24H Smith, Rondell A, MD       And   metroNIDAZOLE (FLAGYL) IVPB 500 mg  500 mg Intravenous Q12H Smith, Rondell A, MD       enoxaparin (LOVENOX) injection 40 mg  40 mg Subcutaneous Q24H Smith, Rondell A, MD        levothyroxine (SYNTHROID) tablet 75 mcg  75 mcg Oral Q0600 Fuller Plan A, MD   75 mcg at 04/08/22 0846   morphine (PF) 2 MG/ML injection 2 mg  2 mg Intravenous Q3H PRN Fuller Plan A, MD       ondansetron (ZOFRAN) injection 4 mg  4 mg Intravenous Once Tamala Julian, Rondell A, MD       ondansetron (ZOFRAN) tablet 4 mg  4 mg Oral Q6H PRN Fuller Plan A, MD       Or   ondansetron (ZOFRAN) injection 4 mg  4 mg  Intravenous Q6H PRN Fuller Plan A, MD       sodium chloride flush (NS) 0.9 % injection 3 mL  3 mL Intravenous Q12H Smith, Rondell A, MD       Current Outpatient Medications  Medication Sig Dispense Refill   acetaminophen (TYLENOL) 500 MG tablet Take 1,000 mg by mouth every 6 (six) hours as needed for moderate pain.     ALPRAZolam (XANAX) 0.25 MG tablet Take 1 tablet (0.25 mg total) by mouth 2 (two) times daily as needed. for anxiety (Patient taking differently: Take 0.25 mg by mouth 2 (two) times daily as needed for anxiety.) 60 tablet 0   cetirizine (ZYRTEC) 10 MG tablet Chew 10 mg by mouth daily.     ezetimibe (ZETIA) 10 MG tablet Take 1 tablet by mouth once daily 90 tablet 0   levothyroxine (SYNTHROID) 75 MCG tablet Take 1 tablet by mouth once daily 90 tablet 0   lisinopril (ZESTRIL) 5 MG tablet Take 1 tablet by mouth once daily 90 tablet 0   Multiple Vitamin (MULTIVITAMIN) tablet Take 1 tablet by mouth daily.     Polyethyl Glycol-Propyl Glycol (SYSTANE ULTRA OP) Place 1 drop into both eyes 2 (two) times daily.     colestipol (COLESTID) 1 g tablet Take 1 tablet (1 g total) by mouth daily. (Patient not taking: Reported on 04/08/2022) 30 tablet 3    Allergies as of 04/08/2022 - Review Complete 04/08/2022  Allergen Reaction Noted   Amoxicillin-pot clavulanate Nausea Only 02/02/2011   Erythromycin Nausea And Vomiting 02/02/2011   Mushroom extract complex Other (See Comments) 05/03/2016   Macrobid [nitrofurantoin monohyd macro] Rash 01/20/2020     Review of Systems:     Constitutional: No weight loss, fever or chills Skin: No rash  Cardiovascular: No chest pain Respiratory: No SOB  Gastrointestinal: See HPI and otherwise negative Genitourinary: No dysuria  Neurological: No headache, dizziness or syncope Musculoskeletal: No new muscle or joint pain Hematologic: No bleeding  Psychiatric: No history of depression or anxiety    Physical Exam:  Vital signs in last 24 hours: Temp:  [97.8 F (36.6 C)] 97.8 F (36.6 C) (07/08 0324) Pulse Rate:  [62-79] 68 (07/08 1315) Resp:  [12-18] 15 (07/08 1315) BP: (121-155)/(50-78) 141/78 (07/08 1315) SpO2:  [95 %-100 %] 95 % (07/08 1315)   General:   Pleasant, frail appearing, Elderly Caucasian female appears to be in NAD, Well developed, Well nourished, alert and cooperative Head:  Normocephalic and atraumatic. Eyes:   PEERL, EOMI. No icterus. Conjunctiva pink. Ears:  Normal auditory acuity. Neck:  Supple Throat: Oral cavity and pharynx without inflammation, swelling or lesion. Teeth in good condition. Lungs: Respirations even and unlabored. Lungs clear to auscultation bilaterally.   No wheezes, crackles, or rhonchi.  Heart: Normal S1, S2. No MRG. Regular rate and rhythm. No peripheral edema, cyanosis or pallor.  Abdomen:  Soft, nondistended, nontender. No rebound or guarding. Normal bowel sounds. No appreciable masses or hepatomegaly. Rectal:  Not performed.  Msk:  Symmetrical without gross deformities. Peripheral pulses intact.  Extremities:  Without edema, no deformity or joint abnormality.  Neurologic:  Alert and  oriented x4;  grossly normal neurologically.  Skin:   Dry and intact without significant lesions or rashes. Psychiatric: Demonstrates good judgement and reason without abnormal affect or behaviors.   LAB RESULTS: Recent Labs    04/08/22 0322  WBC 12.2*  HGB 13.0  HCT 40.8  PLT 231   BMET Recent Labs    04/08/22 0322  NA 140  K 4.3  CL 100  CO2 26  GLUCOSE 176*  BUN 22   CREATININE 1.01*  CALCIUM 10.6*   LFT Recent Labs    04/08/22 0322  PROT 8.0  ALBUMIN 4.5  AST 25  ALT 23  ALKPHOS 56  BILITOT 0.7   STUDIES: CT ABDOMEN PELVIS W CONTRAST  Result Date: 04/08/2022 CLINICAL DATA:  Abdominal pain.  Nausea and vomiting EXAM: CT ABDOMEN AND PELVIS WITH CONTRAST TECHNIQUE: Multidetector CT imaging of the abdomen and pelvis was performed using the standard protocol following bolus administration of intravenous contrast. RADIATION DOSE REDUCTION: This exam was performed according to the departmental dose-optimization program which includes automated exposure control, adjustment of the mA and/or kV according to patient size and/or use of iterative reconstruction technique. CONTRAST:  113mL OMNIPAQUE IOHEXOL 300 MG/ML  SOLN COMPARISON:  01/17/2018 FINDINGS: Lower chest: No acute abnormality.4 mm nodule identified within the left lower lobe, image 3/5. Unchanged from comparison exam Hepatobiliary: No suspicious liver lesion. Status post cholecystectomy. Chronic increase caliber of the common bile duct measures up to 1.1 cm. No intrahepatic bile duct dilatation identified. Pancreas: Unremarkable. No pancreatic ductal dilatation or surrounding inflammatory changes. Spleen: Normal in size without focal abnormality. Adrenals/Urinary Tract: Normal adrenal glands. Simple appearing kidney cysts identified. No follow-up imaging recommended. No nephrolithiasis or hydronephrosis identified bilaterally. Urinary bladder is unremarkable. Stomach/Bowel: The stomach appears normal. Proximal small bowel loops are unremarkable. The mid and distal small bowel loops are mildly increased in caliber measuring up to 2.7 cm. No signs of high-grade bowel obstruction. There are a few scattered short segment areas of small bowel luminal narrowing, mural enhancement and mild mural thickening. Signs previous right partial right colectomy with enterocolonic anastomosis identified. The mildly dilated  small bowel loops extend up to the anastomosis. There is mild wall thickening and enhancement involving the ascending colon just distal to the anastomosis concerning for acute colitis with possible underlying inflammatory stricture formation. The remaining portions of the colon appear unremarkable. No signs of bowel perforation. Vascular/Lymphatic: Aortic atherosclerosis. No abdominopelvic adenopathy. There is edema/venous congestion identified within the lower abdominal small bowel mesentery, image 58/3. Reproductive: Status post hysterectomy. No adnexal masses. Other: No significant free fluid. No fluid collections. No signs of pneumoperitoneum Musculoskeletal: Spondylosis identified within the lumbar spine. IMPRESSION: 1. Signs of previous right partial colectomy with enterocolonic anastomosis. There is mild wall thickening and enhancement involving the ascending colon just distal to the anastomosis concerning for acute segment colitis with possible underlying inflammatory stricture formation. Correlate for any clinical signs/symptoms of inflammatory bowel disease such as Crohn's disease. 2. There are a scattered short segments of small bowel luminal narrowing, mural enhancement and mild mural thickening. Findings are concerning for inflammatory enteritis. 3. Mildly increased caliber of the mid and distal small bowel loops are identified. Low-grade partial obstruction cannot be excluded. 4. There is edema/venous congestion within the lower abdominal small bowel mesentery. This is a nonspecific finding but can be seen in the setting of inflammatory bowel disease. 5. 4 mm nodule identified within the left lower lobe. Unchanged from previous exam compatible with a benign abnormality. 6. Aortic Atherosclerosis (ICD10-I70.0). Electronically Signed   By: Kerby Moors M.D.   On: 04/08/2022 06:25     Impression / Plan:   Impression: 1.  Abnormal CT of the abdomen: With question of acute segment colitis and  possible IBD as well as inflammatory enteritis with low-grade partial small bowel obstruction, last colonoscopy in 2011 with no sign  of IBD; likely infectious versus inflammatory 2.  Abdominal pain: With above, some better at time of exam 3.  Constipation: For the past couple of weeks felt constipated, but worse over the past 2 days with no bowel movement at all; with partial obstruction as above 4.  Leukocytosis: WBC elevated at 12.2, likely with colitis as above  Plan: 1.  Hospitalist team has already started antibiotics, recommend continuing this for now. 2.  No emergent plans for colonoscopy, though this may need to be considered pending her hospital course. 3.  For now okay with clear liquids as tolerated 4.  Will order abdominal x-ray for the morning to reevaluate obstruction 5.  Antiemetics and analgesics as needed 6.  Continue observation   Thank you for your kind consultation, we will continue to follow.  Violet Baldy Maisee Vollman  04/08/2022, 2:28 PM

## 2022-04-08 NOTE — ED Provider Notes (Signed)
[facility] EMERGENCY DEPARTMENT Provider Note   CSN: [id number] Arrival date & time: 04/08/22  0301     History  Chief Complaint  Patient presents with   Abdominal Pain    Dominique Livingston is a 88 y.o. female.  88 yo F with a chief complaints of abdominal discomfort nausea and vomiting.  She has had some more frequent and hard stools over the past week but this morning developed nausea and vomiting.  Pain diffusely about the abdomen seems to come and go.  Just feels sore to her.  No fevers.  This occurred after she ate lunch and so she thought maybe it something that she ate.  Her family members with her and had the same meal and has had no symptoms.     Abdominal Pain      Home Medications Prior to Admission medications   Medication Sig Start Date End Date Taking? Authorizing Provider  acetaminophen (TYLENOL) 500 MG tablet Take 1,000 mg by mouth every 6 (six) hours as needed.    [provider]  ALPRAZolam Prudy Feeler) 0.25 MG tablet Take 1 tablet (0.25 mg total) by mouth 2 (two) times daily as needed. for anxiety 12/06/21   Margaree Mackintosh, MD  cetirizine (ZYRTEC) 10 MG chewable tablet Chew 10 mg by mouth daily.    [provider]  colestipol (COLESTID) 1 g tablet Take 1 tablet (1 g total) by mouth daily. 01/29/18   Napoleon Form, MD  ezetimibe (ZETIA) 10 MG tablet Take 1 tablet by mouth once daily 03/14/22   Margaree Mackintosh, MD  levothyroxine (SYNTHROID) 75 MCG tablet Take 1 tablet by mouth once daily 03/14/22   Margaree Mackintosh, MD  lisinopril (ZESTRIL) 5 MG tablet Take 1 tablet by mouth once daily 03/14/22   Margaree Mackintosh, MD  Multiple Vitamin (MULTIVITAMIN) tablet Take 1 tablet by mouth daily.    [provider]  Polyethyl Glycol-Propyl Glycol (SYSTANE ULTRA OP) Place 1 drop into both eyes 2 (two) times daily.    [provider]  triamcinolone cream (KENALOG) 0.1 % Apply 1 application topically 2 (two) times daily. Patient not  taking: Reported on 06/21/2021 05/28/20   Margaree Mackintosh, MD  Hyoscyamine Sulfate SL (LEVSIN/SL) 0.125 MG SUBL One tab sublingual  one half hour before eating 05/23/18 04/10/20  Margaree Mackintosh, MD      Allergies    Amoxicillin-pot clavulanate, Erythromycin, Mushroom extract complex, and Macrobid [nitrofurantoin monohyd macro]    Review of Systems   Review of Systems  Gastrointestinal:  Positive for abdominal pain.    Physical Exam Updated Vital Signs BP (!) 155/63   Pulse 79   Temp 97.8 F (36.6 C) (Oral)   Resp 18   SpO2 100%  Physical Exam Vitals and nursing note reviewed.  Constitutional:      General: She is not in acute distress.    Appearance: She is well-developed. She is not diaphoretic.  HENT:     Head: Normocephalic and atraumatic.  Eyes:     Pupils: Pupils are equal, round, and reactive to light.  Cardiovascular:     Rate and Rhythm: Normal rate and regular rhythm.     Heart sounds: No murmur heard.    No friction rub. No gallop.  Pulmonary:     Effort: Pulmonary effort is normal.     Breath sounds: No wheezing or rales.  Abdominal:     General: There is no distension.  Palpations: Abdomen is soft.     Tenderness: There is abdominal tenderness.     Comments: Mild diffuse abdominal tenderness without focality  Musculoskeletal:        General: No tenderness.     Cervical back: Normal range of motion and neck supple.  Skin:    General: Skin is warm and dry.  Neurological:     Mental Status: She is alert and oriented to person, place, and time.  Psychiatric:        Behavior: Behavior normal.     ED Results / Procedures / Treatments   Labs (all labs ordered are listed, but only abnormal results are displayed) Labs Reviewed  COMPREHENSIVE METABOLIC PANEL - Abnormal; Notable for the following components:      Result Value   Glucose, Bld 176 (*)    Creatinine, Ser 1.01 (*)    Calcium 10.6 (*)    GFR, Estimated 54 (*)    All other components within  normal limits  CBC - Abnormal; Notable for the following components:   WBC 12.2 (*)    All other components within normal limits  URINALYSIS, ROUTINE W REFLEX MICROSCOPIC - Abnormal; Notable for the following components:   Leukocytes,Ua TRACE (*)    Bacteria, UA RARE (*)    All other components within normal limits  LIPASE, BLOOD    EKG EKG Interpretation  Date/Time:  Saturday April 08 2022 03:30:00 EDT Ventricular Rate:  74 PR Interval:  158 QRS Duration: 76 QT Interval:  406 QTC Calculation: 450 R Axis:   75 Text Interpretation: Normal sinus rhythm Normal ECG No significant change since last tracing Confirmed by Deno Etienne [phone]) on 04/08/2022 4:17:18 AM  Radiology CT ABDOMEN PELVIS W CONTRAST  Result Date: 04/08/2022 CLINICAL DATA:  Abdominal pain.  Nausea and vomiting EXAM: CT ABDOMEN AND PELVIS WITH CONTRAST TECHNIQUE: Multidetector CT imaging of the abdomen and pelvis was performed using the standard protocol following bolus administration of intravenous contrast. RADIATION DOSE REDUCTION: This exam was performed according to the departmental dose-optimization program which includes automated exposure control, adjustment of the mA and/or kV according to patient size and/or use of iterative reconstruction technique. CONTRAST:  126mL OMNIPAQUE IOHEXOL 300 MG/ML  SOLN COMPARISON:  01/17/2018 FINDINGS: Lower chest: No acute abnormality.4 mm nodule identified within the left lower lobe, image 3/5. Unchanged from comparison exam Hepatobiliary: No suspicious liver lesion. Status post cholecystectomy. Chronic increase caliber of the common bile duct measures up to 1.1 cm. No intrahepatic bile duct dilatation identified. Pancreas: Unremarkable. No pancreatic ductal dilatation or surrounding inflammatory changes. Spleen: Normal in size without focal abnormality. Adrenals/Urinary Tract: Normal adrenal glands. Simple appearing kidney cysts identified. No follow-up imaging recommended. No  nephrolithiasis or hydronephrosis identified bilaterally. Urinary bladder is unremarkable. Stomach/Bowel: The stomach appears normal. Proximal small bowel loops are unremarkable. The mid and distal small bowel loops are mildly increased in caliber measuring up to 2.7 cm. No signs of high-grade bowel obstruction. There are a few scattered short segment areas of small bowel luminal narrowing, mural enhancement and mild mural thickening. Signs previous right partial right colectomy with enterocolonic anastomosis identified. The mildly dilated small bowel loops extend up to the anastomosis. There is mild wall thickening and enhancement involving the ascending colon just distal to the anastomosis concerning for acute colitis with possible underlying inflammatory stricture formation. The remaining portions of the colon appear unremarkable. No signs of bowel perforation. Vascular/Lymphatic: Aortic atherosclerosis. No abdominopelvic adenopathy. There is edema/venous congestion identified within the lower  abdominal small bowel mesentery, image 58/3. Reproductive: Status post hysterectomy. No adnexal masses. Other: No significant free fluid. No fluid collections. No signs of pneumoperitoneum Musculoskeletal: Spondylosis identified within the lumbar spine. IMPRESSION: 1. Signs of previous right partial colectomy with enterocolonic anastomosis. There is mild wall thickening and enhancement involving the ascending colon just distal to the anastomosis concerning for acute segment colitis with possible underlying inflammatory stricture formation. Correlate for any clinical signs/symptoms of inflammatory bowel disease such as Crohn's disease. 2. There are a scattered short segments of small bowel luminal narrowing, mural enhancement and mild mural thickening. Findings are concerning for inflammatory enteritis. 3. Mildly increased caliber of the mid and distal small bowel loops are identified. Low-grade partial obstruction cannot be  excluded. 4. There is edema/venous congestion within the lower abdominal small bowel mesentery. This is a nonspecific finding but can be seen in the setting of inflammatory bowel disease. 5. 4 mm nodule identified within the left lower lobe. Unchanged from previous exam compatible with a benign abnormality. 6. Aortic Atherosclerosis (ICD10-I70.0). Electronically Signed   By: Signa Kell M.D.   On: 04/08/2022 06:25    Procedures Procedures    Medications Ordered in ED Medications  ondansetron (ZOFRAN) injection 4 mg (has no administration in time range)  cefTRIAXone (ROCEPHIN) 2 g in sodium chloride 0.9 % 100 mL IVPB (has no administration in time range)    And  metroNIDAZOLE (FLAGYL) IVPB 500 mg (has no administration in time range)  morphine (PF) 4 MG/ML injection 4 mg (4 mg Intravenous Given 04/08/22 0449)  ondansetron (ZOFRAN) injection 4 mg (4 mg Intravenous Given 04/08/22 0449)  sodium chloride 0.9 % bolus 1,000 mL (1,000 mLs Intravenous New Bag/Given 04/08/22 0452)  iohexol (OMNIPAQUE) 300 MG/ML solution 100 mL (100 mLs Intravenous Contrast Given 04/08/22 0535)  morphine (PF) 4 MG/ML injection 4 mg (4 mg Intravenous Given 04/08/22 0557)    ED Course/ Medical Decision Making/ A&P                           Medical Decision Making Amount and/or Complexity of Data Reviewed Labs: ordered. Radiology: ordered.  Risk Prescription drug management.   88 yo F with a chief complaint of abdominal pain.  Diffusely about the abdomen with some nausea and vomiting.  Started after lunch.  She thought maybe it was something that she ate.  Otherwise denies suspicious food intake denies recent travel.  Her stool has looked a little bit more dark to her recently but does look that way since she started new medication about a month or so ago.  Mild diffuse abdominal discomfort.  Mild leukocytosis, LFTs and lipase are unremarkable UA independently turbid by me negative for infection.  We will give pain and  nausea medicine CT scan of the abdomen pelvis with contrast.  Reassess.  CT scan of the abdomen pelvis concerning for colitis.  Will start on antibiotics.  There was a line in the radiology read where they could not rule out a low-grade partial obstruction.  Still quite uncomfortable on reassessment.  Will discuss with medicine for admission.  The patients results and plan were reviewed and discussed.   Any x-rays performed were independently reviewed by myself.   Differential diagnosis were considered with the presenting HPI.  Medications  ondansetron (ZOFRAN) injection 4 mg (has no administration in time range)  cefTRIAXone (ROCEPHIN) 2 g in sodium chloride 0.9 % 100 mL IVPB (has no administration in time range)  And  metroNIDAZOLE (FLAGYL) IVPB 500 mg (has no administration in time range)  morphine (PF) 4 MG/ML injection 4 mg (4 mg Intravenous Given 04/08/22 0449)  ondansetron (ZOFRAN) injection 4 mg (4 mg Intravenous Given 04/08/22 0449)  sodium chloride 0.9 % bolus 1,000 mL (1,000 mLs Intravenous New Bag/Given 04/08/22 0452)  iohexol (OMNIPAQUE) 300 MG/ML solution 100 mL (100 mLs Intravenous Contrast Given 04/08/22 0535)  morphine (PF) 4 MG/ML injection 4 mg (4 mg Intravenous Given 04/08/22 0557)    Vitals:   04/08/22 0324 04/08/22 0500 04/08/22 0538 04/08/22 0600  BP: (!) 141/50 (!) 136/52 (!) 150/64 (!) 155/63  Pulse: 74 67 77 79  Resp: 18     Temp: 97.8 F (36.6 C)     TempSrc: Oral     SpO2: 98% 98% 95% 100%    Final diagnoses:  Colitis    Admission/ observation were discussed with the admitting physician, patient and/or family and they are comfortable with the plan.            Final Clinical Impression(s) / ED Diagnoses Final diagnoses:  Colitis    Rx / DC Orders ED Discharge Orders     None         Deno Etienne, DO 04/08/22 [phone]

## 2022-04-08 NOTE — H&P (Addendum)
History and Physical    Patient: Dominique Livingston DOB: 1932/12/13 DOA: 04/08/2022 DOS: the patient was seen and examined on 04/08/2022 PCP: Elby Showers, MD  Patient coming from: Home via EMS  Chief Complaint:  Chief Complaint  Patient presents with   Abdominal Pain   HPI: Dominique Livingston is a 88 y.o. female with medical history significant of hypertension, hyperlipidemia, hypothyroidism, diverticulosis, endometriosis s/p right hemicolectomy over 50 years ago, colon polyps, and GERD who presents with complaints of generalized abdominal pain.  Symptoms started suddenly around 4 PM yesterday afternoon.  Pain was sharp, constant, and reported as severe.  Patient developed nausea and vomiting.  She thought symptoms were related to food poisoning initially.  She had last eaten around 1 PM with her daughter for which she had the same food, but her daughter did not have any symptoms.  Emesis was noted to be red in color, but denied it appearing like blood.  Patient remembers that she also eaten a tomato sandwich sometime thereafter which appeared to be same color as the emesis.  Associated symptoms included diaphoresis with vomiting and intermittent hoarse voice.  Denied any recent fever, shortness of breath, weight loss, cough, or dysuria symptoms.  For what she can recall her last bowel movement was approximately 2 days ago, but is unsure of when she last passed gas.  For the last few months she has had 3-4 small bowel movements per day that are soft and not normal in size.  Denies having any significant weight loss.  Patient's last colonoscopy was back in 2011 where she had 3 polyps removed pathology noted polyps were hyperplastic.  It appeared that she was recommended to follow-up in 5 to 7 years, but patient reports that she received a letter in the mail that stated in 10-15 years.   Upon admission into the emergency department patient was seen to be afebrile with blood pressures  136/52-155/63, and all other vital signs maintained.  Labs significant for WBC 12.2, BUN 22, creatinine 1.01, glucose 176, and calcium 10.6.  Urinalysis noted trace leukocytes with rare bacteria, but no other significant findings to suggest infection.  CT scan of the abdomen pelvis noted signs concerning for acute segment colitis of the ascending colon distal to enterocolonic anastomosis previous right partial hemicolectomy, scattered short segments of small bowel luminal narrowing concerning for inflammatory enteritis, mildly increased caliber of the distal mid and small bowel loops for which low-grade bowel obstruction could not be excluded.  Patient has been given morphine, antiemetics, 1 L normal saline IV fluids, Rocephin, and metronidazole.  Review of Systems: As mentioned in the history of present illness. All other systems reviewed and are negative. Past Medical History:  Diagnosis Date   Allergy    Anxiety    Arthritis    hands , stiffness in knees & hips    Colon polyp, hyperplastic    Diverticulosis    Endometriosis    Fibrocystic breast disease    GERD (gastroesophageal reflux disease)    Hyperlipidemia    Hypertension    Hypothyroidism    IBS (irritable bowel syndrome)    Polymyalgia rheumatica (HCC)    Past Surgical History:  Procedure Laterality Date   ABDOMINAL HYSTERECTOMY     BREAST BIOPSY Right    CATARACT EXTRACTION, BILATERAL Bilateral    cataracts   CHOLECYSTECTOMY N/A 05/09/2016   Procedure: LAPAROSCOPIC CHOLECYSTECTOMY WITH INTRAOPERATIVE CHOLANGIOGRAM;  Surgeon: Jackolyn Confer, MD;  Location: [facility];  Service: General;  Laterality: N/A;   LEFT COLECTOMY     due to endometriosos    Social History:  reports that she has never smoked. She has never used smokeless tobacco. She reports that she does not drink alcohol and does not use drugs.  Allergies  Allergen Reactions   Amoxicillin-Pot Clavulanate Nausea Only   Erythromycin Nausea And Vomiting   Mushroom  Extract Complex     Cantalope, adding enviromental allergens such as greass & trees   Macrobid [Nitrofurantoin Monohyd Macro] Rash    Family History  Problem Relation Age of Onset   Cancer Mother        type unknown   Heart disease Father    COPD Father    Hypertension Daughter    Breast cancer Neg Hx     Prior to Admission medications   Medication Sig Start Date End Date Taking? Authorizing Provider  acetaminophen (TYLENOL) 500 MG tablet Take 1,000 mg by mouth every 6 (six) hours as needed.    [provider]  ALPRAZolam Duanne Moron) 0.25 MG tablet Take 1 tablet (0.25 mg total) by mouth 2 (two) times daily as needed. for anxiety 12/06/21   Elby Showers, MD  cetirizine (ZYRTEC) 10 MG chewable tablet Chew 10 mg by mouth daily.    [provider]  colestipol (COLESTID) 1 g tablet Take 1 tablet (1 g total) by mouth daily. 01/29/18   Mauri Pole, MD  ezetimibe (ZETIA) 10 MG tablet Take 1 tablet by mouth once daily 03/14/22   Elby Showers, MD  levothyroxine (SYNTHROID) 75 MCG tablet Take 1 tablet by mouth once daily 03/14/22   Elby Showers, MD  lisinopril (ZESTRIL) 5 MG tablet Take 1 tablet by mouth once daily 03/14/22   Elby Showers, MD  Multiple Vitamin (MULTIVITAMIN) tablet Take 1 tablet by mouth daily.    [provider]  Polyethyl Glycol-Propyl Glycol (SYSTANE ULTRA OP) Place 1 drop into both eyes 2 (two) times daily.    [provider]  triamcinolone cream (KENALOG) 0.1 % Apply 1 application topically 2 (two) times daily. Patient not taking: Reported on 06/21/2021 05/28/20   Elby Showers, MD  Hyoscyamine Sulfate SL (LEVSIN/SL) 0.125 MG SUBL One tab sublingual  one half hour before eating 05/23/18 04/10/20  Elby Showers, MD    Physical Exam: Vitals:   04/08/22 0500 04/08/22 0538 04/08/22 0600 04/08/22 0700  BP: (!) 136/52 (!) 150/64 (!) 155/63 (!) 144/57  Pulse: 67 77 79 78  Resp:      Temp:      TempSrc:      SpO2: 98% 95% 100% 99%     Constitutional: Elderly female currently in no acute distress Eyes: PERRL, lids and conjunctivae normal ENMT: Mucous membranes are dry.   Neck: normal, supple, no masses, no thyromegaly Respiratory: clear to auscultation bilaterally, no wheezing, no crackles. Normal respiratory effort. No accessory muscle use.  Cardiovascular: Regular rate and rhythm, no murmurs / rubs / gallops. No extremity edema. 2+ pedal pulses. No carotid bruits.  Abdomen: no tenderness, no masses palpated.  Bowel sounds seem to be decreased Musculoskeletal: no clubbing / cyanosis. No joint deformity upper and lower extremities. Good ROM, no contractures.   Skin: no rashes, lesions, ulcers. No induration Neurologic: CN 2-12 grossly intact.  Strength 5/5 in all 4.  Psychiatric: Normal judgment and insight. Alert and oriented x 3. Normal mood.   Data Reviewed:  EKG revealed normal sinus rhythm at 74 bpm.  Reviewed all  other labs, imaging, and pertinent records as noted above in HPI  Assessment and Plan:  Suspected colitis possible partial small bowel obstruction Acute patient presented with complaints of generalized abdominal pain with nausea and vomiting starting yesterday afternoon.  CT imaging concerning for colitis of the ascending colon with concern for inflammatory stricture formation, scattered short segments of small luminal narrowing mural thickening increased caliber bowel loops concerning for low-grade partial obstruction could not be excluded.  Patient's last bowel movement was approximately 2 days ago, but she reports change in bowel habits for the last 3 months with frequent small soft stools. -Admit to a telemetry bed -Monitor intake and output -Trial of clear liquid diet -Add on ESR and CRP -Continue empiric antibiotics of Rocephin and metronidazole -Morphine IV as needed for pain  Leukocytosis Acute.  WBC elevated 12.2.  Suspect secondary to above -Recheck CBC in a.m.  Renal  insufficiency Acute.  On admission creatinine elevated at 1.01 with BUN 22.  Baseline creatinine previously noted to be 0.77 in 2022.  Urinalysis did not show significant signs of infection.  This is not greater than 0.3 increased from baseline to suggest acute kidney injury.  Elevated BUN to creatinine ratio suggest prerenal cause of symptoms. -Continue normal saline IV fluids at 75 mL/h -Recheck kidney function in a.m.  Hoarse voice Patient reports intermittently having hoarse voice.   Essential hypertension On admission blood pressures 136/52-155/63.  Home medication regimen includes lisinopril 5 mg daily.  Possibly elevated in the setting of acute pain. -Continue lisinopril  Hypercalcemia Acute.  Calcium 10.6 on admission.  Suspect likely related with dehydration. -IV fluids -Recheck in a.m.  Hypothyroidism -add-on TSH -Continue levothyroxine  Anxiety -Continue Xanax as needed    DVT prophylaxis: Lovenox Advance Care Planning:   Code Status: Full Code   Consults: GI  Family Communication: Patient's son updated at bedside  Severity of Illness: The appropriate patient status for this patient is INPATIENT. Inpatient status is judged to be reasonable and necessary in order to provide the required intensity of service to ensure the patient's safety. The patient's presenting symptoms, physical exam findings, and initial radiographic and laboratory data in the context of their chronic comorbidities is felt to place them at high risk for further clinical deterioration. Furthermore, it is not anticipated that the patient will be medically stable for discharge from the hospital within 2 midnights of admission.   * I certify that at the point of admission it is my clinical judgment that the patient will require inpatient hospital care spanning beyond 2 midnights from the point of admission due to high intensity of service, high risk for further deterioration and high frequency of  surveillance required.*  Author: Norval Morton, MD 04/08/2022 7:28 AM  For on call review [email or web address].

## 2022-04-08 NOTE — ED Provider Notes (Signed)
7:04 AM Patient signed out to me by previous ED physician. 88 yo female with PMH of Signs of previous right partial colectomy with enterocolonic anastomosis presenting for abdominal pain, nausea, vomiting. CT abd scan positive for colitis. No evidence of high grade bowel obstruction. Unable to control pain with narcotics. Rocephin and flagyl given.   Plan:  Admission. Awaiting call back from hospitalist.   Physical Exam  BP (!) 155/63   Pulse 79   Temp 97.8 F (36.6 C) (Oral)   Resp 18   SpO2 100%   Physical Exam Vitals and nursing note reviewed.  Constitutional:      Appearance: She is well-developed. She is not ill-appearing or toxic-appearing.  Cardiovascular:     Rate and Rhythm: Normal rate and regular rhythm.  Pulmonary:     Effort: Pulmonary effort is normal.     Breath sounds: Normal breath sounds.  Abdominal:     Tenderness: There is generalized abdominal tenderness. There is no guarding or rebound.  Neurological:     General: No focal deficit present.     Mental Status: She is alert and oriented to person, place, and time.     GCS: GCS eye subscore is 4. GCS verbal subscore is 5. GCS motor subscore is 6.     Procedures  Procedures  ED Course / MDM    Medical Decision Making Amount and/or Complexity of Data Reviewed Labs: ordered. Radiology: ordered.  Risk Prescription drug management. Decision regarding hospitalization.   Patient accepted by Dr. Katrinka Blazing.        Edwin Dada P, DO 04/08/22 [phone]

## 2022-04-09 ENCOUNTER — Telehealth: Payer: Self-pay | Admitting: Internal Medicine

## 2022-04-09 ENCOUNTER — Inpatient Hospital Stay (HOSPITAL_COMMUNITY): Payer: Medicare Other

## 2022-04-09 DIAGNOSIS — I10 Essential (primary) hypertension: Secondary | ICD-10-CM | POA: Diagnosis not present

## 2022-04-09 DIAGNOSIS — N179 Acute kidney failure, unspecified: Secondary | ICD-10-CM

## 2022-04-09 DIAGNOSIS — E782 Mixed hyperlipidemia: Secondary | ICD-10-CM | POA: Diagnosis not present

## 2022-04-09 DIAGNOSIS — K529 Noninfective gastroenteritis and colitis, unspecified: Secondary | ICD-10-CM | POA: Diagnosis not present

## 2022-04-09 DIAGNOSIS — K56600 Partial intestinal obstruction, unspecified as to cause: Secondary | ICD-10-CM | POA: Diagnosis not present

## 2022-04-09 LAB — BASIC METABOLIC PANEL
Anion gap: 7 (ref 5–15)
BUN: 14 mg/dL (ref 8–23)
CO2: 25 mmol/L (ref 22–32)
Calcium: 8.1 mg/dL — ABNORMAL LOW (ref 8.9–10.3)
Chloride: 107 mmol/L (ref 98–111)
Creatinine, Ser: 0.75 mg/dL (ref 0.44–1.00)
GFR, Estimated: >60 mL/min (ref >60)
Glucose, Bld: 97 mg/dL (ref 70–99)
Potassium: 3.7 mmol/L (ref 3.5–5.1)
Sodium: 139 mmol/L (ref 135–145)

## 2022-04-09 LAB — CBC
HCT: 30.8 % — ABNORMAL LOW (ref 36.0–46.0)
Hemoglobin: 9.9 g/dL — ABNORMAL LOW (ref 12.0–15.0)
MCH: 30.4 pg (ref 26.0–34.0)
MCHC: 32.1 g/dL (ref 30.0–36.0)
MCV: 94.5 fL (ref 80.0–100.0)
Platelets: 159 10*3/uL (ref 150–400)
RBC: 3.26 MIL/uL — ABNORMAL LOW (ref 3.87–5.11)
RDW: 13.3 % (ref 11.5–15.5)
WBC: 6.6 10*3/uL (ref 4.0–10.5)
nRBC: 0 % (ref 0.0–0.2)

## 2022-04-09 MED ORDER — POLYETHYLENE GLYCOL 3350 17 G PO PACK
17.0000 g | PACK | Freq: Every day | ORAL | Status: DC
  Administered 2022-04-09 – 2022-04-11 (×2): 17 g via ORAL
  Filled 2022-04-09 (×3): qty 1

## 2022-04-09 NOTE — Assessment & Plan Note (Addendum)
Hypercalcemia.  Renal function with serum cr at 0,72 K is 3,7 and serum bicarbonate at 25 Ca 8,1 from 10,6   Patient with poor oral intake.  Continue hydration with isotonic saline, follow up renal function and electrolytes in am.

## 2022-04-09 NOTE — Progress Notes (Signed)
  Progress Note   Patient: Dominique Livingston UUV:[id number] DOB: [birth year] DOA: 04/08/2022     1 DOS: the patient was seen and examined on 04/09/2022   Brief hospital course: Mrs. Lindbloom was admitted to the hospital with the working diagnosis of colitis.   88 yo female with the past medical history of hypertension, dyslipidemia, hx of partial colectomy with entero-colic anastomosis and GERD who presented with abdominal pain. Acute onset of symptoms, severe sharp abdominal pain, with nausea and vomiting. On her initial physical examination her blood pressure was 136/52, HR 77, 02 saturation 95%, lungs clear to auscultation, heart with S1 and S2 present and rhythmic, abdomen not tender or distended, no lower extremity edema.   Na 140, K 4,3 Cl 100 bicarbonate 26, glucose 176 bun 22 and cr 1,0  Wbc 12,2 hgb 13 plt 231 Urine analysis SG  1.018 negative protein.   Ct abdomen and pelvis with mild wall thickening and enhancement involving the ascending colon just distal to the anastomosis concerning for acute segment colitis. Inflammatory enteritis. Low grade partial small bowel obstruction can not be excluded.   EKG 74 bpm, normal axis, normal intervals, sinus rhythm with no significant ST segment or T wave changes.   Patient was placed on IV antibiotic therapy and supportive medical therapy.  Possible inpatient endoscopic procedure.   Assessment and Plan: * Colitis Partial small bowel obstruction  Patient continue with abdominal pain, no nausea or vomiting.  Wbc 6,6.   Plan to continue with IV fluids isotonic saline at 75 ml per hr Antibiotic therapy with ceftriaxone and metronidazole. Pain control with morphine.  Bowel regimen per GI recommendations.  Patient may need endoscopic work up during this hospitalization.   AKI (acute kidney injury) (HCC) Hypercalcemia.  Renal function with serum cr at 0,72 K is 3,7 and serum bicarbonate at 25 Ca 8,1 from 10,6   Patient with poor oral  intake.  Continue hydration with isotonic saline, follow up renal function and electrolytes in am.   Essential hypertension Continue close blood pressure monitoring.   Hyperlipidemia Continue with statin therapy.         Subjective: Patient with abdominal pain, mild to moderate in intensity, poor appetite, no nausea or vomiting.   Physical Exam: Vitals:   04/09/22 0326 04/09/22 0343 04/09/22 0830 04/09/22 1139  BP:  (!) 115/44 119/62 (!) 146/66  Pulse:   67 64  Resp:   18 18  Temp:   97.9 F (36.6 C) 98 F (36.7 C)  TempSrc:   Oral Oral  SpO2:   99% 97%  Weight: 60.7 kg     Height:       Neurology awake and alert ENT with mild pallor Cardiovascular with S1 and S2 present and rhythmic with no gallops Respiratory with no wheezing Abdomen mild distended, not tender to superficial palpation, tympanic to percussion No  lower extremity edema  Data Reviewed:    Family Communication: I spoke with patient's daufhter at the bedside, we talked in detail about patient's condition, plan of care and prognosis and all questions were addressed.   Disposition: Status is: Inpatient Remains inpatient appropriate because: abdominal pain and ongoing colitis. Need for IV antibiotic therapy   Planned Discharge Destination: Home    Author: Coralie Keens, MD 04/09/2022 3:15 PM  For on call review [email or web address].

## 2022-04-09 NOTE — Plan of Care (Signed)
  Problem: Nutrition: Goal: Adequate nutrition will be maintained Outcome: Progressing   Problem: Elimination: Goal: Will not experience complications related to bowel motility Outcome: Progressing   

## 2022-04-09 NOTE — Assessment & Plan Note (Signed)
Continue with statin therapy.  ?

## 2022-04-09 NOTE — Hospital Course (Addendum)
Dominique Livingston was admitted to the hospital with the working diagnosis of colitis.   88 yo female with the past medical history of hypertension, dyslipidemia, hx of partial colectomy with entero-colic anastomosis and GERD who presented with abdominal pain. Acute onset of symptoms, severe sharp abdominal pain, with nausea and vomiting. On her initial physical examination her blood pressure was 136/52, HR 77, 02 saturation 95%, lungs clear to auscultation, heart with S1 and S2 present and rhythmic, abdomen not tender or distended, no lower extremity edema.   Na 140, K 4,3 Cl 100 bicarbonate 26, glucose 176 bun 22 and cr 1,0  Wbc 12,2 hgb 13 plt 231 Urine analysis SG  1.018 negative protein.   Ct abdomen and pelvis with mild wall thickening and enhancement involving the ascending colon just distal to the anastomosis concerning for acute segment colitis. Inflammatory enteritis. Low grade partial small bowel obstruction can not be excluded.   EKG 74 bpm, normal axis, normal intervals, sinus rhythm with no significant ST segment or T wave changes.   Patient was placed on IV antibiotic therapy and supportive medical therapy.  Possible inpatient endoscopic procedure.

## 2022-04-09 NOTE — Assessment & Plan Note (Addendum)
Partial small bowel obstruction  Patient continue with abdominal pain, no nausea or vomiting.  Wbc 6,6.   Plan to continue with IV fluids isotonic saline at 75 ml per hr Antibiotic therapy with ceftriaxone and metronidazole. Pain control with morphine.  Bowel regimen per GI recommendations.  Patient may need endoscopic work up during this hospitalization.

## 2022-04-09 NOTE — Assessment & Plan Note (Signed)
Continue close blood pressure monitoring  

## 2022-04-09 NOTE — Progress Notes (Signed)
Patient complaining of abdominal pain and constipation. Tap water enema ordered per MD, attempted to administer enema multiple times but patient has refused as of now. Patient states "want to see if the miralax will work first", will continue to offer enema as ordered. Patient in no acute distress at this time.

## 2022-04-09 NOTE — Telephone Encounter (Signed)
Received urgent call from patient's daughter Dominique Livingston regarding patient having abdominal pain for several hours. No fever, chills but vomited reddish material not thought to be blood once. Had eaten a tomato sandwich. Daughter concerned but did not want patient to have lengthy wait in ED. Options discussed but advised low threshold to take her for evaluation since this was ongoing for a few hours. MJB. MD

## 2022-04-09 NOTE — Progress Notes (Addendum)
Progress Note   Subjective  Chief Complaint: Abnormal CT of the abdomen  This morning, the patient tells me that she still does not feel great.  She tried to drink her clear liquids this morning and immediately started with intense abdominal pain again and increased nausea.  She did pass 2 small stools this morning.   Objective   Vital signs in last 24 hours: Temp:  [97.9 F (36.6 C)-98.5 F (36.9 C)] 97.9 F (36.6 C) (07/09 0830) Pulse Rate:  [62-76] 67 (07/09 0830) Resp:  [13-18] 18 (07/09 0830) BP: (88-154)/(44-78) 119/62 (07/09 0830) SpO2:  [94 %-99 %] 99 % (07/09 0830) Weight:  [60.6 kg-60.7 kg] 60.7 kg (07/09 0326) Last BM Date : 04/08/22 General:    Elderly white female in NAD Heart:  Regular rate and rhythm; no murmurs Lungs: Respirations even and unlabored, lungs CTA bilaterally Abdomen: Tense, moderate-marked generalized TTP and nondistended. Normal bowel sounds. Psych:  Cooperative. Normal mood and affect.  Intake/Output from previous day: 07/08 0701 - 07/09 0700 In: 1931.9 [P.O.:480; I.V.:1051.5; IV Piggyback:400.4] Out: 101 [Urine:101] Intake/Output this shift: Total I/O In: 358 [P.O.:358] Out: 600 [Urine:600]  Lab Results: Recent Labs    04/08/22 0322 04/09/22 0139  WBC 12.2* 6.6  HGB 13.0 9.9*  HCT 40.8 30.8*  PLT 231 159   BMET Recent Labs    04/08/22 0322 04/09/22 0139  NA 140 139  K 4.3 3.7  CL 100 107  CO2 26 25  GLUCOSE 176* 97  BUN 22 14  CREATININE 1.01* 0.75  CALCIUM 10.6* 8.1*   LFT Recent Labs    04/08/22 0322  PROT 8.0  ALBUMIN 4.5  AST 25  ALT 23  ALKPHOS 56  BILITOT 0.7   Studies/Results: DG Abd Portable 1V  Result Date: 04/09/2022 CLINICAL DATA:  Nausea. EXAM: PORTABLE ABDOMEN - 1 VIEW COMPARISON:  CT 04/08/2022 FINDINGS: Status post cholecystectomy. The bowel gas pattern appears nonobstructive. Gas and stool noted throughout the colon up to the rectum. A few prominent loops of small bowel are noted in the right  lower quadrant which measure up to 2.1 cm. The bowel gas pattern is normal. No radio-opaque calculi or other significant radiographic abnormality are seen. IMPRESSION: 1. Nonobstructive bowel gas pattern. 2. Mildly prominent loops of small bowel in the right lower quadrant are nonspecific. Electronically Signed   By: Signa Kell M.D.   On: 04/09/2022 07:08   CT ABDOMEN PELVIS W CONTRAST  Result Date: 04/08/2022 CLINICAL DATA:  Abdominal pain.  Nausea and vomiting EXAM: CT ABDOMEN AND PELVIS WITH CONTRAST TECHNIQUE: Multidetector CT imaging of the abdomen and pelvis was performed using the standard protocol following bolus administration of intravenous contrast. RADIATION DOSE REDUCTION: This exam was performed according to the departmental dose-optimization program which includes automated exposure control, adjustment of the mA and/or kV according to patient size and/or use of iterative reconstruction technique. CONTRAST:  OMNIPAQUE IOHEXOL 300 MG/ML  SOLN COMPARISON:  01/17/2018 FINDINGS: Lower chest: No acute abnormality.4 mm nodule identified within the left lower lobe, image 3/5. Unchanged from comparison exam Hepatobiliary: No suspicious liver lesion. Status post cholecystectomy. Chronic increase caliber of the common bile duct measures up to 1.1 cm. No intrahepatic bile duct dilatation identified. Pancreas: Unremarkable. No pancreatic ductal dilatation or surrounding inflammatory changes. Spleen: Normal in size without focal abnormality. Adrenals/Urinary Tract: Normal adrenal glands. Simple appearing kidney cysts identified. No follow-up imaging recommended. No nephrolithiasis or hydronephrosis identified bilaterally. Urinary bladder is unremarkable. Stomach/Bowel: The stomach  appears normal. Proximal small bowel loops are unremarkable. The mid and distal small bowel loops are mildly increased in caliber measuring up to 2.7 cm. No signs of high-grade bowel obstruction. There are a few scattered  short segment areas of small bowel luminal narrowing, mural enhancement and mild mural thickening. Signs previous right partial right colectomy with enterocolonic anastomosis identified. The mildly dilated small bowel loops extend up to the anastomosis. There is mild wall thickening and enhancement involving the ascending colon just distal to the anastomosis concerning for acute colitis with possible underlying inflammatory stricture formation. The remaining portions of the colon appear unremarkable. No signs of bowel perforation. Vascular/Lymphatic: Aortic atherosclerosis. No abdominopelvic adenopathy. There is edema/venous congestion identified within the lower abdominal small bowel mesentery, image 58/3. Reproductive: Status post hysterectomy. No adnexal masses. Other: No significant free fluid. No fluid collections. No signs of pneumoperitoneum Musculoskeletal: Spondylosis identified within the lumbar spine. IMPRESSION: 1. Signs of previous right partial colectomy with enterocolonic anastomosis. There is mild wall thickening and enhancement involving the ascending colon just distal to the anastomosis concerning for acute segment colitis with possible underlying inflammatory stricture formation. Correlate for any clinical signs/symptoms of inflammatory bowel disease such as Crohn's disease. 2. There are a scattered short segments of small bowel luminal narrowing, mural enhancement and mild mural thickening. Findings are concerning for inflammatory enteritis. 3. Mildly increased caliber of the mid and distal small bowel loops are identified. Low-grade partial obstruction cannot be excluded. 4. There is edema/venous congestion within the lower abdominal small bowel mesentery. This is a nonspecific finding but can be seen in the setting of inflammatory bowel disease. 5. 4 mm nodule identified within the left lower lobe. Unchanged from previous exam compatible with a benign abnormality. 6. Aortic Atherosclerosis  (ICD10-I70.0). Electronically Signed   By: Signa Kell M.D.   On: 04/08/2022 06:25      Assessment / Plan:   Assessment: 1.  Abnormal CT of the abdomen: With question of acute segment colitis and possible IBD as well as inflammatory enteritis with low-grade partial small bowel obstruction, last colonoscopy in 2011 with no sign of IBD, repeat x-ray this morning was fairly normal; likely infectious versus inflammatory versus stricture at previous anastomosis 2.  Abdominal pain: With above 3.  Constipation: For the past couple of weeks/month felt constipated but worse over the past 2 days, did produce 2 small stools this morning; likely with above 4.  Leukocytosis: WBC elevated at 12.2 at time of admission--> 6.6 today  Plan: 1.  Continue antibiotics for now 2.  Again no emergent plans for colonoscopy but will need to be needed in the future if symptoms do not start to improve 3.  Continue clear liquid diet as tolerated 4.  Antiemetics and analgesics as needed  Thank you for your kind consultation, we will continue to follow   LOS: 1 day   Unk Lightning  04/09/2022, 10:06 AM  GI attending:  I have also seen and evaluated the patient.  She is not worse but she is not better.  Abdomen still somewhat distended and tympanitic.  Still complaining of incomplete defecation.  Not ready for a colonoscopy.  I think she may need 1.  I am going to give her an enema to see if that promotes better defecation and we will reassess tomorrow.  Iva Boop, MD, [facility] See Loretha Stapler on call - gastroenterology for best contact person 04/09/2022 1:15 PM

## 2022-04-10 DIAGNOSIS — R1084 Generalized abdominal pain: Secondary | ICD-10-CM

## 2022-04-10 DIAGNOSIS — K529 Noninfective gastroenteritis and colitis, unspecified: Secondary | ICD-10-CM | POA: Diagnosis not present

## 2022-04-10 DIAGNOSIS — N179 Acute kidney failure, unspecified: Secondary | ICD-10-CM | POA: Diagnosis not present

## 2022-04-10 LAB — CBC
HCT: 30.3 % — ABNORMAL LOW (ref 36.0–46.0)
Hemoglobin: 9.8 g/dL — ABNORMAL LOW (ref 12.0–15.0)
MCH: 30.3 pg (ref 26.0–34.0)
MCHC: 32.3 g/dL (ref 30.0–36.0)
MCV: 93.8 fL (ref 80.0–100.0)
Platelets: 156 10*3/uL (ref 150–400)
RBC: 3.23 MIL/uL — ABNORMAL LOW (ref 3.87–5.11)
RDW: 13.2 % (ref 11.5–15.5)
WBC: 5.3 10*3/uL (ref 4.0–10.5)
nRBC: 0 % (ref 0.0–0.2)

## 2022-04-10 LAB — URINE CULTURE: Culture: NO GROWTH

## 2022-04-10 LAB — BASIC METABOLIC PANEL
Anion gap: 8 (ref 5–15)
BUN: 7 mg/dL — ABNORMAL LOW (ref 8–23)
CO2: 23 mmol/L (ref 22–32)
Calcium: 8.2 mg/dL — ABNORMAL LOW (ref 8.9–10.3)
Chloride: 112 mmol/L — ABNORMAL HIGH (ref 98–111)
Creatinine, Ser: 0.72 mg/dL (ref 0.44–1.00)
GFR, Estimated: >60 mL/min (ref >60)
Glucose, Bld: 86 mg/dL (ref 70–99)
Potassium: 3.6 mmol/L (ref 3.5–5.1)
Sodium: 143 mmol/L (ref 135–145)

## 2022-04-10 LAB — MAGNESIUM: Magnesium: 2 mg/dL (ref 1.7–2.4)

## 2022-04-10 MED ORDER — ORAL CARE MOUTH RINSE: 15.0000 mL | OROMUCOSAL | Status: DC | PRN

## 2022-04-10 MED ORDER — DICYCLOMINE HCL 10 MG PO CAPS
10.0000 mg | ORAL_CAPSULE | Freq: Three times a day (TID) | ORAL | Status: DC
  Administered 2022-04-10 – 2022-04-14 (×15): 10 mg via ORAL
  Filled 2022-04-10 (×15): qty 1

## 2022-04-10 MED ORDER — LORATADINE 10 MG PO TABS
10.0000 mg | ORAL_TABLET | Freq: Every day | ORAL | Status: DC
  Administered 2022-04-11 – 2022-04-14 (×4): 10 mg via ORAL
  Filled 2022-04-10 (×4): qty 1

## 2022-04-10 NOTE — Progress Notes (Signed)
Mobility Specialist Progress Note:   04/10/22 0916  Mobility  Activity Ambulated with assistance in hallway  Level of Assistance Standby assist, set-up cues, supervision of patient - no hands on  Assistive Device None  Distance Ambulated (ft) 550 ft  Activity Response Tolerated well  $Mobility charge 1 Mobility   Pt received EOB wiling to participate in mobility. No complaints of pain. Left EOB with call bell in reach and all needs met.   [facility] Taylynn Easton Mobility Specialist

## 2022-04-10 NOTE — Progress Notes (Signed)
  Progress Note   Patient: Dominique Livingston:[id number] DOB: October 23, 1932 DOA: 04/08/2022     2 DOS: the patient was seen and examined on 04/10/2022   Brief hospital course: Dominique Livingston was admitted to the hospital with the working diagnosis of colitis.    88 yo female with the past medical history of hypertension, dyslipidemia, hx of partial colectomy with entero-colic anastomosis and GERD who presented with abdominal pain. Acute onset of symptoms, severe sharp abdominal pain, with nausea and vomiting.   Ct abdomen and pelvis with mild wall thickening and enhancement involving the ascending colon just distal to the anastomosis concerning for acute segment colitis. Inflammatory enteritis. Low grade partial small bowel obstruction can not be excluded.    Patient was placed on IV antibiotic therapy and supportive medical therapy.  Possible inpatient endoscopic procedure.   Assessment and Plan: Colitis Presenting with abdominal pain, nausea, vomiting. Hx chronic constipation. Several abdominal surgeries including partial colonic resection. With partial small bowel obstruction that now appears resolved No vomiting since arrival CT on 7/8 with ascending colon wall thickening and enhancement concerning for inflammation. Had loose BM overnight Abd pain improving. Hx of constipation Plan to continue antibiotics (ceftriaxone and metronidazole). GI is following, w/u per them  AKI (acute kidney injury) (HCC) Resolved with hydration  Essential hypertension Bp wnl, home lisinopril on hold  Hyperlipidemia Continue with statin therapy.   Hypothyroid Cont home levothyroxine. Tsh wnl     Subjective: no vomiting. Large bm this morning, loose. Abd pain persists but improving  Physical Exam: Vitals:   04/09/22 1139 04/09/22 1914 04/10/22 0406 04/10/22 1041  BP: (!) 146/66 (!) 142/52 (!) 116/51 138/73  Pulse: 64 75 63 64  Resp: 18 18 18 17   Temp: 98 F (36.7 C) 97.7 F (36.5 C) 97.7 F  (36.5 C) 97.7 F (36.5 C)  TempSrc: Oral Oral Oral Oral  SpO2: 97% 97% 96% 100%  Weight:   60.6 kg   Height:       Neurology awake and alert ENT with mild pallor Cardiovascular with S1 and S2 present and rhythmic with no gallops Respiratory with no wheezing Abdomen mild distended, not tender to superficial palpation, tympanic to percussion No  lower extremity edema  Data Reviewed:    Family Communication: daughter updated @ bedside 7/10   Disposition: Status is: Inpatient Remains inpatient appropriate because: abdominal pain and ongoing colitis. Need for IV antibiotic therapy   Planned Discharge Destination: Home   Author: 9/10, MD 04/10/2022 3:14 PM  For on call review [email or web address].

## 2022-04-10 NOTE — Progress Notes (Addendum)
Attending physician's note   I have taken a history, reviewed the chart, and examined the patient. I performed a substantive portion of this encounter, including complete performance of at least one of the key components, in conjunction with the APP. I agree with the APP's note, impression, and recommendations with my edits.   Started having bowel movements since starting MiraLAX.  Still with abdominal pain which tends to occur immediately after trialing any liquids.  Able to tolerate water without issue, but with pain immediately after drinking milk or broth.  CT from 7/8 reviewed.  Interesting that she had normal ESR/CRP despite concern for ascending colitis and inflammatory enteritis on CT.  WBC has normalized and H/H stable at 9.8/30 after decline earlier on this admission.  -Trial course of scheduled Bentyl - If continued pain, plan for CT enterography for further evaluation of the enteritis, area of luminal narrowing, and evaluate for resolution of segmental colitis -She wants to avoid colonoscopy if at all possible.  Will continue to reevaluate for role/utility depending on symptoms and/or CT - Continue trending CBC daily - P.o. intake as tolerated - GI service will continue to follow  Gerrit Heck, DO, FACG ([id number]) [phone] office                Daily Rounding Note  04/10/2022, 2:11 PM  LOS: 2 days   SUBJECTIVE:   Chief complaint: Generalized abdominal pain.  CT concerning for segmental colitis at ascending colon and scattered areas of luminal narrowing and small bowel concerning for inflammatory enteritis.  Associated mild dilatation in small bowel loops concerning for low-grade obstruction.  Patient's been having loose, brown, nonbloody stools since last night.  She did not require the tapwater enema.  Still has pain after eating even substances as mild as chicken broth though able to tolerate water and some juices.   Milk also caused increased pain.  TTP on exam persists.  Pain is generalized.  OBJECTIVE:         Vital signs in last 24 hours:    Temp:  [97.7 F (36.5 C)] 97.7 F (36.5 C) (07/10 1041) Pulse Rate:  [63-75] 64 (07/10 1041) Resp:  [17-18] 17 (07/10 1041) BP: (116-142)/(51-73) 138/73 (07/10 1041) SpO2:  [96 %-100 %] 100 % (07/10 1041) Weight:  [60.6 kg] 60.6 kg (07/10 0406) Last BM Date : 04/10/22 Filed Weights   04/08/22 1530 04/09/22 0326 04/10/22 0406  Weight: 60.6 kg 60.7 kg 60.6 kg   General: Elderly, looks well, not acutely ill appearing Heart: RRR Chest: Clear bilaterally.  No labored breathing or cough Abdomen: Soft.  Diffusely mild to moderately tender without guarding or rebound.  Bowel sounds are normal but hypoactive. Extremities: No CCE. Neuro/Psych: Walking independently in the room.  Fluid speech.  Not confused.  Intake/Output from previous day: 07/09 0701 - 07/10 0700 In: 2580.3 [P.O.:838; I.V.:1544; IV Piggyback:198.4] Out: 2200 [Urine:2200]  Intake/Output this shift: Total I/O In: 120 [P.O.:120] Out: 400 [Urine:400]  Lab Results: Recent Labs    04/08/22 0322 04/09/22 0139 04/10/22 0305  WBC 12.2* 6.6 5.3  HGB 13.0 9.9* 9.8*  HCT 40.8 30.8* 30.3*  PLT 231 159 156   BMET Recent Labs    04/08/22 0322 04/09/22 0139 04/10/22 0305  NA 140 139 143  K 4.3 3.7 3.6  CL 100 107 112*  CO2 '26 25 23  ' GLUCOSE 176* 97 86  BUN 22 14 7*  CREATININE 1.01* 0.75 0.72  CALCIUM 10.6* 8.1* 8.2*   LFT  Recent Labs    04/08/22 0322  PROT 8.0  ALBUMIN 4.5  AST 25  ALT 23  ALKPHOS 56  BILITOT 0.7   PT/INR No results for input(s): "LABPROT", "INR" in the last 72 hours. Hepatitis Panel No results for input(s): "HEPBSAG", "HCVAB", "HEPAIGM", "HEPBIGM" in the last 72 hours.  Studies/Results: DG Abd Portable 1V  Result Date: 04/09/2022 CLINICAL DATA:  Nausea. EXAM: PORTABLE ABDOMEN - 1 VIEW COMPARISON:  CT 04/08/2022 FINDINGS: Status post cholecystectomy.  The bowel gas pattern appears nonobstructive. Gas and stool noted throughout the colon up to the rectum. A few prominent loops of small bowel are noted in the right lower quadrant which measure up to 2.1 cm. The bowel gas pattern is normal. No radio-opaque calculi or other significant radiographic abnormality are seen. IMPRESSION: 1. Nonobstructive bowel gas pattern. 2. Mildly prominent loops of small bowel in the right lower quadrant are nonspecific. Electronically Signed   By: Kerby Moors M.D.   On: 04/09/2022 07:08    Scheduled Meds:  enoxaparin (LOVENOX) injection  40 mg Subcutaneous Q24H   levothyroxine  75 mcg Oral Q0600   ondansetron (ZOFRAN) IV  4 mg Intravenous Once   polyethylene glycol  17 g Oral Daily   sodium chloride flush  3 mL Intravenous Q12H   Continuous Infusions:  sodium chloride 75 mL/hr at 04/10/22 0842   cefTRIAXone (ROCEPHIN)  IV 2 g (04/10/22 0528)   And   metronidazole 500 mg (04/10/22 0844)   PRN Meds:.acetaminophen **OR** acetaminophen, albuterol, ALPRAZolam, morphine injection, ondansetron **OR** ondansetron (ZOFRAN) IV, mouth rinse   ASSESMENT:     Generalized abdominal pain.  Progressive chronic constipation.  CT concerning for segmental colitis at ascending colon and scattered areas of luminal narrowing and small bowel concerning for inflammatory enteritis.  Associated mild dilatation in small bowel loops concerning for low-grade obstruction. NOBGP, nonspecific prominent loops of small bowel on todays KUB  Day 4 ceftriaxone, day 3 metronidazole.  Hypocalcemia.  Pt is not hypoalbuminemic and calcium corrected for her normal albumin measures 8 mg/dL    [place] anemia.    Hx endometriosis.  S/p  2 surgeries for this, 1 of these involved obstruction at the sigmoid colon for which pt underwent L colectomy 1969.  Has also had hysterectomy, right salpingo-oophorectomy.   PLAN   May require colonoscopy but do not think she would tolerate prep (pt concurrs). The R  side of colon is the problem and likely would not be reachable on flex sig.  Repeat CT scan???    Dominique Livingston  04/10/2022, 2:11 PM Phone [phone]

## 2022-04-10 NOTE — Consult Note (Signed)
   [facility] Inpatient Consult   04/10/2022  Dominique Livingston 1932/10/28 [id number]  [facility] [ACO] Patient: Medication ACO REACH  Primary Care Provider:  Elby Showers, MD  is an embedded provider with a Chronic Care Management team and program, and is listed for the transition of care follow up and appointments.  Patient was screened for Embedded practice service needs for chronic care management no current needs discussed in unit progression this morning.  1400: Met with the patient and daughter at the bedside regarding any post hospital needs.  Brief SDOH needs reviewed for transportation or food, no needs assessed.  Plan: Patient was given an appointment reminder card and a 24 hour nurse advise line magnet.  Patient states no current needs. Continue to follow during hospital stay.  Please contact for further questions,  Natividad Brood, RN BSN [facility] Liaison  [phone] business mobile phone Toll free office [phone]  Fax number: [phone] [email or web address]

## 2022-04-11 DIAGNOSIS — R1084 Generalized abdominal pain: Secondary | ICD-10-CM | POA: Diagnosis not present

## 2022-04-11 DIAGNOSIS — K529 Noninfective gastroenteritis and colitis, unspecified: Secondary | ICD-10-CM | POA: Diagnosis not present

## 2022-04-11 DIAGNOSIS — R197 Diarrhea, unspecified: Secondary | ICD-10-CM

## 2022-04-11 LAB — CBC
HCT: 31.3 % — ABNORMAL LOW (ref 36.0–46.0)
Hemoglobin: 10.3 g/dL — ABNORMAL LOW (ref 12.0–15.0)
MCH: 30.7 pg (ref 26.0–34.0)
MCHC: 32.9 g/dL (ref 30.0–36.0)
MCV: 93.2 fL (ref 80.0–100.0)
Platelets: 172 10*3/uL (ref 150–400)
RBC: 3.36 MIL/uL — ABNORMAL LOW (ref 3.87–5.11)
RDW: 13 % (ref 11.5–15.5)
WBC: 5.5 10*3/uL (ref 4.0–10.5)
nRBC: 0 % (ref 0.0–0.2)

## 2022-04-11 LAB — BASIC METABOLIC PANEL
Anion gap: 13 (ref 5–15)
BUN: 7 mg/dL — ABNORMAL LOW (ref 8–23)
CO2: 26 mmol/L (ref 22–32)
Calcium: 9 mg/dL (ref 8.9–10.3)
Chloride: 106 mmol/L (ref 98–111)
Creatinine, Ser: 0.77 mg/dL (ref 0.44–1.00)
GFR, Estimated: >60 mL/min (ref >60)
Glucose, Bld: 94 mg/dL (ref 70–99)
Potassium: 3.8 mmol/L (ref 3.5–5.1)
Sodium: 145 mmol/L (ref 135–145)

## 2022-04-11 NOTE — Progress Notes (Signed)
Per progression, pt admitted w/ Colitis and may need colonoscopy; not tolerating prep; slowly preping her. Independent from home. TOC will follow for any potential DC needs.

## 2022-04-11 NOTE — Care Management Important Message (Signed)
Important Message  Patient Details  Name: Dominique Livingston MRN: [id number] Date of Birth: 02-05-1933   Medicare Important Message Given:  Yes     [name] 04/11/2022, 3:24 PM

## 2022-04-11 NOTE — Plan of Care (Signed)
  Problem: Clinical Measurements: Goal: Diagnostic test results will improve Outcome: Progressing Goal: Respiratory complications will improve Outcome: Progressing Goal: Cardiovascular complication will be avoided Outcome: Progressing   Problem: Activity: Goal: Risk for activity intolerance will decrease Outcome: Progressing   Problem: Coping: Goal: Level of anxiety will decrease Outcome: Progressing   Problem: Pain Managment: Goal: General experience of comfort will improve Outcome: Progressing   Problem: Safety: Goal: Ability to remain free from injury will improve Outcome: Progressing   

## 2022-04-11 NOTE — Progress Notes (Signed)
Mobility Specialist Progress Note    04/11/22 1504  Mobility  Activity Ambulated independently in hallway  Level of Assistance Standby assist, set-up cues, supervision of patient - no hands on  Assistive Device None  Distance Ambulated (ft) 550 ft  Activity Response Tolerated well  $Mobility charge 1 Mobility   Pre-Mobility: 60 HR During Mobility: 80 HR Post-Mobility: 76 HR  Pt received in bed and agreeable. No complaints on walk. Returned to bed with call bell in reach.    [place] Mobility Specialist

## 2022-04-11 NOTE — Progress Notes (Signed)
PROGRESS NOTE    Dominique Livingston  EXB:[id number]  DOB: [birth year]  DOA: 04/08/2022 PCP: Elby Showers, MD Outpatient Specialists:   Hospital course:  88 year old female with HTN/HL, chronic constipation, s/p partial colectomy with enterocolonic anastomoses was admitted 04/08/2022 with abdominal pain nausea and vomiting.  CT abdomen pelvis showed mild wall thickening and enhancement involving ascending colon just distal to the anastomosis concerning for acute segmental colitis versus inflammatory enteritis.  Patient with significant pain after eating and after admission patient has despite elevated watery diarrhea.  Further work-up including ESR and CRP were negative.  Subjective:  Patient states that abdominal pain might be somewhat improved although she does have GI discomfort with liquids.  Her main concern is ongoing diarrhea, states she has had up to 8 stools in the past 24 hours.  Denies any further nausea or vomiting since admission.   Objective: Vitals:   04/10/22 1952 04/11/22 0300 04/11/22 0355 04/11/22 1035  BP: (!) 159/77  121/65 139/71  Pulse: 72  63 62  Resp: _0 Temp: (!) 97.3 F (36.3 C)  97.7 F (36.5 C) (!) 97.5 F (36.4 C)  TempSrc: Oral  Oral Oral  SpO2: 96%  97% 95%  Weight:  60.2 kg    Height:        Intake/Output Summary (Last 24 hours) at 04/11/2022 1149 Last data filed at 04/11/2022 0918 Gross per 24 hour  Intake 900 ml  Output 1800 ml  Net -900 ml   Filed Weights   04/09/22 0326 04/10/22 0406 04/11/22 0300  Weight: 60.7 kg 60.6 kg 60.2 kg     Exam:  General: Reasonably well-appearing patient lying in bed in NAD with hands on abdomen. Eyes: sclera anicteric, conjuctiva mild injection bilaterally CVS: S1-S2, regular  Respiratory:  decreased air entry bilaterally secondary to decreased inspiratory effort, rales at bases  GI: Patient does have bowel sounds, normal pitch and tone, no guarding, mild tenderness to light palpation  diffusely. LE: No edema.  Neuro: A/O x 3, grossly nonfocal.  Psych: patient is logical and coherent, judgement and insight appear normal, mood and affect appropriate to situation.   Assessment & Plan:   GI distress Initially presented with nausea and vomiting with concern for obstruction CT with ascending colon wall thickening and enhancement concerning for inflammation Started on Flagyl and ceftriaxone day #3 Patient has now developed diarrhea, but has chronic constipation ESR/CRP are WNL, WBC normalized the day after admission with hydration and antibiotics GI is recommending Bentyl which has been started Patient may need to CT enterography per GI recommendations Patient wants to avoid colonoscopy if at all possible Continue liquid diet  HTN Home lisinopril on hold, can restart once BP rebounds  Hypothyroidism Continue Synthroid per home doses   DVT prophylaxis: Lovenox Code Status: Full Family Communication: None today Disposition Plan:   Patient is from: Home  Anticipated Discharge Location: Home  Barriers to Discharge: Ongoing diarrhea and abdominal pain  Is patient medically stable for Discharge: Not yet   Scheduled Meds:  dicyclomine  10 mg Oral TID AC & HS   enoxaparin (LOVENOX) injection  40 mg Subcutaneous Q24H   levothyroxine  75 mcg Oral Q0600   loratadine  10 mg Oral Daily   ondansetron (ZOFRAN) IV  4 mg Intravenous Once   sodium chloride flush  3 mL Intravenous Q12H   Continuous Infusions:  cefTRIAXone (ROCEPHIN)  IV 2 g (04/11/22 0546)   And   metronidazole 500 mg (  04/11/22 1012)    Data Reviewed:  Basic Metabolic Panel: Recent Labs  Lab 04/08/22 0322 04/09/22 0139 04/10/22 0305 04/11/22 0437  NA 140 139 143 145  K 4.3 3.7 3.6 3.8  CL 100 107 112* 106  CO2 _0 GLUCOSE 176* 97 86 94  BUN 22 14 7* 7*  CREATININE 1.01* 0.75 0.72 0.77  CALCIUM 10.6* 8.1* 8.2* 9.0  MG  --   --  2.0  --     CBC: Recent Labs  Lab 04/08/22 0322  04/09/22 0139 04/10/22 0305 04/11/22 0437  WBC 12.2* 6.6 5.3 5.5  HGB 13.0 9.9* 9.8* 10.3*  HCT 40.8 30.8* 30.3* 31.3*  MCV 94.2 94.5 93.8 93.2  PLT 231 159 156 172    Studies: No results found.  Principal Problem:   Colitis Active Problems:   AKI (acute kidney injury) ([place])   Essential hypertension   Hyperlipidemia   Generalized abdominal pain     [employer] Derek Jack, [facility]  If 7PM-7AM, please contact night-coverage [email or web address]   LOS: 3 days

## 2022-04-11 NOTE — Progress Notes (Signed)
[facility] ROUNDING NOTE   Subjective: 3-4 episodes of watery stools/diarrhea overnight and continued watery stools this morning.  Was still receiving MiraLAX now.  Overall abdominal pain has improved since starting Bentyl yesterday.  H/H improved at 10.3/31.3.  Normal WBC and renal function   Objective: Vital signs in last 24 hours: Temp:  [97.3 F (36.3 C)-97.7 F (36.5 C)] 97.5 F (36.4 C) (07/11 1035) Pulse Rate:  [62-72] 62 (07/11 1035) Resp:  [18] 18 (07/11 1035) BP: (121-159)/(65-77) 139/71 (07/11 1035) SpO2:  [95 %-97 %] 95 % (07/11 1035) Weight:  [60.2 kg] 60.2 kg (07/11 0300) Last BM Date : 04/11/22 General: NAD Lungs:  CTA b/l, no w/r/r Heart:  RRR, no m/r/g Abdomen: Mild TTP in upper abdomen.  No peritoneal signs.  Soft, ND, +BS    Intake/Output from previous day: 07/10 0701 - 07/11 0700 In: 660 [P.O.:360; IV Piggyback:300] Out: 2000 [Urine:2000] Intake/Output this shift: Total I/O In: 440 [P.O.:240; IV Piggyback:200] Out: 200 [Urine:200]   Lab Results: Recent Labs    04/09/22 0139 04/10/22 0305 04/11/22 0437  WBC 6.6 5.3 5.5  HGB 9.9* 9.8* 10.3*  PLT 159 156 172  MCV 94.5 93.8 93.2   BMET Recent Labs    04/09/22 0139 04/10/22 0305 04/11/22 0437  NA 139 143 145  K 3.7 3.6 3.8  CL 107 112* 106  CO2 25 23 26   GLUCOSE 97 86 94  BUN 14 7* 7*  CREATININE 0.75 0.72 0.77  CALCIUM 8.1* 8.2* 9.0   LFT No results for input(s): "PROT", "ALBUMIN", "AST", "ALT", "ALKPHOS", "BILITOT", "BILIDIR", "IBILI" in the last 72 hours. PT/INR No results for input(s): "INR" in the last 72 hours.    Imaging/Other results: No results found.    Assessment and Plan:  1) Generalized abdominal pain 2) Constipation-resolved 3) Diarrhea 4) Segmental colitis and enteritis on CT  - Continue scheduled Bentyl - Continue slowly advancing diet as tolerated - Stop scheduled MiraLAX p; can change torn.  Of note we are NOT trying to slowly bowel  prep this patient as suggested in some of the notes and apparently had been relayed to the patient.  Plan is to titrate MiraLAX, etc. to soft stools without straining to have BM - Discussed trying to get CT enterography done today, but patient does not feel like she can tolerate contrast.  Will revisit whether or not this needs to be done tomorrow - Patient again indicates today that she does not want colonoscopy unless absolutely necessary - GI service will continue to follow    , DO  04/11/2022, 5:22 PM [facility] Pager [phone]

## 2022-04-12 ENCOUNTER — Inpatient Hospital Stay (HOSPITAL_COMMUNITY): Payer: Medicare Other

## 2022-04-12 DIAGNOSIS — R1084 Generalized abdominal pain: Secondary | ICD-10-CM | POA: Diagnosis not present

## 2022-04-12 DIAGNOSIS — K529 Noninfective gastroenteritis and colitis, unspecified: Secondary | ICD-10-CM | POA: Diagnosis not present

## 2022-04-12 LAB — BASIC METABOLIC PANEL
Anion gap: 10 (ref 5–15)
BUN: 6 mg/dL — ABNORMAL LOW (ref 8–23)
CO2: 23 mmol/L (ref 22–32)
Calcium: 9 mg/dL (ref 8.9–10.3)
Chloride: 108 mmol/L (ref 98–111)
Creatinine, Ser: 0.79 mg/dL (ref 0.44–1.00)
GFR, Estimated: >60 mL/min (ref >60)
Glucose, Bld: 88 mg/dL (ref 70–99)
Potassium: 3.3 mmol/L — ABNORMAL LOW (ref 3.5–5.1)
Sodium: 141 mmol/L (ref 135–145)

## 2022-04-12 LAB — CBC
HCT: 31.7 % — ABNORMAL LOW (ref 36.0–46.0)
Hemoglobin: 10.4 g/dL — ABNORMAL LOW (ref 12.0–15.0)
MCH: 30.6 pg (ref 26.0–34.0)
MCHC: 32.8 g/dL (ref 30.0–36.0)
MCV: 93.2 fL (ref 80.0–100.0)
Platelets: 193 10*3/uL (ref 150–400)
RBC: 3.4 MIL/uL — ABNORMAL LOW (ref 3.87–5.11)
RDW: 13.1 % (ref 11.5–15.5)
WBC: 6.3 10*3/uL (ref 4.0–10.5)
nRBC: 0 % (ref 0.0–0.2)

## 2022-04-12 MED ORDER — POTASSIUM CHLORIDE 10 MEQ/100ML IV SOLN
10.0000 meq | INTRAVENOUS | Status: AC
  Administered 2022-04-12 (×3): 10 meq via INTRAVENOUS
  Filled 2022-04-12 (×3): qty 100

## 2022-04-12 MED ORDER — BARIUM SULFATE 0.1 % PO SUSP
ORAL | Status: AC
  Filled 2022-04-12: qty 3

## 2022-04-12 MED ORDER — POTASSIUM CHLORIDE 10 MEQ/100ML IV SOLN
10.0000 meq | INTRAVENOUS | Status: AC
  Administered 2022-04-12: 10 meq via INTRAVENOUS
  Filled 2022-04-12: qty 100

## 2022-04-12 MED ORDER — METOCLOPRAMIDE HCL 5 MG/ML IJ SOLN
10.0000 mg | Freq: Once | INTRAMUSCULAR | Status: AC
  Administered 2022-04-12: 10 mg via INTRAVENOUS
  Filled 2022-04-12: qty 2

## 2022-04-12 MED ORDER — IOHEXOL 300 MG/ML  SOLN
100.0000 mL | Freq: Once | INTRAMUSCULAR | Status: AC | PRN
  Administered 2022-04-12: 100 mL via INTRAVENOUS

## 2022-04-12 NOTE — Progress Notes (Signed)
PROGRESS NOTE    Dominique Livingston  YBF:[id number]  DOB: [birth year]  DOA: 04/08/2022 PCP: Elby Showers, MD Outpatient Specialists:   Hospital course:  88 year old female with HTN/HL, chronic constipation, s/p partial colectomy with enterocolonic anastomoses was admitted 04/08/2022 with abdominal pain nausea and vomiting.  CT abdomen pelvis showed mild wall thickening and enhancement involving ascending colon just distal to the anastomosis concerning for acute segmental colitis versus inflammatory enteritis.  Patient with significant pain after eating and after admission patient has despite elevated watery diarrhea.  Further work-up including ESR and CRP were negative.  Subjective:  Patient feels that she has less diarrhea since this morning.  Still having loose stools but not as much as previously.  Abdominal pain also seems to be somewhat improved.  No further nausea or vomiting.  Is tolerating clear liquids well.  Objective: Vitals:   04/11/22 1035 04/11/22 2058 04/12/22 0340 04/12/22 1219  BP: 139/71 (!) 154/58 115/68 (!) 143/57  Pulse: 62 62 60   Resp: '18 18 18   ' Temp: (!) 97.5 F (36.4 C) 98.2 F (36.8 C) 98.1 F (36.7 C) 97.9 F (36.6 C)  TempSrc: Oral Oral Oral Oral  SpO2: 95% 96% 96% 99%  Weight:   58.8 kg   Height:        Intake/Output Summary (Last 24 hours) at 04/12/2022 1521 Last data filed at 04/12/2022 1500 Gross per 24 hour  Intake 788.98 ml  Output 1050 ml  Net -261.02 ml    Filed Weights   04/10/22 0406 04/11/22 0300 04/12/22 0340  Weight: 60.6 kg 60.2 kg 58.8 kg     Exam:  General: Reasonably well-appearing patient lying in bed in NAD with hands on abdomen. Eyes: sclera anicteric, conjuctiva mild injection bilaterally CVS: S1-S2, regular  Respiratory:  decreased air entry bilaterally secondary to decreased inspiratory effort, rales at bases  GI: Slightly distended, tympanitic, minimal to no tenderness to light and moderate palpation with  distraction, no rebound tenderness LE: No edema.  Neuro: A/O x 3, grossly nonfocal.  Psych: patient is logical and coherent, judgement and insight appear normal, mood and affect appropriate to situation.   Assessment & Plan:   GI distress with diarrhea Appreciate GI consultation, CT enterography is ordered and pending Continue Flagyl and ceftriaxone day # 4 Initially presented with nausea and vomiting with concern for obstruction CT with ascending colon wall thickening and enhancement concerning for inflammation ESR/CRP are WNL, WBC normalized the day after admission with hydration and antibiotics On Bentyl per GI, patient has alternating IBS diarrhea/constipation at baseline Patient wants to avoid colonoscopy if at all possible Continue liquid diet  Hypokalemia Patient did not seem to absorb oral potassium given yesterday, will try IV repletion today  HTN Home lisinopril on hold, can restart once BP rebounds  Hypothyroidism Continue Synthroid per home doses   DVT prophylaxis: Lovenox Code Status: Full Family Communication: None today Disposition Plan:   Patient is from: Home  Anticipated Discharge Location: Home  Barriers to Discharge: Ongoing diarrhea and abdominal pain  Is patient medically stable for Discharge: Not yet   Scheduled Meds:  barium       dicyclomine  10 mg Oral TID AC & HS   enoxaparin (LOVENOX) injection  40 mg Subcutaneous Q24H   levothyroxine  75 mcg Oral Q0600   loratadine  10 mg Oral Daily   ondansetron (ZOFRAN) IV  4 mg Intravenous Once   sodium chloride flush  3 mL Intravenous Q12H  Continuous Infusions:  cefTRIAXone (ROCEPHIN)  IV 2 g (04/12/22 6606)   And   metronidazole 500 mg (04/12/22 1020)    Data Reviewed:  Basic Metabolic Panel: Recent Labs  Lab 04/08/22 0322 04/09/22 0139 04/10/22 0305 04/11/22 0437 04/12/22 0041  NA 140 139 143 145 141  K 4.3 3.7 3.6 3.8 3.3*  CL 100 107 112* 106 108  CO2 '26 25 23 26 23  ' GLUCOSE  176* 97 86 94 88  BUN 22 14 7* 7* 6*  CREATININE 1.01* 0.75 0.72 0.77 0.79  CALCIUM 10.6* 8.1* 8.2* 9.0 9.0  MG  --   --  2.0  --   --      CBC: Recent Labs  Lab 04/08/22 0322 04/09/22 0139 04/10/22 0305 04/11/22 0437 04/12/22 0041  WBC 12.2* 6.6 5.3 5.5 6.3  HGB 13.0 9.9* 9.8* 10.3* 10.4*  HCT 40.8 30.8* 30.3* 31.3* 31.7*  MCV 94.2 94.5 93.8 93.2 93.2  PLT 231 159 156 172 193     Studies: CT ENTERO ABD/PELVIS W CONTAST  Result Date: 04/12/2022 CLINICAL DATA:  Abdominal pain. Nausea. Dilated bowel loops abdominal radiograph. EXAM: CT ABDOMEN AND PELVIS WITH CONTRAST (ENTEROGRAPHY) TECHNIQUE: Multidetector CT of the abdomen and pelvis during bolus administration of intravenous contrast. Negative oral contrast was given. RADIATION DOSE REDUCTION: This exam was performed according to the departmental dose-optimization program which includes automated exposure control, adjustment of the mA and/or kV according to patient size and/or use of iterative reconstruction technique. CONTRAST:  163m OMNIPAQUE IOHEXOL 300 MG/ML  SOLN COMPARISON:  04/08/2022 FINDINGS: Lower Chest: No acute findings. Hepatobiliary: No hepatic masses identified. Prior cholecystectomy. No evidence of biliary obstruction. Pancreas:  No mass or inflammatory changes. Spleen: Within normal limits in size and appearance. Adrenals/Urinary Tract: No masses identified. A few benign-appearing right renal cysts remain stable (no followup imaging recommended). No evidence of ureteral calculi or hydronephrosis. Stomach/Bowel: Air-fluid levels are seen throughout the stomach, small bowel, and colon mild diffuse small bowel and colonic dilatation. No evidence of transition point. This is likely due to an ileus. No evidence of bowel obstruction, inflammatory process or abnormal fluid collections. Vascular/Lymphatic: No pathologically enlarged lymph nodes. No acute vascular findings. Aortic atherosclerotic calcification incidentally noted.  Reproductive: Prior hysterectomy noted. Adnexal regions are unremarkable in appearance. Other:  None. Musculoskeletal:  No suspicious bone lesions identified. IMPRESSION: Diffuse dilatation and air-fluid levels throughout the stomach, small bowel, and colon, likely due to ileus. No signs of bowel obstruction or other acute findings. Aortic Atherosclerosis (ICD10-I70.0). Electronically Signed   By: JMarlaine HindM.D.   On: 04/12/2022 15:17    Principal Problem:   Colitis Active Problems:   AKI ([place] kidney injury) ([place]   Essential hypertension   Hyperlipidemia   Generalized abdominal pain   Diarrhea     Shawntrice Salle TDerek Jack [facility]  If 7PM-7AM, please contact night-coverage [email or web address]   LOS: 4 days

## 2022-04-12 NOTE — Progress Notes (Signed)
Mobility Specialist Progress Note:   04/12/22 1143  Mobility  Activity Ambulated with assistance in hallway  Level of Assistance Independent after set-up  Assistive Device None  Distance Ambulated (ft) 550 ft  Activity Response Tolerated well  $Mobility charge 1 Mobility   Pt received in bed willing to participate in mobility. No complaints of pain. Left EOB with call bell in reach and all needs met.   [facility] Dianara Smullen Mobility Specialist

## 2022-04-12 NOTE — Progress Notes (Addendum)
Attending physician's note   I have taken a history, reviewed the chart, and examined the patient. I performed a substantive portion of this encounter, including complete performance of at least one of the key components, in conjunction with the APP. I agree with the APP's note, impression, and recommendations with my edits.   We will follow-up results of CT enterography for resolution of previously noted enteritis along with evaluation for obstruction, stenosis, colitis.  Abdominal pain was otherwise improving prior to contrast for CT.  Diarrhea has improved significantly since stopping MiraLAX. - MiraLAX prn only - Continue Bentyl - We will follow-up on CT enterography results - Continue advancing diet as tolerated  Aden Sek, DO, FACG [phone] office                Daily Rounding Note  04/12/2022, 11:45 AM  LOS: 4 days   SUBJECTIVE:   Chief complaint:      Unable to undergo the CT enterography as nausea prevented consuming the large amount oral contrast needed for study.   Today diarrhea much better. C/O some pp pain/discomfort after consuming clears. Has not required any pain meds for > 3days.   OBJECTIVE:         Vital signs in last 24 hours:    Temp:  [98.1 F (36.7 C)-98.2 F (36.8 C)] 98.1 F (36.7 C) (07/12 0340) Pulse Rate:  [60-62] 60 (07/12 0340) Resp:  [18] 18 (07/12 0340) BP: (115-154)/(58-68) 115/68 (07/12 0340) SpO2:  [96 %] 96 % (07/12 0340) Weight:  [58.8 kg] 58.8 kg (07/12 0340) Last BM Date : 04/11/22 Filed Weights   04/10/22 0406 04/11/22 0300 04/12/22 0340  Weight: 60.6 kg 60.2 kg 58.8 kg   General: NAD, looks well   Heart: RRR Chest: clear w good BS Abdomen: soft, minor vague upper abd, central discomfort.  ND.  Active BS  Extremities: no CCE Neuro/Psych:  oriented x 3.  Alert.    Intake/Output from previous day: 07/11 0701 - 07/12 0700 In: 923 [P.O.:720; I.V.:3; IV  Piggyback:200] Out: 600 [Urine:600]  Intake/Output this shift: Total I/O In: 100 [P.O.:100] Out: 350 [Urine:350]  Lab Results: Recent Labs    04/10/22 0305 04/11/22 0437 04/12/22 0041  WBC 5.3 5.5 6.3  HGB 9.8* 10.3* 10.4*  HCT 30.3* 31.3* 31.7*  PLT 156 172 193   BMET Recent Labs    04/10/22 0305 04/11/22 0437 04/12/22 0041  NA 143 145 141  K 3.6 3.8 3.3*  CL 112* 106 108  CO2 23 26 23   GLUCOSE 86 94 88  BUN 7* 7* 6*  CREATININE 0.72 0.77 0.79  CALCIUM 8.2* 9.0 9.0   LFT No results for input(s): "PROT", "ALBUMIN", "AST", "ALT", "ALKPHOS", "BILITOT", "BILIDIR", "IBILI" in the last 72 hours. PT/INR No results for input(s): "LABPROT", "INR" in the last 72 hours. Hepatitis Panel No results for input(s): "HEPBSAG", "HCVAB", "HEPAIGM", "HEPBIGM" in the last 72 hours.  Studies/Results: No results found.  Scheduled Meds:  dicyclomine  10 mg Oral TID AC & HS   enoxaparin (LOVENOX) injection  40 mg Subcutaneous Q24H   levothyroxine  75 mcg Oral Q0600   loratadine  10 mg Oral Daily   ondansetron (ZOFRAN) IV  4 mg Intravenous Once   sodium chloride flush  3 mL Intravenous Q12H   Continuous Infusions:  cefTRIAXone (ROCEPHIN)  IV 2 g (04/12/22 0655)   And   metronidazole 500 mg (04/12/22 1020)   potassium chloride 10 mEq (04/12/22 1130)  PRN Meds:.acetaminophen **OR** acetaminophen, albuterol, ALPRAZolam, morphine injection, ondansetron **OR** ondansetron (ZOFRAN) IV, mouth rinse   ASSESMENT:     Abdominal pain, nausea.  CT concerning for ascending colitis and narrowing in SB, ? Enteritis.  Day 6 Rocephin.  Day  4 Flagyl.      Diarrhea in setting of laxatives for earlier constipation.  IBS alternating at baseline.      Hypokalemia.     Stable [place] anemia.      PLAN     CT  enterography.  Give a dose of IV reglan when she starts drinking contrast, d/w RN.      Dominique Livingston  04/12/2022, 11:45 AM Phone [phone]

## 2022-04-12 NOTE — Plan of Care (Signed)
  Problem: Health Behavior/Discharge Planning: Goal: Ability to manage health-related needs will improve Outcome: Progressing   Problem: Clinical Measurements: Goal: Ability to maintain clinical measurements within normal limits will improve Outcome: Progressing   Problem: Activity: Goal: Risk for activity intolerance will decrease Outcome: Progressing   Problem: Nutrition: Goal: Adequate nutrition will be maintained Outcome: Progressing   

## 2022-04-13 DIAGNOSIS — R197 Diarrhea, unspecified: Secondary | ICD-10-CM | POA: Diagnosis not present

## 2022-04-13 DIAGNOSIS — K529 Noninfective gastroenteritis and colitis, unspecified: Secondary | ICD-10-CM | POA: Diagnosis not present

## 2022-04-13 DIAGNOSIS — R1084 Generalized abdominal pain: Secondary | ICD-10-CM | POA: Diagnosis not present

## 2022-04-13 LAB — BASIC METABOLIC PANEL
Anion gap: 12 (ref 5–15)
BUN: 6 mg/dL — ABNORMAL LOW (ref 8–23)
CO2: 20 mmol/L — ABNORMAL LOW (ref 22–32)
Calcium: 9.1 mg/dL (ref 8.9–10.3)
Chloride: 107 mmol/L (ref 98–111)
Creatinine, Ser: 0.8 mg/dL (ref 0.44–1.00)
GFR, Estimated: >60 mL/min (ref >60)
Glucose, Bld: 75 mg/dL (ref 70–99)
Potassium: 4.1 mmol/L (ref 3.5–5.1)
Sodium: 139 mmol/L (ref 135–145)

## 2022-04-13 LAB — CBC
HCT: 33.1 % — ABNORMAL LOW (ref 36.0–46.0)
Hemoglobin: 11 g/dL — ABNORMAL LOW (ref 12.0–15.0)
MCH: 30.7 pg (ref 26.0–34.0)
MCHC: 33.2 g/dL (ref 30.0–36.0)
MCV: 92.5 fL (ref 80.0–100.0)
Platelets: 203 10*3/uL (ref 150–400)
RBC: 3.58 MIL/uL — ABNORMAL LOW (ref 3.87–5.11)
RDW: 12.9 % (ref 11.5–15.5)
WBC: 7 10*3/uL (ref 4.0–10.5)
nRBC: 0 % (ref 0.0–0.2)

## 2022-04-13 LAB — MAGNESIUM: Magnesium: 2.1 mg/dL (ref 1.7–2.4)

## 2022-04-13 NOTE — Progress Notes (Signed)
Mobility Specialist Progress Note:   04/13/22 1024  Mobility  Activity Ambulated with assistance in hallway  Level of Assistance Independent after set-up  Assistive Device None  Distance Ambulated (ft) 550 ft  Activity Response Tolerated well  $Mobility charge 1 Mobility   Pt received EOB willing to participate in mobility. No complaints of pain. Left in bed with call bell in reach and all needs met.      Mobility Specialist  

## 2022-04-13 NOTE — Progress Notes (Addendum)
Attending physician's note   I have taken a history, reviewed the chart, and examined the patient. I performed a substantive portion of this encounter, including complete performance of at least one of the key components, in conjunction with the APP. I agree with the APP's note, impression, and recommendations with my edits.   Reviewed CT results with patient and daughter at bedside today.  Imaging looks much better compared with CT earlier on this admission.  Mild diffuse small bowel/colonic dilatation without transition zone, obstruction, and no areas of inflammation.  There was radiographic suspicion for ileus, but she clinically does not have an ileus.  Patient hopeful for discharge home soon.  Plan for medication adjustment as outlined below along with follow-up in the GI clinic.  Inpatient GI team will sign off at this time.  Please do not hesitate to contact us with additional questions or concerns.  Ashlye Oviedo, DO, FACG [phone] office          Progress Note  Primary GI: Dr. Lavon Paganini   Subjective  Chief Complaint: Nausea, abdominal pain abnormal CT  Patient states she was up all night very anxious about CT enterography findings. Daughter at bedside. Patient reassured that CT finding showed no inflammation, with bowel movements likely not an ileus. Patient is interested in food this morning, no nausea or vomiting.  Has only been on liquid diet. Has had loose bowel movements through the night small-volume. Denies hematochezia or melena.    Objective   Vital signs in last 24 hours: Temp:  [97.9 F (36.6 C)] 97.9 F (36.6 C) (07/13 0553) Pulse Rate:  [67] 67 (07/12 1921) Resp:  [18] 18 (07/13 0557) BP: (137-154)/(57-64) 137/63 (07/13 0557) SpO2:  [97 %-99 %] 97 % (07/13 0557) Weight:  [57.7 kg] 57.7 kg (07/13 0553) Last BM Date : 04/13/22 Last BM recorded by nurses in past 5 days No data recorded  General:   female in no acute distress  Heart:   Regular rate and rhythm; no murmurs Pulm: Clear anteriorly; no wheezing Abdomen:  Soft, Non-distended AB, Active bowel sounds. mild tenderness in the entire abdomen. Without guarding and Without rebound, No organomegaly appreciated. Extremities:  without  edema. Neurologic:  Alert and  oriented x4;  No focal deficits.  Psych:  Cooperative. Normal mood and affect.  Intake/Output from previous day: 07/12 0701 - 07/13 0700 In: 1089.7 [P.O.:580; IV Piggyback:509.7] Out: 1651 [Urine:1650; Stool:1] Intake/Output this shift: Total I/O In: 350 [P.O.:350] Out: -   Studies/Results: CT ENTERO ABD/PELVIS W CONTAST  Result Date: 04/12/2022 CLINICAL DATA:  Abdominal pain. Nausea. Dilated bowel loops abdominal radiograph. EXAM: CT ABDOMEN AND PELVIS WITH CONTRAST (ENTEROGRAPHY) TECHNIQUE: Multidetector CT of the abdomen and pelvis during bolus administration of intravenous contrast. Negative oral contrast was given. RADIATION DOSE REDUCTION: This exam was performed according to the departmental dose-optimization program which includes automated exposure control, adjustment of the mA and/or kV according to patient size and/or use of iterative reconstruction technique. CONTRAST:  OMNIPAQUE IOHEXOL 300 MG/ML  SOLN COMPARISON:  04/08/2022 FINDINGS: Lower Chest: No acute findings. Hepatobiliary: No hepatic masses identified. Prior cholecystectomy. No evidence of biliary obstruction. Pancreas:  No mass or inflammatory changes. Spleen: Within normal limits in size and appearance. Adrenals/Urinary Tract: No masses identified. A few benign-appearing right renal cysts remain stable (no followup imaging recommended). No evidence of ureteral calculi or hydronephrosis. Stomach/Bowel: Air-fluid levels are seen throughout the stomach, small bowel, and colon mild diffuse small bowel and colonic dilatation.  No evidence of transition point. This is likely due to an ileus. No evidence of bowel obstruction, inflammatory  process or abnormal fluid collections. Vascular/Lymphatic: No pathologically enlarged lymph nodes. No acute vascular findings. Aortic atherosclerotic calcification incidentally noted. Reproductive: Prior hysterectomy noted. Adnexal regions are unremarkable in appearance. Other:  None. Musculoskeletal:  No suspicious bone lesions identified. IMPRESSION: Diffuse dilatation and air-fluid levels throughout the stomach, small bowel, and colon, likely due to ileus. No signs of bowel obstruction or other acute findings. Aortic Atherosclerosis (ICD10-I70.0). Electronically Signed   By: Danae Orleans M.D.   On: 04/12/2022 15:17    Lab Results: Recent Labs    04/11/22 0437 04/12/22 0041 04/13/22 0254  WBC 5.5 6.3 7.0  HGB 10.3* 10.4* 11.0*  HCT 31.3* 31.7* 33.1*  PLT 172 193 203   BMET Recent Labs    04/11/22 0437 04/12/22 0041 04/13/22 0254  NA 145 141 139  K 3.8 3.3* 4.1  CL 106 108 107  CO2 26 23 20*  GLUCOSE 94 88 75  BUN 7* 6* 6*  CREATININE 0.77 0.79 0.80  CALCIUM 9.0 9.0 9.1   LFT No results for input(s): "PROT", "ALBUMIN", "AST", "ALT", "ALKPHOS", "BILITOT", "BILIDIR", "IBILI" in the last 72 hours. PT/INR No results for input(s): "LABPROT", "INR" in the last 72 hours.    Patient profile:   Patient admitted with abdominal pain, nausea, CT concerning for ascending colitis and narrowing in small bowel patient day 7 Rocephin day 5 of Flagyl.  Has baseline alternating IBS.   Impression/Plan:   Abdominal pain nausea CT enterography with diffuse dilatation and air-fluid levels throughout the stomach, small bowel and colon possibly thought due to ileus however patient is having bowel movements soft abdomen. No signs of bowel obstruction or acute findings. Patient is wanting to eat today, will start on soft diet and advanced as tolerated Up and out of bed as tolerated  Diarrhea Patient appears to have IBS at baseline Was given Reglan, MiraLAX stopped. Patient also admits to  increasing anxiety, may benefit from SSRI or long-term antianxiety to help with bowels as well outpatient. Continue dicyclomine as needed Also may be component of pelvic floor dysfunction as patient also has some urinary incontinence discussed with patient. Can follow-up outpatient with our clinic  Hypokalemia Corrected today, magnesium normal     LOS: 5 days   Doree Albee  04/13/2022, 11:11 AM

## 2022-04-13 NOTE — Progress Notes (Signed)
PROGRESS NOTE    Dominique Livingston  OJJ:[id number]  DOB: 27-Nov-1932  DOA: 04/08/2022 PCP: Elby Showers, MD Outpatient Specialists:   Hospital course:  88 year old female with HTN/HL, chronic constipation, s/p partial colectomy with enterocolonic anastomoses was admitted 04/08/2022 with abdominal pain nausea and vomiting.  CT abdomen pelvis showed mild wall thickening and enhancement involving ascending colon just distal to the anastomosis concerning for acute segmental colitis versus inflammatory enteritis.  Patient with significant pain after eating and after admission patient has despite elevated watery diarrhea.  Further work-up including ESR and CRP were negative.  Subjective:  Patient states stool volume has decreased significantly although still having liquid stools.  Per daughter at bedside patient has alternating constipation and diarrhea at home.  Decreased abdominal pain as well.  Objective: Vitals:   04/12/22 1921 04/13/22 0553 04/13/22 0557 04/13/22 1126  BP: (!) 154/64  137/63 (!) 146/60  Pulse: 67   65  Resp: _0 Temp: 97.9 F (36.6 C) 97.9 F (36.6 C)  97.9 F (36.6 C)  TempSrc: Oral Oral  Oral  SpO2: 98%  97% 96%  Weight:  57.7 kg    Height:        Intake/Output Summary (Last 24 hours) at 04/13/2022 1734 Last data filed at 04/13/2022 1625 Gross per 24 hour  Intake 1219.39 ml  Output 1701 ml  Net -481.61 ml    Filed Weights   04/11/22 0300 04/12/22 0340 04/13/22 0553  Weight: 60.2 kg 58.8 kg 57.7 kg     Exam:  General: Reasonably well-appearing patient lying in bed in NAD with hands on abdomen. Eyes: sclera anicteric, conjuctiva mild injection bilaterally CVS: S1-S2, regular  Respiratory:  decreased air entry bilaterally secondary to decreased inspiratory effort, rales at bases  GI: Positive bowel sounds of normal tone and pitch, abdomen is soft with less distention than yesterday, nontender with distraction, no rebound tenderness. LE: No  edema.  Neuro: A/O x 3, grossly nonfocal.  Psych: patient is logical and coherent, judgement and insight appear normal, mood and affect appropriate to situation.   Assessment & Plan:   GI distress with diarrhea Appreciate GI consultation, CT enterography is without evidence of inflammation  Some consideration of ileus although clinically patient does not have an ileus Advance diet per GI Continue Flagyl and ceftriaxone day # 5, would discontinue tomorrow Initially presented with nausea and vomiting with concern for obstruction ESR/CRP are WNL, WBC normalized the day after admission with hydration and antibiotics On Bentyl per GI, patient has alternating IBS diarrhea/constipation at baseline GI has signed off, patient can possibly discharge if she tolerates bland diet today without further diarrhea or vomiting.  Hypokalemia Normalized with IV repletion today  HTN Home lisinopril on hold, can restart once BP rebounds  Hypothyroidism Continue Synthroid per home doses   DVT prophylaxis: Lovenox Code Status: Full Family Communication: None today Disposition Plan:   Patient is from: Home  Anticipated Discharge Location: Home  Barriers to Discharge: Ongoing diarrhea and abdominal pain  Is patient medically stable for Discharge: Not yet   Scheduled Meds:  dicyclomine  10 mg Oral TID AC & HS   enoxaparin (LOVENOX) injection  40 mg Subcutaneous Q24H   levothyroxine  75 mcg Oral Q0600   loratadine  10 mg Oral Daily   ondansetron (ZOFRAN) IV  4 mg Intravenous Once   sodium chloride flush  3 mL Intravenous Q12H   Continuous Infusions:  cefTRIAXone (ROCEPHIN)  IV 2 g (04/13/22  2297)   And   metronidazole 500 mg (04/13/22 0849)    Data Reviewed:  Basic Metabolic Panel: Recent Labs  Lab 04/09/22 0139 04/10/22 0305 04/11/22 0437 04/12/22 0041 04/13/22 0254  NA 139 143 145 141 139  K 3.7 3.6 3.8 3.3* 4.1  CL 107 112* 106 108 107  CO2 _0 20*  GLUCOSE 97 86 94 88  75  BUN 14 7* 7* 6* 6*  CREATININE 0.75 0.72 0.77 0.79 0.80  CALCIUM 8.1* 8.2* 9.0 9.0 9.1  MG  --  2.0  --   --  2.1     CBC: Recent Labs  Lab 04/09/22 0139 04/10/22 0305 04/11/22 0437 04/12/22 0041 04/13/22 0254  WBC 6.6 5.3 5.5 6.3 7.0  HGB 9.9* 9.8* 10.3* 10.4* 11.0*  HCT 30.8* 30.3* 31.3* 31.7* 33.1*  MCV 94.5 93.8 93.2 93.2 92.5  PLT 159 156 172 193 203     Studies: CT ENTERO ABD/PELVIS W CONTAST  Result Date: 04/12/2022 CLINICAL DATA:  Abdominal pain. Nausea. Dilated bowel loops abdominal radiograph. EXAM: CT ABDOMEN AND PELVIS WITH CONTRAST (ENTEROGRAPHY) TECHNIQUE: Multidetector CT of the abdomen and pelvis during bolus administration of intravenous contrast. Negative oral contrast was given. RADIATION DOSE REDUCTION: This exam was performed according to the departmental dose-optimization program which includes automated exposure control, adjustment of the mA and/or kV according to patient size and/or use of iterative reconstruction technique. CONTRAST:  163m OMNIPAQUE IOHEXOL 300 MG/ML  SOLN COMPARISON:  04/08/2022 FINDINGS: Lower Chest: No acute findings. Hepatobiliary: No hepatic masses identified. Prior cholecystectomy. No evidence of biliary obstruction. Pancreas:  No mass or inflammatory changes. Spleen: Within normal limits in size and appearance. Adrenals/Urinary Tract: No masses identified. A few benign-appearing right renal cysts remain stable (no followup imaging recommended). No evidence of ureteral calculi or hydronephrosis. Stomach/Bowel: Air-fluid levels are seen throughout the stomach, small bowel, and colon mild diffuse small bowel and colonic dilatation. No evidence of transition point. This is likely due to an ileus. No evidence of bowel obstruction, inflammatory process or abnormal fluid collections. Vascular/Lymphatic: No pathologically enlarged lymph nodes. No acute vascular findings. Aortic atherosclerotic calcification incidentally noted. Reproductive:  Prior hysterectomy noted. Adnexal regions are unremarkable in appearance. Other:  None. Musculoskeletal:  No suspicious bone lesions identified. IMPRESSION: Diffuse dilatation and air-fluid levels throughout the stomach, small bowel, and colon, likely due to ileus. No signs of bowel obstruction or other acute findings. Aortic Atherosclerosis (ICD10-I70.0). Electronically Signed   By: JMarlaine HindM.D.   On: 04/12/2022 15:17    Principal Problem:   Colitis Active Problems:   AKI (acute kidney injury) ([place]   Essential hypertension   Hyperlipidemia   Generalized abdominal pain   Diarrhea     Okechukwu Regnier TDerek Jack [facility]  If 7PM-7AM, please contact night-coverage [email or web address]   LOS: 5 days

## 2022-04-13 NOTE — Progress Notes (Signed)
Mobility Specialist Progress Note:   04/13/22 1000  Mobility  Activity Ambulated independently to bathroom  Level of Assistance Independent  Assistive Device None  Distance Ambulated (ft) 30 ft  Activity Response Tolerated well  $Mobility charge 1 Mobility   Pt ambulating to bathroom independently. No complaints of pain.   [facility] Gerrald Basu Mobility Specialist

## 2022-04-14 DIAGNOSIS — E785 Hyperlipidemia, unspecified: Secondary | ICD-10-CM

## 2022-04-14 LAB — BASIC METABOLIC PANEL
Anion gap: 12 (ref 5–15)
BUN: 13 mg/dL (ref 8–23)
CO2: 21 mmol/L — ABNORMAL LOW (ref 22–32)
Calcium: 8.9 mg/dL (ref 8.9–10.3)
Chloride: 107 mmol/L (ref 98–111)
Creatinine, Ser: 0.82 mg/dL (ref 0.44–1.00)
GFR, Estimated: >60 mL/min (ref >60)
Glucose, Bld: 89 mg/dL (ref 70–99)
Potassium: 3.7 mmol/L (ref 3.5–5.1)
Sodium: 140 mmol/L (ref 135–145)

## 2022-04-14 LAB — CBC
HCT: 33.6 % — ABNORMAL LOW (ref 36.0–46.0)
Hemoglobin: 11 g/dL — ABNORMAL LOW (ref 12.0–15.0)
MCH: 30.6 pg (ref 26.0–34.0)
MCHC: 32.7 g/dL (ref 30.0–36.0)
MCV: 93.6 fL (ref 80.0–100.0)
Platelets: 210 10*3/uL (ref 150–400)
RBC: 3.59 MIL/uL — ABNORMAL LOW (ref 3.87–5.11)
RDW: 13.2 % (ref 11.5–15.5)
WBC: 5.7 10*3/uL (ref 4.0–10.5)
nRBC: 0 % (ref 0.0–0.2)

## 2022-04-14 MED ORDER — METRONIDAZOLE 500 MG PO TABS: 500.0000 mg | ORAL_TABLET | Freq: Three times a day (TID) | ORAL | 0 refills | Status: AC

## 2022-04-14 MED ORDER — ONDANSETRON HCL 4 MG PO TABS: 4.0000 mg | ORAL_TABLET | Freq: Four times a day (QID) | ORAL | 0 refills | Status: AC | PRN

## 2022-04-14 MED ORDER — DICYCLOMINE HCL 10 MG PO CAPS: 10.0000 mg | ORAL_CAPSULE | Freq: Three times a day (TID) | ORAL | 0 refills | Status: DC

## 2022-04-14 MED ORDER — CIPROFLOXACIN HCL 500 MG PO TABS: 500.0000 mg | ORAL_TABLET | Freq: Two times a day (BID) | ORAL | 0 refills | Status: AC

## 2022-04-14 NOTE — Care Management Important Message (Signed)
Important Message  Patient Details  Name: Dominique Livingston MRN: [id number] Date of Birth: June 19, 1933   Medicare Important Message Given:  Yes     Renie Ora 04/14/2022, 9:14 AM

## 2022-04-14 NOTE — TOC Transition Note (Addendum)
Transition of Care [facility]) - CM/SW Discharge Note   Patient Details  Name: Dominique Livingston MRN: [id number] Date of Birth: 15-Sep-1933  [facility] CM/SW Contact:  Leone Haven, RN Phone Number: 04/14/2022, 10:17 AM   Clinical Narrative:    Patient is for dc today, has no needs. She did tolerate her soft diet, will need to see if she will tolerate her lunch.  She has not had a regular BM yet per patient.  Her daughter will be transporting her home.         Patient Goals and CMS Choice        Discharge Placement                       Discharge Plan and Services                                     Social Determinants of Health (SDOH) Interventions     Readmission Risk Interventions     No data to display

## 2022-04-14 NOTE — Discharge Summary (Addendum)
Physician Discharge Summary   Patient: Dominique Livingston MRN: [id number] DOB: 1933-04-16  Admit date:     04/08/2022  Discharge date: 04/14/22  Discharge Physician: Vanna Scotland   PCP: Elby Showers, MD     Discharge Diagnoses: Principal Problem:   Colitis Active Problems:   AKI (acute kidney injury) [facility])   Essential hypertension   Hyperlipidemia   Generalized abdominal pain   Diarrhea  Resolved Problems:   * No resolved hospital problems. [facility] Course: [name] was admitted to the hospital with the working diagnosis of colitis.   88 year old female with HTN/HL, chronic constipation, s/p partial colectomy with enterocolonic anastomoses was admitted 04/08/2022 with abdominal pain nausea and vomiting.  CT abdomen pelvis showed mild wall thickening and enhancement involving ascending colon just distal to the anastomosis concerning for acute segmental colitis versus inflammatory enteritis.  Patient with significant pain after eating and after admission patient has despite elevated watery diarrhea.  Further work-up including ESR and CRP were negative.   Assessment and Plan: #Colitis---resolved #Partial small bowel obstruction--- resolved #IBS, chronic No leukocytosis, tolerating PO well.  Discussed close follow up with GI team Plan to continue with IV fluids isotonic saline at 75 ml per hr Antibiotic therapy with ceftriaxone and metronidazole while inpatient, and to complete 7 days of antibiotic therapy   Follow up with GI re Colitis, IBS, pSBO presentation  AKI (acute kidney injury) ([place]) Hypercalcemia.  Renal function with serum cr at 0,72 K is 3,7 and serum bicarbonate at 25 Ca 8,1 from 10,6   Patient with poor oral intake.   Essential hypertension Continue close blood pressure monitoring.   Hyperlipidemia Continue with statin therapy.          Consultants: GI Procedures performed: n/a  Disposition: Home Diet recommendation:  Discharge Diet Orders (From  admission, onward)     Start     Ordered   04/14/22 0000  Diet - low sodium heart healthy        04/14/22 0812           Low residue diet, bland foods DISCHARGE MEDICATION: Allergies as of 04/14/2022       Reactions   Amoxicillin-pot Clavulanate Nausea Only   Erythromycin Nausea And Vomiting   Mushroom Extract Complex Other (See Comments)   Cantalope, adding enviromental allergens such as greass & trees   Macrobid [nitrofurantoin Monohyd Macro] Rash        Medication List     STOP taking these medications    acetaminophen 500 MG tablet Commonly known as: TYLENOL   colestipol 1 g tablet Commonly known as: Colestid       TAKE these medications    ALPRAZolam 0.25 MG tablet Commonly known as: XANAX Take 1 tablet (0.25 mg total) by mouth 2 (two) times daily as needed. for anxiety What changed:  reasons to take this additional instructions   cetirizine 10 MG tablet Commonly known as: ZYRTEC Chew 10 mg by mouth daily.   ciprofloxacin 500 MG tablet Commonly known as: Cipro Take 1 tablet (500 mg total) by mouth 2 (two) times daily for 4 days.   dicyclomine 10 MG capsule Commonly known as: BENTYL Take 1 capsule (10 mg total) by mouth 4 (four) times daily -  before meals and at bedtime.   ezetimibe 10 MG tablet Commonly known as: ZETIA Take 1 tablet by mouth once daily   levothyroxine 75 MCG tablet Commonly known as: SYNTHROID Take 1 tablet by mouth once daily  lisinopril 5 MG tablet Commonly known as: ZESTRIL Take 1 tablet by mouth once daily   metroNIDAZOLE 500 MG tablet Commonly known as: Flagyl Take 1 tablet (500 mg total) by mouth 3 (three) times daily for 4 days.   multivitamin tablet Take 1 tablet by mouth daily.   ondansetron 4 MG tablet Commonly known as: ZOFRAN Take 1 tablet (4 mg total) by mouth every 6 (six) hours as needed for up to 3 days for nausea.   SYSTANE ULTRA OP Place 1 drop into both eyes 2 (two) times daily.         Follow-up Information     Baxley, Cresenciano Lick, MD. Go on 04/21/2022.   Specialty: Internal Medicine Why: '@12' :00pm Contact information: [address] [address] [phone]         Cirigliano, [place], DO Follow up in 10 day(s).   Specialty: Gastroenterology Contact information: [place] [address] [phone]         Lavena Bullion, DO .   Specialty: Gastroenterology Contact information: [place] [address] [phone]                Discharge Exam: Danley Danker Weights   04/12/22 0340 04/13/22 0553 04/14/22 0425  Weight: 58.8 kg 57.7 kg 57.9 kg    General: Reasonably well-appearing patient lying in bed in NAD with hands on abdomen. Eyes: sclera anicteric, conjuctiva mild injection bilaterally CVS: S1-S2, regular  Respiratory:  decreased air entry bilaterally secondary to decreased inspiratory effort, rales at bases  GI: Positive bowel sounds of normal tone and pitch, abdomen is soft with less distention than yesterday, nontender with distraction, no rebound tenderness. LE: No edema.  Neuro: A/O x 3, grossly nonfocal.  Psych: patient is logical and coherent, judgement and insight appear normal, mood and affect appropriate to situation.  Condition at discharge: good  The results of significant diagnostics from this hospitalization (including imaging, microbiology, ancillary and laboratory) are listed below for reference.   Imaging Studies: CT ENTERO ABD/PELVIS W CONTAST  Result Date: 04/12/2022 CLINICAL DATA:  Abdominal pain. Nausea. Dilated bowel loops abdominal radiograph. EXAM: CT ABDOMEN AND PELVIS WITH CONTRAST (ENTEROGRAPHY) TECHNIQUE: Multidetector CT of the abdomen and pelvis during bolus administration of intravenous contrast. Negative oral contrast was given. RADIATION DOSE REDUCTION: This exam was performed according to the departmental dose-optimization program which includes automated  exposure control, adjustment of the mA and/or kV according to patient size and/or use of iterative reconstruction technique. CONTRAST:  142m OMNIPAQUE IOHEXOL 300 MG/ML  SOLN COMPARISON:  04/08/2022 FINDINGS: Lower Chest: No acute findings. Hepatobiliary: No hepatic masses identified. Prior cholecystectomy. No evidence of biliary obstruction. Pancreas:  No mass or inflammatory changes. Spleen: Within normal limits in size and appearance. Adrenals/Urinary Tract: No masses identified. A few benign-appearing right renal cysts remain stable (no followup imaging recommended). No evidence of ureteral calculi or hydronephrosis. Stomach/Bowel: Air-fluid levels are seen throughout the stomach, small bowel, and colon mild diffuse small bowel and colonic dilatation. No evidence of transition point. This is likely due to an ileus. No evidence of bowel obstruction, inflammatory process or abnormal fluid collections. Vascular/Lymphatic: No pathologically enlarged lymph nodes. No acute vascular findings. Aortic atherosclerotic calcification incidentally noted. Reproductive: Prior hysterectomy noted. Adnexal regions are unremarkable in appearance. Other:  None. Musculoskeletal:  No suspicious bone lesions identified. IMPRESSION: Diffuse dilatation and air-fluid levels throughout the stomach, small bowel, and colon, likely due to ileus. No signs of bowel  obstruction or other acute findings. Aortic Atherosclerosis (ICD10-I70.0). Electronically Signed   By: Marlaine Hind M.D.   On: 04/12/2022 15:17   DG Abd Portable 1V  Result Date: 04/09/2022 CLINICAL DATA:  Nausea. EXAM: PORTABLE ABDOMEN - 1 VIEW COMPARISON:  CT 04/08/2022 FINDINGS: Status post cholecystectomy. The bowel gas pattern appears nonobstructive. Gas and stool noted throughout the colon up to the rectum. A few prominent loops of small bowel are noted in the right lower quadrant which measure up to 2.1 cm. The bowel gas pattern is normal. No radio-opaque calculi or other  significant radiographic abnormality are seen. IMPRESSION: 1. Nonobstructive bowel gas pattern. 2. Mildly prominent loops of small bowel in the right lower quadrant are nonspecific. Electronically Signed   By: Kerby Moors M.D.   On: 04/09/2022 07:08   CT ABDOMEN PELVIS W CONTRAST  Result Date: 04/08/2022 CLINICAL DATA:  Abdominal pain.  Nausea and vomiting EXAM: CT ABDOMEN AND PELVIS WITH CONTRAST TECHNIQUE: Multidetector CT imaging of the abdomen and pelvis was performed using the standard protocol following bolus administration of intravenous contrast. RADIATION DOSE REDUCTION: This exam was performed according to the departmental dose-optimization program which includes automated exposure control, adjustment of the mA and/or kV according to patient size and/or use of iterative reconstruction technique. CONTRAST:  131m OMNIPAQUE IOHEXOL 300 MG/ML  SOLN COMPARISON:  01/17/2018 FINDINGS: Lower chest: No acute abnormality.4 mm nodule identified within the left lower lobe, image 3/5. Unchanged from comparison exam Hepatobiliary: No suspicious liver lesion. Status post cholecystectomy. Chronic increase caliber of the common bile duct measures up to 1.1 cm. No intrahepatic bile duct dilatation identified. Pancreas: Unremarkable. No pancreatic ductal dilatation or surrounding inflammatory changes. Spleen: Normal in size without focal abnormality. Adrenals/Urinary Tract: Normal adrenal glands. Simple appearing kidney cysts identified. No follow-up imaging recommended. No nephrolithiasis or hydronephrosis identified bilaterally. Urinary bladder is unremarkable. Stomach/Bowel: The stomach appears normal. Proximal small bowel loops are unremarkable. The mid and distal small bowel loops are mildly increased in caliber measuring up to 2.7 cm. No signs of high-grade bowel obstruction. There are a few scattered short segment areas of small bowel luminal narrowing, mural enhancement and mild mural thickening. Signs  previous right partial right colectomy with enterocolonic anastomosis identified. The mildly dilated small bowel loops extend up to the anastomosis. There is mild wall thickening and enhancement involving the ascending colon just distal to the anastomosis concerning for acute colitis with possible underlying inflammatory stricture formation. The remaining portions of the colon appear unremarkable. No signs of bowel perforation. Vascular/Lymphatic: Aortic atherosclerosis. No abdominopelvic adenopathy. There is edema/venous congestion identified within the lower abdominal small bowel mesentery, image 58/3. Reproductive: Status post hysterectomy. No adnexal masses. Other: No significant free fluid. No fluid collections. No signs of pneumoperitoneum Musculoskeletal: Spondylosis identified within the lumbar spine. IMPRESSION: 1. Signs of previous right partial colectomy with enterocolonic anastomosis. There is mild wall thickening and enhancement involving the ascending colon just distal to the anastomosis concerning for acute segment colitis with possible underlying inflammatory stricture formation. Correlate for any clinical signs/symptoms of inflammatory bowel disease such as Crohn's disease. 2. There are a scattered short segments of small bowel luminal narrowing, mural enhancement and mild mural thickening. Findings are concerning for inflammatory enteritis. 3. Mildly increased caliber of the mid and distal small bowel loops are identified. Low-grade partial obstruction cannot be excluded. 4. There is edema/venous congestion within the lower abdominal small bowel mesentery. This is a nonspecific finding but can be seen in the setting of  inflammatory bowel disease. 5. 4 mm nodule identified within the left lower lobe. Unchanged from previous exam compatible with a benign abnormality. 6. Aortic Atherosclerosis (ICD10-I70.0). Electronically Signed   By: Kerby Moors M.D.   On: 04/08/2022 06:25     Microbiology: Results for orders placed or performed during the hospital encounter of 04/08/22  Urine Culture     Status: None   Collection Time: 04/08/22  7:38 AM   Specimen: Urine, Clean Catch  Result Value Ref Range Status   Specimen Description URINE, CLEAN CATCH  Final   Special Requests NONE  Final   Culture   Final    NO GROWTH Performed at [facility], [place] [address], [place], [place] [address]    Report Status 04/10/2022 FINAL  Final    Labs: CBC: Recent Labs  Lab 04/10/22 0305 04/11/22 0437 04/12/22 0041 04/13/22 0254 04/14/22 0400  WBC 5.3 5.5 6.3 7.0 5.7  HGB 9.8* 10.3* 10.4* 11.0* 11.0*  HCT 30.3* 31.3* 31.7* 33.1* 33.6*  MCV 93.8 93.2 93.2 92.5 93.6  PLT 156 172 193 203 300   Basic Metabolic Panel: Recent Labs  Lab 04/10/22 0305 04/11/22 0437 04/12/22 0041 04/13/22 0254 04/14/22 0400  NA 143 145 141 139 140  K 3.6 3.8 3.3* 4.1 3.7  CL 112* 106 108 107 107  CO2 '23 26 23 ' 20* 21*  GLUCOSE 86 94 88 75 89  BUN 7* 7* 6* 6* 13  CREATININE 0.72 0.77 0.79 0.80 0.82  CALCIUM 8.2* 9.0 9.0 9.1 8.9  MG 2.0  --   --  2.1  --    Liver Function Tests: Recent Labs  Lab 04/08/22 0322  AST 25  ALT 23  ALKPHOS 56  BILITOT 0.7  PROT 8.0  ALBUMIN 4.5   CBG: No results for input(s): "GLUCAP" in the last 168 hours.  Discharge time spent: less than 30 minutes.  Signed: Vanna Scotland, MD [facility] 04/14/2022

## 2022-04-14 NOTE — Plan of Care (Signed)

## 2022-04-14 NOTE — Progress Notes (Signed)
Mobility Specialist Progress Note:   04/14/22 0904  Mobility  Activity Ambulated with assistance in hallway  Level of Assistance Independent  Assistive Device None  Distance Ambulated (ft) 550 ft  Activity Response Tolerated well  $Mobility charge 1 Mobility   Pt received standing at sink. No complaints of pain. Left EOB with call bell in reach and all needs met.  [facility] Jermario Kalmar Mobility Specialist

## 2022-04-17 ENCOUNTER — Telehealth: Payer: Self-pay | Admitting: *Deleted

## 2022-04-17 ENCOUNTER — Telehealth: Payer: Self-pay

## 2022-04-17 NOTE — Telephone Encounter (Signed)
Transition Care Management Follow-up Telephone Call Date of discharge and from where: 7/14 [facility] How have you been since you were released from the hospital? Doing ok, still has some discomfort but better. Still has a lot of swelling in stomach and some nausea Any questions or concerns? No  Items Reviewed: Did the pt receive and understand the discharge instructions provided? Yes  Medications obtained and verified? Yes  Other?    Any new allergies since your discharge? No  Dietary orders reviewed? Yes Do you have support at home? Yes   Home Care and Equipment/Supplies: Were home health services ordered? not applicable If so, what is the name of the agency?   Has the agency set up a time to come to the patient's home? not applicable Were any new equipment or medical supplies ordered?  No What is the name of the medical supply agency?  Were you able to get the supplies/equipment? not applicable Do you have any questions related to the use of the equipment or supplies? No  Functional Questionnaire: (I = Independent and D = Dependent) ADLs: I  Bathing/Dressing- I  Meal Prep- I  Eating- I  Maintaining continence- I  Transferring/Ambulation- I  Managing Meds- I  Follow up appointments reviewed:  PCP Hospital f/u appt confirmed? Yes  Scheduled to see Dr baxley on 04/21/22  @ 12. No appointment with gastro for FU as of yet. Will discuss with Dr Lenord Fellers at appt.  Are transportation arrangements needed? No  If their condition worsens, is the pt aware to call PCP or go to the Emergency Dept.? Yes Was the patient provided with contact information for the PCP's office or ED? Yes Was to pt encouraged to call back with questions or concerns? Yes

## 2022-04-17 NOTE — Chronic Care Management (AMB) (Signed)
  Care Coordination  Note  04/17/2022 Name: Dominique Livingston MRN: [id number] DOB: 02-Jun-1933  [name] is a 88 y.o. year old female who is a primary care patient of Baxley, Luanna Cole, MD. I reached out to [name] by phone today to offer care coordination services.      Dominique Livingston was given information about Care Coordination services today including:  The Care Coordination services include support from the care team which includes your Nurse Coordinator, Clinical Social Worker, or Pharmacist.  The Care Coordination team is here to help remove barriers to the health concerns and goals most important to you. Care Coordination services are voluntary and the patient may decline or stop services at any time by request to their care team member.   Patient agreed to services and verbal consent obtained.   Follow up plan: Telephone appointment with care coordination team member scheduled for: 04/25/2022  Burman Nieves, [facility] Care Coordination Care Guide Direct Dial: [phone]

## 2022-04-21 ENCOUNTER — Ambulatory Visit
Admission: RE | Admit: 2022-04-21 | Discharge: 2022-04-21 | Disposition: A | Discharge status: Home / Self Care | Payer: Medicare Other | Source: Ambulatory Visit | Attending: Internal Medicine | Admitting: Internal Medicine

## 2022-04-21 ENCOUNTER — Encounter: Payer: Self-pay | Admitting: Gastroenterology

## 2022-04-21 ENCOUNTER — Ambulatory Visit (INDEPENDENT_AMBULATORY_CARE_PROVIDER_SITE_OTHER): Payer: Medicare Other | Admitting: Internal Medicine

## 2022-04-21 ENCOUNTER — Encounter: Payer: Self-pay | Admitting: Internal Medicine

## 2022-04-21 ENCOUNTER — Telehealth: Payer: Self-pay | Admitting: Gastroenterology

## 2022-04-21 VITALS — BP 150/74 | HR 68 | Temp 97.7°F | Wt 128.5 lb

## 2022-04-21 DIAGNOSIS — K529 Noninfective gastroenteritis and colitis, unspecified: Secondary | ICD-10-CM

## 2022-04-21 DIAGNOSIS — E039 Hypothyroidism, unspecified: Secondary | ICD-10-CM | POA: Diagnosis not present

## 2022-04-21 DIAGNOSIS — E782 Mixed hyperlipidemia: Secondary | ICD-10-CM

## 2022-04-21 DIAGNOSIS — R197 Diarrhea, unspecified: Secondary | ICD-10-CM | POA: Diagnosis not present

## 2022-04-21 DIAGNOSIS — F411 Generalized anxiety disorder: Secondary | ICD-10-CM | POA: Diagnosis not present

## 2022-04-21 DIAGNOSIS — Z9049 Acquired absence of other specified parts of digestive tract: Secondary | ICD-10-CM | POA: Diagnosis not present

## 2022-04-21 DIAGNOSIS — Z789 Other specified health status: Secondary | ICD-10-CM

## 2022-04-21 DIAGNOSIS — Z8739 Personal history of other diseases of the musculoskeletal system and connective tissue: Secondary | ICD-10-CM | POA: Diagnosis not present

## 2022-04-21 DIAGNOSIS — I10 Essential (primary) hypertension: Secondary | ICD-10-CM

## 2022-04-21 LAB — COMPLETE METABOLIC PANEL WITH GFR
AG Ratio: 1.6 (calc) (ref 1.0–2.5)
ALT: 39 U/L — ABNORMAL HIGH (ref 6–29)
AST: 30 U/L (ref 10–35)
Albumin: 4.9 g/dL (ref 3.6–5.1)
Alkaline phosphatase (APISO): 57 U/L (ref 37–153)
BUN: 14 mg/dL (ref 7–25)
CO2: 31 mmol/L (ref 20–32)
Calcium: 9.9 mg/dL (ref 8.6–10.4)
Chloride: 102 mmol/L (ref 98–110)
Creat: 0.84 mg/dL (ref 0.60–0.95)
Globulin: 3 g/dL (calc) (ref 1.9–3.7)
Glucose, Bld: 92 mg/dL (ref 65–99)
Potassium: 4.7 mmol/L (ref 3.5–5.3)
Sodium: 142 mmol/L (ref 135–146)
Total Bilirubin: 0.5 mg/dL (ref 0.2–1.2)
Total Protein: 7.9 g/dL (ref 6.1–8.1)
eGFR: 67 mL/min/{1.73_m2} (ref >=60)

## 2022-04-21 LAB — CBC WITH DIFFERENTIAL/PLATELET
Absolute Monocytes: 322 cells/uL (ref 200–950)
Basophils Absolute: 62 cells/uL (ref 0–200)
Basophils Relative: 1 %
Eosinophils Absolute: 167 cells/uL (ref 15–500)
Eosinophils Relative: 2.7 %
HCT: 40.8 % (ref 35.0–45.0)
Hemoglobin: 13.3 g/dL (ref 11.7–15.5)
Lymphs Abs: 1742 cells/uL (ref 850–3900)
MCH: 30.6 pg (ref 27.0–33.0)
MCHC: 32.6 g/dL (ref 32.0–36.0)
MCV: 94 fL (ref 80.0–100.0)
MPV: 8.9 fL (ref 7.5–12.5)
Monocytes Relative: 5.2 %
Neutro Abs: 3906 cells/uL (ref 1500–7800)
Neutrophils Relative %: 63 %
Platelets: 278 10*3/uL (ref 140–400)
RBC: 4.34 10*6/uL (ref 3.80–5.10)
RDW: 12.6 % (ref 11.0–15.0)
Total Lymphocyte: 28.1 %
WBC: 6.2 10*3/uL (ref 3.8–10.8)

## 2022-04-21 LAB — C DIFFICILE QUICK SCREEN W PCR REFLEX
C Diff antigen: NEGATIVE
C Diff interpretation: NOT DETECTED
C Diff toxin: NEGATIVE

## 2022-04-21 MED ORDER — CIPROFLOXACIN HCL 500 MG PO TABS: 500.0000 mg | ORAL_TABLET | Freq: Two times a day (BID) | ORAL | 0 refills | Status: DC

## 2022-04-21 MED ORDER — METRONIDAZOLE 500 MG PO TABS: 500.0000 mg | ORAL_TABLET | Freq: Three times a day (TID) | ORAL | 0 refills | Status: AC

## 2022-04-21 MED ORDER — CIPROFLOXACIN HCL 500 MG PO TABS
500.0000 mg | ORAL_TABLET | Freq: Two times a day (BID) | ORAL | 0 refills | Status: AC
Start: 2022-04-21 — End: 2022-04-28

## 2022-04-21 NOTE — Progress Notes (Signed)
Subjective:    [name], female    DOB: 04/25/1933, 88 y.o.   MRN: [id number]  HPI 88 year old Female seen for hospital follow up.  Was admitted on July 8 through July 14 after presenting to the emergency department with generalized abdominal pain.  Symptoms started fairly suddenly late afternoon the previous day.  Patient complained of sharp constant severe abdominal pain.  She developed nausea and vomiting.  Initially thought she had food poisoning but really only ate a tomato sandwich.  Daughter had called me about this and indicated to her I did not think patient had food poisoning and if she had persistent symptoms she should proceed to the emergency department.  In the emergency department she was afebrile and blood pressure was stable.  White blood cell count was 12,200 BUN 22 and creatinine 1.01.  Urine showed only trace leukocytes and rare bacteria.  CT of abdomen and pelvis remarkable for air-fluid levels seen throughout the stomach small bowel and colon.  Mild diffuse small bowel and colonic dilatation.  No evidence of bowel obstruction, inflammatory process, or abnormal fluid collections.  Concern expressed for a sending colitis.  Was given Reglan.  She was tried on Bentyl. Was found to be very anxious during the hospitalization.  Has CT enterography showing no pancreatic mass or inflammatory changes.  No hepatic masses.  No evidence of biliary obstruction.  Had diffuse dilatation and air-fluid levels throughout the stomach, small bowel and colon likely due to an ileus.  She was treated with metronidazole, Rocephin, IV fluids and was seen by Dr. Bryan Lemma, Gastroenterologist she was discharged home on Cipro and Flagyl.  Does not have GI follow-up appointment as of yet.  Spoke with Dr. Rush Landmark today and patient will be seen next week by Nurse practitioner on July 27.  Patient remains anxious.  Says that she is having black stools.  Says the stools are loose.  Checked  stool today for guaiac and it was negative.  Dr. Rush Landmark suggested we obtain stool C. difficile today and we did this and result is negative.  Went ahead today and obtained CBC and c-Met.  CBC showed white blood cell count of 6200, hemoglobin 13.3 g MCV 94, platelet count 278,000.  Glucose was 92.  BUN 14 and creatinine 0.84.  Electrolytes were normal.  Serum proteins were normal.  Liver functions were normal with the exception of slightly elevated ALT of 39.     Review of Systems patient says her stool is  black. Has 3-4 loose stools daily. Stool was green at discharge.  No abdominal pain, fever or vomiting.     Objective:   Physical Exam  Vital signs reviewed.  Patient is anxious.  Blood pressure 150/74,pulse 68 regular.  She is afebrile.  Temperature 97.7 degrees by ear thermometer and pulse oximetry is 97% on room air.  Weight is 128 pounds 8 ounces.  BMI is 24.28.  Her weight in September 2022 was 131 pounds.  Skin: Warm and dry.  Chest is clear.  Cardiac exam: Regular rate and rhythm without ectopy.  Abdomen is soft, nondistended with mild diffuse tenderness.  No rebound is present.  Stool is guaiac negative.  She is anxious today.      Assessment & Plan:  Recent admission for colitis treated with IV antibiotics.  Had colonoscopy in 2011 and it was suggested it not be repeated due to her age.  Here today for hospitalization follow-up.  Patient and daughter feel  patient has not progressed as well as that he had hoped and are concerned.  Patient is having black stools, frequent stools that are loose.  Discussion with Dr. Rush Landmark today by phone who suggested several days of Cipro and metronidazole.  Sent in 7-day course of each of these antibiotics.  Daughter will keep a close eye on her and if symptoms persist she will take patient back to the emergency department for evaluation and possible readmission.  KUB obtained today does not show bowel obstruction.  History of allergic  rhinitis with allergy testing positive to trees, grasses, chocolate, green peas, watermelon and cantaloupe.  Currently takes Zyrtec.  History of hives in 2021 thought to be due to anxiety after her husband passed away in the Fall 2020.  Surgery for endometriosis involving left salpingo oophorectomy.  During that time there was obstruction of the sigmoid colon which resulted in left colectomy by Dr. Vida Rigger in 1969.  Subsequently had hysterectomy and right salpingo-oophorectomy by Dr. Lolita Cram.    History of essential hypertension treated with low-dose lisinopril  Hyperlipidemia-mixed type treated with Zetia due to statin intolerance.  Hypothyroidism treated with thyroid replacement medication currently levothyroxine 75 mcg daily  Remote history of polymyalgia rheumatica in 2001 resolved after treatment and has never recurred  Osteopenia-not on bone sparing medication  History of irritable bowel syndrome-longstanding  History of anxiety-longstanding  Plan: Spoke by telephone with Dr. Rush Landmark who was quite helpful.  He suggested obtaining stool for C. difficile which was done and resulted this evening and is negative.  White blood cell count is 6200, hemoglobin 13.3 g.  Electrolytes, BUN and creatinine are normal.  Liver functions are normal with the exception of ALT which is elevated at 39.  Serum proteins are normal.  Potassium is normal at 4.7.  Patient will try to stay hydrated over the weekend.  Has not been vomiting.  KUB obtained today does not show evidence of bowel obstruction.

## 2022-04-21 NOTE — Telephone Encounter (Signed)
Received call from patient's PCP. Reviewed patient's case. Patient of Dr. Lavon Paganini who was seen recently in the hospital by Dr. Barron Alvine. Initial concern was whether she was having a colitis versus colitis/enteritis.  Treated with antibiotics.  Eventually underwent CT enterography which showed improving changes and kept on antibiotics as an outpatient. Follows up in outpatient clinic today and having dark stools (tested and heme negative per report) with continued loose stools. I have recommended that we obtain a CBC/CMP and try to do a C. difficile to ensure that there is no evidence of a C. difficile infection. Hopefully with 5 to 7 days worth of further antibiotics she will have continued improvement and we will get her in for a expedited GI clinic follow-up next week on 7/27 at 1:30 PM with NP Kennedy-Smith. I discussed this with Dr. Lenord Fellers and she agrees with this plan of action. Certainly if the patient's laboratories or electrolytes look abnormal or if the patient were to have progressive failure in the outpatient setting she should come into the hospital for further evaluation and repeat imaging.  I will forward this to patient's primary gastroenterologist as well in case any earlier follow-up may be possible   Corliss Parish, MD [facility] Gastroenterology Advanced Endoscopy Office # [phone]

## 2022-04-21 NOTE — Patient Instructions (Addendum)
Patient has an appointment on Thursday, July 27 for hospitalization follow-up with [facility].  In the interim she will continue with Flagyl and Cipro after discussion with Dr. Meridee Score.  Her KUB x-ray of abdomen shows no evidence of bowel obstruction at this time.  Her stool is guaiac negative.  She looks stable and well-hydrated at the present time.  Daughter will try to collect stool for C. difficile as suggested by Dr. Meridee Score.  She has an order to take this specimen with her to [facility] lab if she is able to obtain it.  She will call if symptoms worsen.  Lab work has been reviewed and is stable except for slight elevation of ALT at 39.

## 2022-04-25 ENCOUNTER — Ambulatory Visit: Payer: Self-pay

## 2022-04-25 NOTE — Patient Outreach (Signed)
  Care Coordination   Follow Up Visit Note   04/25/2022 Name: Dominique Livingston MRN: [id number] DOB: [birth year]  Dominique Livingston is a 88 y.o. year old female who sees Baxley, Luanna Cole, MD for primary care. I spoke with  [name] by phone today  What matters to the patients health and wellness today?  "To figure out why I am having lose stools"   Goals Addressed     Patient Stated     "To figure out why I am having lose stools" (pt-stated)        Care Coordination Interventions: Evaluation of current treatment plan related to colitis and patient's adherence to plan as established by provider Provided education to patient re: importance of staying hydrated with water and electrolyte replacement  Reviewed medications with patient and discussed importance of completing full course of antibiotics as directed Reviewed scheduled/upcoming provider appointment including follow up with [facility] scheduled for 04/27/22, patient's daughter will accompany patient to her appointment Assessed social determinant of health barriers Educated patient to avoid food triggers that may worsen condition Educated patient regarding signs/symptoms of worsening condition and when to seek emergency medical attention       SDOH assessments and interventions completed:   Yes  Care Coordination Interventions Activated:  Yes Care Coordination Interventions:  Yes, provided  Follow up plan: Follow up call scheduled for 05/23/22 @9 :30 AM   Encounter Outcome:  Pt. Visit Completed

## 2022-04-25 NOTE — Patient Instructions (Signed)
Visit Information  Thank you for taking time to visit with me today. Please don't hesitate to contact me if I can be of assistance to you.   Following are the goals we discussed today:   Goals Addressed     Patient Stated     "To figure out why I am having lose stools" (pt-stated)        Care Coordination Interventions: Evaluation of current treatment plan related to colitis and patient's adherence to plan as established by provider Provided education to patient re: importance of staying hydrated with water and electrolyte replacement  Reviewed medications with patient and discussed importance of completing full course of antibiotics as directed Reviewed scheduled/upcoming provider appointment including follow up with [facility] scheduled for 04/27/22, patient's daughter will accompany patient to her appointment Assessed social determinant of health barriers Educated patient to avoid food triggers that may worsen condition Educated patient regarding signs/symptoms of worsening condition and when to seek emergency medical attention     Our next appointment is by telephone on 05/23/22 at 09:30 AM   Please call the care guide team at [phone] if you need to cancel or reschedule your appointment.   If you are experiencing a Mental Health or Behavioral Health Crisis or need someone to talk to, please call 1-800-273-TALK (toll free, 24 hour hotline)   Patient verbalizes understanding of instructions and care plan provided today and agrees to view in MyChart. Active MyChart status and patient understanding of how to access instructions and care plan via MyChart confirmed with patient.     Telephone follow up appointment with care management team member scheduled for: 05/23/22 @09 :30 AM   , RN, BSN, CCM Care Management Coordinator [facility] Care Management Direct Phone: [phone]

## 2022-04-25 NOTE — Progress Notes (Signed)
This encounter was created in error - please disregard.

## 2022-04-27 ENCOUNTER — Encounter: Payer: Self-pay | Admitting: Nurse Practitioner

## 2022-04-27 ENCOUNTER — Ambulatory Visit (INDEPENDENT_AMBULATORY_CARE_PROVIDER_SITE_OTHER): Payer: Medicare Other | Admitting: Nurse Practitioner

## 2022-04-27 VITALS — BP 130/70 | HR 80 | Ht 61.0 in | Wt 127.0 lb

## 2022-04-27 DIAGNOSIS — K5010 Crohn's disease of large intestine without complications: Secondary | ICD-10-CM | POA: Diagnosis not present

## 2022-04-27 DIAGNOSIS — K529 Noninfective gastroenteritis and colitis, unspecified: Secondary | ICD-10-CM | POA: Diagnosis not present

## 2022-04-27 NOTE — Patient Instructions (Signed)
STOP Antibiotic  Use OTC Florastor Probiotic 1 by mouth twice a day for 4 weeks  Follow up with Dr Luther Bradley Alcide Evener NO in 1 week with an update on how you are doing  If you are age 86 or older, your body mass index should be between 23-30. Your Body mass index is 24 kg/m. If this is out of the aforementioned range listed, please consider follow up with your Primary Care Provider.  If you are age 28 or younger, your body mass index should be between 19-25. Your Body mass index is 24 kg/m. If this is out of the aformentioned range listed, please consider follow up with your Primary Care Provider.   ________________________________________________________  The [facility] providers would like to encourage you to use [facility] to communicate with providers for non-urgent requests or questions.  Due to long hold times on the telephone, sending your provider a message by [facility] may be a faster and more efficient way to get a response.  Please allow 48 business hours for a response.  Please remember that this is for non-urgent requests.  _______________________________________________________   I appreciate the  opportunity to care for you  Thank You   Emily Filbert

## 2022-04-27 NOTE — Progress Notes (Signed)
04/27/2022 Dominique Livingston OS:[id number] March 31, 1933   CHIEF COMPLAINT: Hospital follow up  HISTORY OF PRESENT ILLNESS: Dominique Livingston is an 88 year old female with a past medical history of anxiety, hypertension, hyperlipidemia, hypothyroidism, polymyalgia rheumatica, endometriosis status post ex lap with partial left colectomy and hysterectomy 1969. GERD, diverticulosis and hyperplastic colon polyps.  S/P cholecystectomy in 2017. She is known by Dr. Silverio Decamp.  She presents today for follow-up regarding her recent hospitalization with enteritis/colitis.  She was admitted to the hospital 04/08/2022 with complaints of N/V and  generalized abdominal pain.  Labs in the ED showed a WBC count of 12.2.  Hemoglobin 13.  Creatinine 1.01.  Calcium 10.6.  Normal LFTs and lipase level.  CTAP concerning for acute segmental colitis of the ascending colon, scattered short segments of small bowel luminal narrowing concerning for inflammatory enteritis and mildly increased caliber of the distal middle and small bowel loops which low-grade bowel obstruction cannot be excluded.  She received IV fluids, IV Ceftriaxone and Metronidazole. She was evaluated by our inpatient GI service, endoscopic evaluation was discussed, however, patient did not wish to pursue a colonoscopy unless absolutely necessary.  She underwent a small bowel enteroscopy 04/12/2022 which showed diffused dilatation and air-fluid levels throughout the stomach, small bowel and colon suggestive of an ileus but she did not have clinical symptoms for an ileus at that time.  She was discharged home 04/14/2022 on Cipro 500 mg twice daily and Metronidazole 500 mg p.o. 3 times daily for 4 days which she completed.  She continued to have 3 to 4 loose stools daily and her abdominal pain continued to improve  She was seen by Dr. Renold Genta 04/21/2022, at that time she reported passing black loose stools.  A rectal exam was done which showed guaiac negative stool, stool was  green in color.  Dr. Renold Genta contacted Dr. Rush Landmark who recommended a 7-day course of Cipro and metronidazole and to proceed with her follow-up appointment today as previously scheduled.  Labs were done 7/21 which included a negative C. difficile, BUN 14.  Creatinine 0.84.  Sodium 142.  Potassium 4.7.  WBC 6.2.  Hemoglobin 13.3 (Hg 11.0 on 04/14/2022).   Currently, she feels fatigued.  She denies having any significant abdominal pain but she describes of having generalized abdominal tenderness.  She continues to pass at least 3 loose dark brown or black loose stools daily.  She awakened at 2 AM and passed 3 loose stools. No bright red rectal bleeding.  She has mild nausea without vomiting.  No dysphagia or heartburn.  Appetite remains reduced.  Her last doses of Cipro and Flagyl will be completed 7/28.      Latest Ref Rng & Units 04/21/2022   12:51 PM 04/14/2022    4:00 AM 04/13/2022    2:54 AM  CBC  WBC 3.8 - 10.8 Thousand/uL 6.2  5.7  7.0   Hemoglobin 11.7 - 15.5 g/dL 13.3  11.0  11.0   Hematocrit 35.0 - 45.0 % 40.8  33.6  33.1   Platelets 140 - 400 Thousand/uL 278  210  203        Latest Ref Rng & Units 04/21/2022    1:35 PM 04/14/2022    4:00 AM 04/13/2022    2:54 AM  CMP  Glucose 65 - 99 mg/dL 92  89  75   BUN 7 - 25 mg/dL 14  13  6    Creatinine 0.60 - 0.95 mg/dL 0.84  0.82  0.80  Sodium 135 - 146 mmol/L 142  140  139   Potassium 3.5 - 5.3 mmol/L 4.7  3.7  4.1   Chloride 98 - 110 mmol/L 102  107  107   CO2 20 - 32 mmol/L 31  21  20    Calcium 8.6 - 10.4 mg/dL 9.9  8.9  9.1   Total Protein 6.1 - 8.1 g/dL 7.9     Total Bilirubin 0.2 - 1.2 mg/dL 0.5     AST 10 - 35 U/L 30     ALT 6 - 29 U/L 39        CT enterography 04/12/2022: Diffuse dilatation and air-fluid levels throughout the stomach, small bowel, and colon, likely due to ileus.  No signs of bowel obstruction or other acute findings.  Aortic Atherosclerosis   CTAP with contrast 04/08/2022: Lower chest: No acute  abnormality.4 mm nodule identified within the left lower lobe, image 3/5. Unchanged from comparison exam   Hepatobiliary: No suspicious liver lesion. Status post cholecystectomy. Chronic increase caliber of the common bile duct measures up to 1.1 cm. No intrahepatic bile duct dilatation identified.   Pancreas: Unremarkable. No pancreatic ductal dilatation or surrounding inflammatory changes.   Spleen: Normal in size without focal abnormality.   Adrenals/Urinary Tract: Normal adrenal glands. Simple appearing kidney cysts identified. No follow-up imaging recommended. No nephrolithiasis or hydronephrosis identified bilaterally. Urinary bladder is unremarkable.   Stomach/Bowel:   The stomach appears normal. Proximal small bowel loops are unremarkable. The mid and distal small bowel loops are mildly increased in caliber measuring up to 2.7 cm. No signs of high-grade bowel obstruction. There are a few scattered short segment areas of small bowel luminal narrowing, mural enhancement and mild mural thickening.   Signs previous right partial right colectomy with enterocolonic anastomosis identified. The mildly dilated small bowel loops extend up to the anastomosis. There is mild wall thickening and enhancement involving the ascending colon just distal to the anastomosis concerning for acute colitis with possible underlying inflammatory stricture formation. The remaining portions of the colon appear unremarkable. No signs of bowel perforation.   Vascular/Lymphatic: Aortic atherosclerosis. No abdominopelvic adenopathy. There is edema/venous congestion identified within the lower abdominal small bowel mesentery, image 58/3.   Reproductive: Status post hysterectomy. No adnexal masses.   Other: No significant free fluid. No fluid collections. No signs of pneumoperitoneum   Musculoskeletal: Spondylosis identified within the lumbar spine.   IMPRESSION: 1. Signs of previous right  partial colectomy with enterocolonic anastomosis. There is mild wall thickening and enhancement involving the ascending colon just distal to the anastomosis concerning for acute segment colitis with possible underlying inflammatory stricture formation. Correlate for any clinical signs/symptoms of inflammatory bowel disease such as Crohn's disease. 2. There are a scattered short segments of small bowel luminal narrowing, mural enhancement and mild mural thickening. Findings are concerning for inflammatory enteritis. 3. Mildly increased caliber of the mid and distal small bowel loops are identified. Low-grade partial obstruction cannot be excluded. 4. There is edema/venous congestion within the lower abdominal small bowel mesentery. This is a nonspecific finding but can be seen in the setting of inflammatory bowel disease. 5. 4 mm nodule identified within the left lower lobe. Unchanged from previous exam compatible with a benign abnormality. 6. Aortic Atherosclerosis   Colonoscopy 01/18/2010 by Dr. 01/20/2010: 3 diminutive polyps removed from the sigmoid colon Descending diverticulosis COLON, POLYP(S), 15 CM : HYPERPLASTIC POLYP(S). NO ADENOMATOUS CHANGE OR  MALIGNANCY IDENTIFIED.   Past Medical History:  Diagnosis Date  Allergy    Anxiety    Arthritis    hands , stiffness in knees & hips    Colon polyp, hyperplastic    Diverticulosis    Endometriosis    Fibrocystic breast disease    GERD (gastroesophageal reflux disease)    Hyperlipidemia    Hypertension    Hypothyroidism    IBS (irritable bowel syndrome)    Polymyalgia rheumatica (HCC)    Past Surgical History:  Procedure Laterality Date   ABDOMINAL HYSTERECTOMY     BREAST BIOPSY Right    CATARACT EXTRACTION, BILATERAL Bilateral    cataracts   CHOLECYSTECTOMY N/A 05/09/2016   Procedure: LAPAROSCOPIC CHOLECYSTECTOMY WITH INTRAOPERATIVE CHOLANGIOGRAM;  Surgeon: Jackolyn Confer, MD;  Location: [facility];  Service: General;   Laterality: N/A;   LEFT COLECTOMY     due to endometriosos    Social History: Non-smoker.  No alcohol use.  No drug use.  Family History: family history includes COPD in her father; Cancer in her mother; Heart disease in her father; Hypertension in her daughter.  Allergies  Allergen Reactions   Amoxicillin-Pot Clavulanate Nausea Only   Erythromycin Nausea And Vomiting   Mushroom Extract Complex Other (See Comments)    Cantalope, adding enviromental allergens such as greass & trees   Macrobid [Nitrofurantoin Monohyd Macro] Rash      Outpatient Encounter Medications as of 04/27/2022  Medication Sig   ALPRAZolam (XANAX) 0.25 MG tablet Take 1 tablet (0.25 mg total) by mouth 2 (two) times daily as needed. for anxiety (Patient taking differently: Take 0.25 mg by mouth 2 (two) times daily as needed for anxiety.)   cetirizine (ZYRTEC) 10 MG tablet Chew 10 mg by mouth daily.   ciprofloxacin (CIPRO) 500 MG tablet Take 1 tablet (500 mg total) by mouth every 12 (twelve) hours for 7 days.   dicyclomine (BENTYL) 10 MG capsule Take 1 capsule (10 mg total) by mouth 4 (four) times daily -  before meals and at bedtime. (Patient not taking: Reported on 04/25/2022)   ezetimibe (ZETIA) 10 MG tablet Take 1 tablet by mouth once daily   levothyroxine (SYNTHROID) 75 MCG tablet Take 1 tablet by mouth once daily   lisinopril (ZESTRIL) 5 MG tablet Take 1 tablet by mouth once daily   metroNIDAZOLE (FLAGYL) 500 MG tablet Take 1 tablet (500 mg total) by mouth 3 (three) times daily for 7 days.   Multiple Vitamin (MULTIVITAMIN) tablet Take 1 tablet by mouth daily.   ondansetron (ZOFRAN) 4 MG tablet Take 4 mg by mouth every 8 (eight) hours as needed for nausea or vomiting.   Polyethyl Glycol-Propyl Glycol (SYSTANE ULTRA OP) Place 1 drop into both eyes 2 (two) times daily.   [DISCONTINUED] Hyoscyamine Sulfate SL (LEVSIN/SL) 0.125 MG SUBL One tab sublingual  one half hour before eating   No facility-administered  encounter medications on file as of 04/27/2022.    REVIEW OF SYSTEMS:  Gen: + Fatigue. Denies fever, sweats or chills. No weight loss.  CV: Denies chest pain, palpitations or edema. Resp: Denies cough, shortness of breath of hemoptysis.  GI: See HPI. GU : Denies urinary burning, blood in urine, increased urinary frequency or incontinence. MS: Denies joint pain, muscles aches or weakness. Derm: Denies rash, itchiness, skin lesions or unhealing ulcers. Psych: + Anxiety.  Heme: Denies bruising, easy bleeding. Neuro:  Denies headaches, dizziness or paresthesias. Endo:  Denies any problems with DM, thyroid or adrenal function.  PHYSICAL EXAM: BP 130/70 (BP Location: Left Arm, Patient Position: Sitting, Cuff Size:  Normal)   Pulse 80   Ht 5\' 1"  (1.549 m) Comment: height measured without shoes  Wt 127 lb (57.6 kg)   BMI 24.00 kg/m   General: 88 year old female in no acute distress. Head: Normocephalic and atraumatic. Eyes:  Sclerae non-icteric, conjunctive pink. Ears: Normal auditory acuity. Mouth: Dentition intact. No ulcers or lesions.  Neck: Supple, no lymphadenopathy or thyromegaly.  Lungs: Clear bilaterally to auscultation without wheezes, crackles or rhonchi. Heart: Regular rate and rhythm. No murmur, rub or gallop appreciated.  Abdomen: Soft, nontender, non distended. No masses. No hepatosplenomegaly. Normoactive bowel sounds x 4 quadrants.  Lower midline scar intact. Rectal: Deferred. Musculoskeletal: Symmetrical with no gross deformities. Skin: Warm and dry. No rash or lesions on visible extremities. Extremities: No edema. Neurological: Alert oriented x 4, no focal deficits.  Psychological:  Alert and cooperative. Normal mood and affect.  ASSESSMENT AND PLAN:  56) 88 year old female admitted to the hospital 7/8 -7/14 with generalized abdominal pain 04/08/2022.  CTAP identified acute segmental colitis and inflammatory enteritis with a possible low-grade partial small bowel  obstruction treated with bowel rest, IV fluids, antiemetics and antibiotics. CT enterography 04/12/2022 showed diffused dilatation and air-fluid levels throughout the stomach, small bowel and colon suggestive of an ileus but she did not have clinical symptoms for an ileus at that time.  She continued to have at least 3 loose stools post discharge and noted passing black stools on 7/21.  Stool was guaiac negative.  Hemoglobin up from 11->13.3.  C. difficile negative.  Represcribed Cipro and Flagyl x 7 days on 7/21.  Negative abdominal exam today. -Last doses of Cipro and Flagyl are due tomorrow, I advised patient just to stop the antibiotics at this time as I suspect her ongoing dark loose stools may be side effects from antibiotics -Patient to monitor symptoms off antibiotics, hopefully her loose stools will improve.  Diagnostic colonoscopy to rule out IBD deferred for now.  Patient does not wish to pursue a colonoscopy unless absolutely necessary. -Florastor probiotic 1 p.o. twice daily for 4 weeks -Avoid dairy products and fatty foods -Slowly advance diet as -Push fluids -Patient to contact office if loose stools worsens or if abdominal pain recurs -Patient to contact me in 1 week with an update  2) Nausea, likely from antibiotics -See plan in # 1  3) History of hyperplastic polyps per colonoscopy 01/18/2010   CC:  01/20/2010, MD

## 2022-05-05 ENCOUNTER — Telehealth: Payer: Self-pay | Admitting: Nurse Practitioner

## 2022-05-05 NOTE — Telephone Encounter (Signed)
Patient called and said Jill Side asked her to call in today and let her know how she's doing.  She said she has been taking the probiotics and electrolytes as instructed and seems to be doing OK.  Her diarrhea has subsided and she went two days without having any BM's at all and then today she had a somewhat normal BM.  She said overall she thinks she is progressing.  She said to call if there was anything else you thought she should be doing.  Thank you.

## 2022-05-05 NOTE — Telephone Encounter (Signed)
Please see note below. 

## 2022-05-06 NOTE — Telephone Encounter (Signed)
Appreciate update, progressing in the right direction, no new instructions.

## 2022-05-23 ENCOUNTER — Ambulatory Visit: Payer: Self-pay

## 2022-05-23 NOTE — Patient Outreach (Signed)
  Care Coordination   Follow Up Visit Note   05/23/2022 Name: TRINIKA CORTESE MRN: [id number] DOB: 1933-02-22  ABRIANA SALTOS is a 88 y.o. year old female who sees Baxley, Luanna Cole, MD for primary care. I spoke with  [name] by phone today  What matters to the patients health and wellness today?  Patient would like to continue to have normal bowel movements.     Goals Addressed       Patient Stated     "To figure out why I am having lose stools" (pt-stated)        Care Coordination Interventions: Evaluation of current treatment plan related to colitis and patient's adherence to plan as established by provider Review of patient status, including review of consultant's reports, relevant laboratory and other test results, and medications completed Reinforced to patient while providing rationale the importance of staying hydrated with water and electrolyte replacement  Educated patient to avoid food triggers that may worsen condition, especially diary, fatty foods and foods that contain seeds and or nuts Instructed patient to report new symptoms or concerns promptly Reviewed scheduled/upcoming provider appointments including: PCP follow up scheduled with Dr. Lenord Fellers on 06/22/22 @10 :00 AM ;follow up with [facility], Dr. scheduled for 07/07/22 @3 :00 PM, patient's daughter will accompany patient to her appointment    SDOH assessments and interventions completed:  No     Care Coordination Interventions Activated:  Yes  Care Coordination Interventions:  Yes, provided   Follow up plan: Follow up call scheduled for 07/11/22 @09 :45 AM     Encounter Outcome:  Pt. Visit Completed

## 2022-05-23 NOTE — Patient Instructions (Signed)
Visit Information  Thank you for taking time to visit with me today. Please don't hesitate to contact me if I can be of assistance to you.   Following are the goals we discussed today:   Goals Addressed       Patient Stated     "To figure out why I am having lose stools" (pt-stated)        Care Coordination Interventions: Evaluation of current treatment plan related to colitis and patient's adherence to plan as established by provider Review of patient status, including review of consultant's reports, relevant laboratory and other test results, and medications completed Reinforced to patient while providing rationale the importance of staying hydrated with water and electrolyte replacement  Educated patient to avoid food triggers that may worsen condition, especially diary, fatty foods and foods that contain seeds and or nuts Instructed patient to report new symptoms or concerns promptly Reviewed scheduled/upcoming provider appointments including: PCP follow up scheduled with Dr. Lenord Fellers on 06/22/22 @10 :00 AM ;follow up with [facility], Dr. scheduled for 07/07/22 @3 :00 PM, patient's daughter will accompany patient to her appointment    Our next appointment is by telephone on 07/11/22 at 09:45 AM   Please call the care guide team at [phone] if you need to cancel or reschedule your appointment.   If you are experiencing a Mental Health or Behavioral Health Crisis or need someone to talk to, please call 1-800-273-TALK (toll free, 24 hour hotline)  Patient verbalizes understanding of instructions and care plan provided today and agrees to view in MyChart. Active MyChart status and patient understanding of how to access instructions and care plan via MyChart confirmed with patient.     09/10/22, RN, BSN, CCM Care Management Coordinator [facility] Care Management  Direct Phone: [phone]

## 2022-06-13 ENCOUNTER — Other Ambulatory Visit: Payer: Self-pay | Admitting: Internal Medicine

## 2022-06-22 ENCOUNTER — Other Ambulatory Visit: Payer: Medicare Other

## 2022-06-22 ENCOUNTER — Encounter: Payer: Self-pay | Admitting: Internal Medicine

## 2022-06-22 ENCOUNTER — Ambulatory Visit (INDEPENDENT_AMBULATORY_CARE_PROVIDER_SITE_OTHER): Payer: Medicare Other | Admitting: Internal Medicine

## 2022-06-22 VITALS — BP 126/74 | HR 66 | Temp 97.6°F | Ht 61.0 in | Wt 123.1 lb

## 2022-06-22 DIAGNOSIS — E039 Hypothyroidism, unspecified: Secondary | ICD-10-CM | POA: Diagnosis not present

## 2022-06-22 DIAGNOSIS — Z8739 Personal history of other diseases of the musculoskeletal system and connective tissue: Secondary | ICD-10-CM | POA: Diagnosis not present

## 2022-06-22 DIAGNOSIS — Z8719 Personal history of other diseases of the digestive system: Secondary | ICD-10-CM

## 2022-06-22 DIAGNOSIS — Z789 Other specified health status: Secondary | ICD-10-CM

## 2022-06-22 DIAGNOSIS — E782 Mixed hyperlipidemia: Secondary | ICD-10-CM | POA: Diagnosis not present

## 2022-06-22 DIAGNOSIS — F411 Generalized anxiety disorder: Secondary | ICD-10-CM | POA: Diagnosis not present

## 2022-06-22 DIAGNOSIS — Z0000 Encounter for general adult medical examination without abnormal findings: Secondary | ICD-10-CM | POA: Diagnosis not present

## 2022-06-22 DIAGNOSIS — I10 Essential (primary) hypertension: Secondary | ICD-10-CM | POA: Diagnosis not present

## 2022-06-22 LAB — POCT URINALYSIS DIPSTICK
Bilirubin, UA: NEGATIVE
Blood, UA: NEGATIVE
Glucose, UA: NEGATIVE
Ketones, UA: NEGATIVE
Leukocytes, UA: NEGATIVE
Nitrite, UA: NEGATIVE
Protein, UA: NEGATIVE
Spec Grav, UA: 1.01 (ref 1.010–1.025)
Urobilinogen, UA: 0.2 E.U./dL
pH, UA: 5 (ref 5.0–8.0)

## 2022-06-22 NOTE — Patient Instructions (Addendum)
It was a pleasure to see you today. Patient declines flu vaccine. Recommend Covid booster when new vaccine available at pharmacy.labs drawn and pending. I think fiber supplement may help obstipation from soft stool.Sees GI soon. Discontinue Zetia.

## 2022-06-22 NOTE — Progress Notes (Signed)
Annual Wellness Visit     Patient: Dominique Livingston, Female    DOB: Nov 01, 1932, 88 y.o.   MRN: [id number] Visit Date: 06/22/2022  Chief Complaint  Patient presents with   Medicare [place] is a 88 y.o. female who presents today for her Annual Wellness Visit.  HPI Patient also seen for health maintenance exam and evaluation of medical issues. Having obstipation. Suggest fiber supplement.   Having hoarseness. No coughing. Will try Depomedrol for allergic rhinitis and hoarseness.   Has follow up with Dr. Silverio Decamp in a couple of weeks for segmental colitis but currently no pain just soft bowel movements.  She was admitted to the hospital in early July with abdominal pain, nausea and vomiting.  She has history of partial colectomy with enterocolonic colitis to [facility].  History of chronic constipation.  History of hypertension and hyperlipidemia.  She was diagnosed with partial small bowel obstruction and irritable bowel syndrome.  CT showed diffuse dilatation and air-fluid levels throughout the stomach, small bowel and colon likely due to an ileus.  There was concern for acute segmental colitis with possible inflammatory stricture.  There was edema/venous congestion within the lower abdominal small bowel mesentery.  It was treated conservatively with IV fluids along with metronidazole, Rocephin and was discharged home with follow-up with [facility].  We saw her in follow-up on July 21 after her discharge on July 14.  She was complaining of black stools and frequent stools that were loose.  We contacted [facility] who suggested several days of Cipro and metronidazole.  Stool for C. difficile was negative.  She has a history of allergic rhinitis, history of urticaria in 2021 thought to be due to anxiety after her husband passed away in the Fall 2020.  Surgery for endometriosis involving left salpingo-oophorectomy.  During that time there was obstruction of the  sigmoid colon which resulted in left colectomy by Dr. Vida Rigger in 1969.  She subsequently had hysterectomy and right salpingo-oophorectomy by Dr. Alden Server.  History of hypertension treated with low-dose lisinopril.  Hyperlipidemia-mixed type treated with Zetia due to statin intolerance  Hypothyroidism treated with thyroid replacement medication.  Remote history of polymyalgia rheumatica in 2001 resolved after treatment and has never recurred.  Osteopenia not on bone sparing medication.  Longstanding history of anxiety.  Social history: She is a widow who resides alone.  Husband was a retired Agricultural consultant.  She has 2 adult children.  Family history: Father died at age 28 with pneumonia/COPD/heart problems.  Mother died at age 49 of cancer that was metastatic from unknown primary.  2 brothers with hypertension.  Daughter with history of hypertension and allergies.       Review of Systems she is feeling well today.  Feels that abdominal issues are much improved.   Objective    Vitals: BP 126/74   Pulse 66   Temp 97.6 F (36.4 C) (Tympanic)   Ht 5\' 1"  (1.549 m)   Wt 123 lb 1.9 oz (55.8 kg)   SpO2 97%   BMI 23.26 kg/m   Physical Exam  Skin: Warm and dry.  No thyromegaly.  No carotid bruits.  No cervical adenopathy.  Chest clear.  Cardiac exam: Regular rate and rhythm.  Abdomen soft nondistended without hepatosplenomegaly masses or tenderness.  No lower extremity pitting edema.  Brief neurological exam is intact without gross focal deficits.   Most recent functional status assessment:    06/22/2022  10:37 AM  In your present state of health, do you have any difficulty performing the following activities:  Hearing? 0  Vision? 0  Difficulty concentrating or making decisions? 0  Walking or climbing stairs? 0  Dressing or bathing? 0  Doing errands, shopping? 0  Preparing Food and eating ? N  Using the Toilet? N  In the past six months, have you accidently leaked urine? N   Do you have problems with loss of bowel control? Y  Managing your Medications? N  Managing your Finances? N  Housekeeping or managing your Housekeeping? N   Most recent fall risk assessment:    06/22/2022   10:35 AM  Fall Risk   Falls in the past year? 0  Number falls in past yr: 0  Injury with Fall? 0  Risk for fall due to : No Fall Risks  Follow up Falls evaluation completed    Most recent depression screenings:    06/22/2022   10:35 AM 05/31/2021    4:24 PM  PHQ 2/9 Scores  PHQ - 2 Score 0 0   Most recent cognitive screening:    06/22/2022   10:40 AM  6CIT Screen  What Year? 0 points  What month? 0 points  What time? 0 points  Count back from 20 0 points  Months in reverse 0 points  Repeat phrase 0 points  Total Score 0 points       Assessment & Plan   Recently hospitalized with colitis and partial small bowel obstruction.  Has improved and currently is asymptomatic. She has follow-up in the near future with GI.  She was treated with antibiotics.  C. difficile was ruled out.  Hyperlipidemia treated with Zetia.  Intolerant of statin medication.  I do not think she has been taking Zetia recently total cholesterol was 264, triglycerides 198 and LDL cholesterol 169.  At her age, it may not be reasonable to try Zetia since she feels that it may have caused some issues.  Anxiety treated with Xanax and stable  History of polymyalgia rheumatica in the remote past  Hypothyroidism stable on thyroid replacement medication.    History of bilateral cataract extractions   History of allergic rhinitis to trees, grasses, chocolate, green peas, watermelon and cantaloupe  Intolerant of Fosamax as it causes adverse reaction  History of hysterectomy and right salpingo-oophorectomy  History of ovarian cyst removed in the 1960s  History of benign right breast biopsy 1979  Plan: At patient's age it may not be reasonable to try Zetia again since she thinks it has side  effects. This will be discontinued  Take Zestril 5 mg daily for mild hypertension  Take Zyrtec as needed for allergy symptoms.  Vaccines discussed including COVID booster, flu vaccine and consider RSV vaccine as well .She has had pneumococcal 13 and pneumococcal 23 vaccines. RTC in one year or as needed. Keep follow up GI appt       Annual wellness visit done today including the all of the following: Reviewed patient's Family Medical History Reviewed and updated list of patient's medical providers Assessment of cognitive impairment was done Assessed patient's functional ability Established a written schedule for health screening services Health Risk Assessent Completed and Reviewed  Discussed health benefits of physical activity, and encouraged her to engage in regular exercise appropriate for her age and condition.         IMargaree Mackintosh, MD, have reviewed all documentation for this visit. The documentation on 07/02/22 for the  exam, diagnosis, procedures, and orders are all accurate and complete.   LaVon Philipp Deputy, CMA

## 2022-06-23 LAB — LIPID PANEL
Cholesterol: 264 mg/dL — ABNORMAL HIGH (ref <200)
HDL: 59 mg/dL (ref >=50)
LDL Cholesterol (Calc): 169 mg/dL (calc) — ABNORMAL HIGH
Non-HDL Cholesterol (Calc): 205 mg/dL (calc) — ABNORMAL HIGH (ref <130)
Total CHOL/HDL Ratio: 4.5 (calc) (ref <5.0)
Triglycerides: 198 mg/dL — ABNORMAL HIGH (ref <150)

## 2022-06-23 LAB — COMPLETE METABOLIC PANEL WITH GFR
AG Ratio: 1.5 (calc) (ref 1.0–2.5)
ALT: 57 U/L — ABNORMAL HIGH (ref 6–29)
AST: 39 U/L — ABNORMAL HIGH (ref 10–35)
Albumin: 5 g/dL (ref 3.6–5.1)
Alkaline phosphatase (APISO): 57 U/L (ref 37–153)
BUN: 18 mg/dL (ref 7–25)
CO2: 29 mmol/L (ref 20–32)
Calcium: 10.4 mg/dL (ref 8.6–10.4)
Chloride: 102 mmol/L (ref 98–110)
Creat: 0.79 mg/dL (ref 0.60–0.95)
Globulin: 3.4 g/dL (calc) (ref 1.9–3.7)
Glucose, Bld: 90 mg/dL (ref 65–99)
Potassium: 4.6 mmol/L (ref 3.5–5.3)
Sodium: 143 mmol/L (ref 135–146)
Total Bilirubin: 0.5 mg/dL (ref 0.2–1.2)
Total Protein: 8.4 g/dL — ABNORMAL HIGH (ref 6.1–8.1)
eGFR: 72 mL/min/{1.73_m2} (ref >=60)

## 2022-06-23 LAB — CBC WITH DIFFERENTIAL/PLATELET
Absolute Monocytes: 309 cells/uL (ref 200–950)
Basophils Absolute: 69 cells/uL (ref 0–200)
Basophils Relative: 1.1 %
Eosinophils Absolute: 82 cells/uL (ref 15–500)
Eosinophils Relative: 1.3 %
HCT: 40 % (ref 35.0–45.0)
Hemoglobin: 13.2 g/dL (ref 11.7–15.5)
Lymphs Abs: 1399 cells/uL (ref 850–3900)
MCH: 30.7 pg (ref 27.0–33.0)
MCHC: 33 g/dL (ref 32.0–36.0)
MCV: 93 fL (ref 80.0–100.0)
MPV: 9 fL (ref 7.5–12.5)
Monocytes Relative: 4.9 %
Neutro Abs: 4442 cells/uL (ref 1500–7800)
Neutrophils Relative %: 70.5 %
Platelets: 234 10*3/uL (ref 140–400)
RBC: 4.3 10*6/uL (ref 3.80–5.10)
RDW: 12.4 % (ref 11.0–15.0)
Total Lymphocyte: 22.2 %
WBC: 6.3 10*3/uL (ref 3.8–10.8)

## 2022-06-23 LAB — TSH: TSH: 0.8 mIU/L (ref 0.40–4.50)

## 2022-07-03 ENCOUNTER — Other Ambulatory Visit: Payer: Self-pay | Admitting: Internal Medicine

## 2022-07-07 ENCOUNTER — Ambulatory Visit (INDEPENDENT_AMBULATORY_CARE_PROVIDER_SITE_OTHER): Payer: Medicare Other | Admitting: Gastroenterology

## 2022-07-07 ENCOUNTER — Encounter: Payer: Self-pay | Admitting: Gastroenterology

## 2022-07-07 VITALS — BP 138/68 | HR 95 | Ht 61.0 in | Wt 123.1 lb

## 2022-07-07 DIAGNOSIS — D509 Iron deficiency anemia, unspecified: Secondary | ICD-10-CM

## 2022-07-07 DIAGNOSIS — K5010 Crohn's disease of large intestine without complications: Secondary | ICD-10-CM

## 2022-07-07 NOTE — Progress Notes (Signed)
Dominique Livingston    [id number]    01-Jul-1933  Primary Care Physician:Baxley, Luanna Cole, MD  Referring Physician: Margaree Mackintosh, MD [address] [place],  [place] [address]   Chief complaint: History of colitis  HPI:  88 year old female with history of of anxiety, hypertension, hyperlipidemia, hypothyroidism, polymyalgia rheumatica, endometriosis status post ex lap with partial left colectomy and hysterectomy 1969 here for follow up visit after hospitalization with segmental colitis. Likely etiology was self limited ischemic/infectious colitis   Overall she is doing well with no specific GI complaints other than irregular bowel habits and certain dietary intolerance.  She was treated with course of antibiotics in July 2023 for the colitis  She continues to struggle with her diet, not sure what she can eat or not.  Denies any nausea, vomiting, abdominal pain, melena or bright red blood per rectum  CT enterography 04/12/2022: Diffuse dilatation and air-fluid levels throughout the stomach, small bowel, and colon, likely due to ileus.  No signs of bowel obstruction or other acute findings.  Aortic Atherosclerosis   CTAP with contrast 04/08/2022: 1. Signs of previous right partial colectomy with enterocolonic anastomosis. There is mild wall thickening and enhancement involving the ascending colon just distal to the anastomosis concerning for acute segment colitis with possible underlying inflammatory stricture formation. Correlate for any clinical signs/symptoms of inflammatory bowel disease such as Crohn's disease. 2. There are a scattered short segments of small bowel luminal narrowing, mural enhancement and mild mural thickening. Findings are concerning for inflammatory enteritis. 3. Mildly increased caliber of the mid and distal small bowel loops are identified. Low-grade partial obstruction cannot be excluded. 4. There is edema/venous congestion within the  lower abdominal small bowel mesentery. This is a nonspecific finding but can be seen in the setting of inflammatory bowel disease. 5. 4 mm nodule identified within the left lower lobe. Unchanged from previous exam compatible with a benign abnormality. 6. Aortic Atherosclerosis     CT abdomen pelvis January 17, 2018 showed fluid throughout the majority of nondistended small and large bowel raising the possibility of possible underlying enteritis, no bowel wall thickening or evidence of bowel wall inflammation.  There is subtle hazy edema in the lower abdominal mesentery   She underwent cholecystectomy on May 09, 2016   Abdominal ultrasound April 16, 2016 Cholelithiasis with a large stone in the gallbladder, mild pericholecystic edema, mass versus tumefactive sludge in the gallbladder     Colonoscopy January 18, 2010 with removal of 3 small sessile polyps in sigmoid colon and sigmoid diverticulosis   Outpatient Encounter Medications as of 07/07/2022  Medication Sig   ALPRAZolam (XANAX) 0.25 MG tablet Take 1 tablet (0.25 mg total) by mouth 2 (two) times daily as needed. for anxiety (Patient taking differently: Take 0.25 mg by mouth 2 (two) times daily as needed for anxiety.)   cetirizine (ZYRTEC) 10 MG tablet Chew 10 mg by mouth daily.   levothyroxine (SYNTHROID) 75 MCG tablet Take 1 tablet by mouth once daily   lisinopril (ZESTRIL) 5 MG tablet Take 1 tablet by mouth once daily   Multiple Vitamin (MULTIVITAMIN) tablet Take 1 tablet by mouth daily.   Polyethyl Glycol-Propyl Glycol (SYSTANE ULTRA OP) Place 1 drop into both eyes 2 (two) times daily.   dicyclomine (BENTYL) 10 MG capsule Take 1 capsule (10 mg total) by mouth 4 (four) times daily -  before meals and at bedtime.   ezetimibe (ZETIA) 10 MG tablet Take  1 tablet by mouth once daily (Patient not taking: Reported on 07/07/2022)   ondansetron (ZOFRAN) 4 MG tablet Take 4 mg by mouth every 8 (eight) hours as needed for nausea or vomiting.  (Patient not taking: Reported on 07/07/2022)   [DISCONTINUED] Hyoscyamine Sulfate SL (LEVSIN/SL) 0.125 MG SUBL One tab sublingual  one half hour before eating   No facility-administered encounter medications on file as of 07/07/2022.    Allergies as of 07/07/2022 - Review Complete 07/07/2022  Allergen Reaction Noted   Amoxicillin-pot clavulanate Nausea Only 02/02/2011   Erythromycin Nausea And Vomiting 02/02/2011   Mushroom extract complex Other (See Comments) 05/03/2016   Macrobid [nitrofurantoin monohyd macro] Rash 01/20/2020    Past Medical History:  Diagnosis Date   Allergy    Anxiety    Arthritis    hands , stiffness in knees & hips    Colon polyp, hyperplastic    Diverticulosis    Endometriosis    Fibrocystic breast disease    GERD (gastroesophageal reflux disease)    Hyperlipidemia    Hypertension    Hypothyroidism    IBS (irritable bowel syndrome)    Polymyalgia rheumatica (HCC)     Past Surgical History:  Procedure Laterality Date   ABDOMINAL HYSTERECTOMY     APPENDECTOMY     BREAST BIOPSY Right    CATARACT EXTRACTION, BILATERAL Bilateral    cataracts   CHOLECYSTECTOMY N/A 05/09/2016   Procedure: LAPAROSCOPIC CHOLECYSTECTOMY WITH INTRAOPERATIVE CHOLANGIOGRAM;  Surgeon: Avel Peace, MD;  Location: [facility] OR;  Service: General;  Laterality: N/A;   LEFT COLECTOMY     removed about 18 in due to endometriosos    Family History  Problem Relation Age of Onset   Cancer Mother        type unknown   Hypertension Mother    Heart disease Father    COPD Father    Hypertension Daughter    Heart disease Son    Heart attack Son    Breast cancer Neg Hx     Social History   Socioeconomic History   Marital status: Widowed    Spouse name: Not on file   Number of children: 2   Years of education: Not on file   Highest education level: Not on file  Occupational History   Occupation: retired  Tobacco Use   Smoking status: Never   Smokeless tobacco: Never  Vaping  Use   Vaping Use: Never used  Substance and Sexual Activity   Alcohol use: No   Drug use: No   Sexual activity: Not on file  Other Topics Concern   Not on file  Social History Narrative   Not on file   Social Determinants of Health   Financial Resource Strain: Not on file  Food Insecurity: Not on file  Transportation Needs: Not on file  Physical Activity: Not on file  Stress: Not on file  Social Connections: Not on file  Intimate Partner Violence: Not on file      Review of systems: All other review of systems negative except as mentioned in the HPI.   Physical Exam: Vitals:   07/07/22 1443  BP: 138/68  Pulse: 95   Body mass index is 23.26 kg/m. Gen:      No acute distress HEENT:  sclera anicteric Abd:      soft, non-tender; no palpable masses, no distension Ext:    No edema Neuro: alert and oriented x 3 Psych: normal mood and affect  Data Reviewed:  Reviewed labs,  radiology imaging, old records and pertinent past GI work up   Assessment and Plan/Recommendations:  88 year old female with history of of anxiety, hypertension, hyperlipidemia, hypothyroidism, polymyalgia rheumatica, endometriosis status post ex lap with partial left colectomy and hysterectomy 1969 here for follow up visit after hospitalization with segmental colitis. Likely etiology was self limited ischemic/infectious colitis.  Advised patient to expand her dietary choices, increase intake of fruits and vegetables along with 8 to 10 cups of water per day Use dicyclomine 10 mg up to twice daily as needed Trial of lactose-free diet Continue psyllium husk  Return as needed  This visit required 40 minutes of patient care (this includes precharting, chart review, review of results, face-to-face time used for counseling as well as treatment plan and follow-up. The patient was provided an opportunity to ask questions and all were answered. The patient agreed with the plan and demonstrated an  understanding of the instructions.  Damaris Hippo , MD    CC: Elby Showers, MD

## 2022-07-07 NOTE — Patient Instructions (Signed)
_______________________________________________________  If you are age 86 or older, your body mass index should be between 23-30. Your Body mass index is 23.26 kg/m. If this is out of the aforementioned range listed, please consider follow up with your Primary Care Provider.  If you are age 24 or younger, your body mass index should be between 19-25. Your Body mass index is 23.26 kg/m. If this is out of the aformentioned range listed, please consider follow up with your Primary Care Provider.   ________________________________________________________  The [facility] providers would like to encourage you to use [facility] to communicate with providers for non-urgent requests or questions.  Due to long hold times on the telephone, sending your provider a message by [facility] may be a faster and more efficient way to get a response.  Please allow 48 business hours for a response.  Please remember that this is for non-urgent requests.  _______________________________________________________   Follow-up as needed.   Thank you for choosing me and [facility].  Dr.Nandigam

## 2022-07-11 ENCOUNTER — Ambulatory Visit: Payer: Self-pay

## 2022-07-11 NOTE — Patient Instructions (Signed)
Visit Information  Thank you for taking time to visit with me today. Please don't hesitate to contact me if I can be of assistance to you.   Following are the goals we discussed today:   Goals Addressed               This Visit's Progress     Patient Stated     "To figure out why I am having lose stools" (pt-stated)        Care Coordination Interventions: Evaluation of current treatment plan related to colitis and patient's adherence to plan as established by provider Determined patient completed her GI follow up with Nandigam on 07/07/22 for evaluation of GI symptoms  Review of patient status, including review of consultant's reports, relevant laboratory and other test results, and medications completed Determined patient is taking Metamucil 4 in 1 powder mix twice daily as directed Reviewed and discussed elevated liver enzymes, Dr. Silverio Decamp did not provide interventions to patient Instructed patient to contact Dr. Silverio Decamp to ask for recommendations for follow up for elevated liver enzymes, patient will ask her daughter Santiago Glad to make the call Educated patient regarding lemon water and herbal tea to help with liver detox and encouraged patient to ask her doctor for further recommendations Instructed patient to avoid Acetaminophen until she is able to clear with her doctor        Our next appointment is by telephone on 09/11/22 at 0930 AM   Please call the care guide team at [phone] if you need to cancel or reschedule your appointment.   If you are experiencing a Mental Health or [place] or need someone to talk to, please call 1-800-273-TALK (toll free, 24 hour hotline)  Patient verbalizes understanding of instructions and care plan provided today and agrees to view in [facility]. Active MyChart status and patient understanding of how to access instructions and care plan via MyChart confirmed with patient.     Barb Merino, RN, BSN, CCM Care Management  Coordinator [facility] Management Direct Phone: [phone]

## 2022-07-11 NOTE — Patient Outreach (Signed)
  Care Coordination   Follow Up Visit Note   07/11/2022 Name: Dominique Livingston MRN: [id number] DOB: Mar 18, 1933  Dominique Livingston is a 88 y.o. year old female who sees Baxley, Cresenciano Lick, MD for primary care. I spoke with  Dominique Livingston by phone today.  What matters to the patients health and wellness today?  Patient will contact her GI doctor to discuss her elevated liver enzymes.     Goals Addressed               This Visit's Progress     Patient Stated     "To figure out why I am having lose stools" (pt-stated)        Care Coordination Interventions: Evaluation of current treatment plan related to colitis and patient's adherence to plan as established by provider Determined patient completed her GI follow up with Nandigam on 07/07/22 for evaluation of GI symptoms  Review of patient status, including review of consultant's reports, relevant laboratory and other test results, and medications completed Determined patient is taking Metamucil 4 in 1 powder mix twice daily as directed Reviewed and discussed elevated liver enzymes, Dr. Silverio Decamp did not provide interventions to patient Instructed patient to contact Dr. Silverio Decamp to ask for recommendations for follow up for elevated liver enzymes, patient will ask her daughter Dominique Livingston to make the call Educated patient regarding lemon water and herbal tea to help with liver detox and encouraged patient to ask her doctor for further recommendations Instructed patient to avoid Acetaminophen until she is able to clear with her doctor        SDOH assessments and interventions completed:  No     Care Coordination Interventions Activated:  Yes  Care Coordination Interventions:  Yes, provided   Follow up plan: Follow up call scheduled for 09/11/22 @0930  AM    Encounter Outcome:  Pt. Visit Completed

## 2022-07-31 ENCOUNTER — Encounter: Payer: Self-pay | Admitting: Gastroenterology

## 2022-08-18 ENCOUNTER — Other Ambulatory Visit: Payer: Self-pay | Admitting: Internal Medicine

## 2022-09-08 ENCOUNTER — Other Ambulatory Visit: Payer: Self-pay | Admitting: Internal Medicine

## 2022-09-11 ENCOUNTER — Ambulatory Visit: Payer: Self-pay

## 2022-09-11 ENCOUNTER — Other Ambulatory Visit: Payer: Self-pay | Admitting: Internal Medicine

## 2022-09-11 NOTE — Patient Instructions (Signed)
Visit Information  Thank you for taking time to visit with me today. Please don't hesitate to contact me if I can be of assistance to you.   Following are the goals we discussed today:   Goals Addressed               This Visit's Progress     Patient Stated     COMPLETED: "To figure out why I am having lose stools" (pt-stated)        Care Coordination Interventions: Evaluation of current treatment plan related to colitis and patient's adherence to plan as established by provider Discussed with patient she has changed over to benefiber from [facility] with good results Educated patient regarding dosing and frequency of benefiber  Educated patient regarding gluten intolerance Instructed patient to keep a food diary in order to pinpoint any gluten triggers  Instructed patient to notify her doctor of new symptoms or concerns         Our next appointment is by telephone on 11/13/22 at 0930 AM  Please call the care guide team at [phone] if you need to cancel or reschedule your appointment.   If you are experiencing a Mental Health or Behavioral Health Crisis or need someone to talk to, please call 1-800-273-TALK (toll free, 24 hour hotline)  Patient verbalizes understanding of instructions and care plan provided today and agrees to view in MyChart. Active MyChart status and patient understanding of how to access instructions and care plan via MyChart confirmed with patient.     Delsa Sale, RN, BSN, CCM Care Management Coordinator [facility] Care Management Direct Phone: [phone]

## 2022-09-11 NOTE — Patient Outreach (Signed)
  Care Coordination   Follow Up Visit Note   09/11/2022 Name: Dominique Livingston MRN: [id number] DOB: 10/29/1932  Dominique BAUMGARNER is a 88 y.o. year old female who sees Baxley, Luanna Cole, MD for primary care. I spoke with  Dominique Livingston by phone today.  What matters to the patients health and wellness today?  Patient will continue to work on monitoring her fiber intake and start a food diary to help determined food triggers for changes in digestion.     Goals Addressed               This Visit's Progress     Patient Stated     COMPLETED: "To figure out why I am having lose stools" (pt-stated)        Care Coordination Interventions: Evaluation of current treatment plan related to colitis and patient's adherence to plan as established by provider Discussed with patient she has changed over to benefiber from metamucil with good results Educated patient regarding dosing and frequency of benefiber  Educated patient regarding gluten intolerance Instructed patient to keep a food diary in order to pinpoint any gluten triggers  Instructed patient to notify her doctor of new symptoms or concerns         SDOH assessments and interventions completed:  No     Care Coordination Interventions:  Yes, provided   Follow up plan: Follow up call scheduled for 11/13/22 @0930  AM    Encounter Outcome:  Pt. Visit Completed

## 2022-10-12 DIAGNOSIS — Z23 Encounter for immunization: Secondary | ICD-10-CM | POA: Diagnosis not present

## 2022-11-03 ENCOUNTER — Emergency Department (HOSPITAL_COMMUNITY): Payer: Medicare Other

## 2022-11-03 ENCOUNTER — Inpatient Hospital Stay (HOSPITAL_COMMUNITY)
Admission: EM | Admit: 2022-11-03 | Discharge: 2022-11-09 | DRG: 470 | Disposition: A | Discharge status: Rehabilitation Facility | Payer: Medicare Other | Attending: Internal Medicine | Admitting: Internal Medicine

## 2022-11-03 ENCOUNTER — Encounter (HOSPITAL_COMMUNITY): Payer: Self-pay

## 2022-11-03 ENCOUNTER — Other Ambulatory Visit: Payer: Self-pay

## 2022-11-03 DIAGNOSIS — Z8719 Personal history of other diseases of the digestive system: Secondary | ICD-10-CM | POA: Diagnosis not present

## 2022-11-03 DIAGNOSIS — S93491A Sprain of other ligament of right ankle, initial encounter: Secondary | ICD-10-CM | POA: Diagnosis present

## 2022-11-03 DIAGNOSIS — W1809XD Striking against other object with subsequent fall, subsequent encounter: Secondary | ICD-10-CM | POA: Diagnosis not present

## 2022-11-03 DIAGNOSIS — S72009A Fracture of unspecified part of neck of unspecified femur, initial encounter for closed fracture: Secondary | ICD-10-CM | POA: Diagnosis present

## 2022-11-03 DIAGNOSIS — S82891A Other fracture of right lower leg, initial encounter for closed fracture: Secondary | ICD-10-CM | POA: Diagnosis not present

## 2022-11-03 DIAGNOSIS — S93401A Sprain of unspecified ligament of right ankle, initial encounter: Secondary | ICD-10-CM | POA: Diagnosis not present

## 2022-11-03 DIAGNOSIS — Z880 Allergy status to penicillin: Secondary | ICD-10-CM | POA: Diagnosis not present

## 2022-11-03 DIAGNOSIS — S72401A Unspecified fracture of lower end of right femur, initial encounter for closed fracture: Principal | ICD-10-CM | POA: Diagnosis present

## 2022-11-03 DIAGNOSIS — G8911 Acute pain due to trauma: Secondary | ICD-10-CM | POA: Diagnosis not present

## 2022-11-03 DIAGNOSIS — F419 Anxiety disorder, unspecified: Secondary | ICD-10-CM | POA: Diagnosis present

## 2022-11-03 DIAGNOSIS — S93401D Sprain of unspecified ligament of right ankle, subsequent encounter: Secondary | ICD-10-CM | POA: Diagnosis not present

## 2022-11-03 DIAGNOSIS — K589 Irritable bowel syndrome without diarrhea: Secondary | ICD-10-CM | POA: Diagnosis present

## 2022-11-03 DIAGNOSIS — Z8249 Family history of ischemic heart disease and other diseases of the circulatory system: Secondary | ICD-10-CM | POA: Diagnosis not present

## 2022-11-03 DIAGNOSIS — M353 Polymyalgia rheumatica: Secondary | ICD-10-CM | POA: Diagnosis present

## 2022-11-03 DIAGNOSIS — Z825 Family history of asthma and other chronic lower respiratory diseases: Secondary | ICD-10-CM | POA: Diagnosis not present

## 2022-11-03 DIAGNOSIS — E039 Hypothyroidism, unspecified: Secondary | ICD-10-CM | POA: Diagnosis present

## 2022-11-03 DIAGNOSIS — Z96641 Presence of right artificial hip joint: Secondary | ICD-10-CM | POA: Diagnosis not present

## 2022-11-03 DIAGNOSIS — M25551 Pain in right hip: Secondary | ICD-10-CM | POA: Diagnosis present

## 2022-11-03 DIAGNOSIS — S82891D Other fracture of right lower leg, subsequent encounter for closed fracture with routine healing: Secondary | ICD-10-CM | POA: Diagnosis not present

## 2022-11-03 DIAGNOSIS — K219 Gastro-esophageal reflux disease without esophagitis: Secondary | ICD-10-CM | POA: Diagnosis present

## 2022-11-03 DIAGNOSIS — Z9049 Acquired absence of other specified parts of digestive tract: Secondary | ICD-10-CM | POA: Diagnosis not present

## 2022-11-03 DIAGNOSIS — R3915 Urgency of urination: Secondary | ICD-10-CM | POA: Diagnosis present

## 2022-11-03 DIAGNOSIS — Z90710 Acquired absence of both cervix and uterus: Secondary | ICD-10-CM | POA: Diagnosis not present

## 2022-11-03 DIAGNOSIS — Y92009 Unspecified place in unspecified non-institutional (private) residence as the place of occurrence of the external cause: Secondary | ICD-10-CM

## 2022-11-03 DIAGNOSIS — Z91040 Latex allergy status: Secondary | ICD-10-CM

## 2022-11-03 DIAGNOSIS — Z79899 Other long term (current) drug therapy: Secondary | ICD-10-CM | POA: Diagnosis not present

## 2022-11-03 DIAGNOSIS — Z9842 Cataract extraction status, left eye: Secondary | ICD-10-CM

## 2022-11-03 DIAGNOSIS — Z888 Allergy status to other drugs, medicaments and biological substances status: Secondary | ICD-10-CM

## 2022-11-03 DIAGNOSIS — I10 Essential (primary) hypertension: Secondary | ICD-10-CM | POA: Diagnosis present

## 2022-11-03 DIAGNOSIS — Z91018 Allergy to other foods: Secondary | ICD-10-CM

## 2022-11-03 DIAGNOSIS — S72011A Unspecified intracapsular fracture of right femur, initial encounter for closed fracture: Secondary | ICD-10-CM | POA: Diagnosis not present

## 2022-11-03 DIAGNOSIS — D696 Thrombocytopenia, unspecified: Secondary | ICD-10-CM | POA: Diagnosis not present

## 2022-11-03 DIAGNOSIS — Z9841 Cataract extraction status, right eye: Secondary | ICD-10-CM

## 2022-11-03 DIAGNOSIS — Z01818 Encounter for other preprocedural examination: Secondary | ICD-10-CM | POA: Diagnosis not present

## 2022-11-03 DIAGNOSIS — D62 Acute posthemorrhagic anemia: Secondary | ICD-10-CM | POA: Diagnosis not present

## 2022-11-03 DIAGNOSIS — E785 Hyperlipidemia, unspecified: Secondary | ICD-10-CM | POA: Diagnosis not present

## 2022-11-03 DIAGNOSIS — S72001A Fracture of unspecified part of neck of right femur, initial encounter for closed fracture: Principal | ICD-10-CM

## 2022-11-03 DIAGNOSIS — E0590 Thyrotoxicosis, unspecified without thyrotoxic crisis or storm: Secondary | ICD-10-CM | POA: Diagnosis present

## 2022-11-03 DIAGNOSIS — Z79890 Hormone replacement therapy (postmenopausal): Secondary | ICD-10-CM

## 2022-11-03 DIAGNOSIS — R110 Nausea: Secondary | ICD-10-CM | POA: Diagnosis not present

## 2022-11-03 DIAGNOSIS — M7989 Other specified soft tissue disorders: Secondary | ICD-10-CM | POA: Diagnosis not present

## 2022-11-03 DIAGNOSIS — G4700 Insomnia, unspecified: Secondary | ICD-10-CM | POA: Diagnosis present

## 2022-11-03 DIAGNOSIS — Z471 Aftercare following joint replacement surgery: Secondary | ICD-10-CM | POA: Diagnosis not present

## 2022-11-03 DIAGNOSIS — S79911A Unspecified injury of right hip, initial encounter: Secondary | ICD-10-CM | POA: Diagnosis not present

## 2022-11-03 DIAGNOSIS — K5903 Drug induced constipation: Secondary | ICD-10-CM | POA: Diagnosis not present

## 2022-11-03 DIAGNOSIS — W1809XA Striking against other object with subsequent fall, initial encounter: Secondary | ICD-10-CM | POA: Diagnosis present

## 2022-11-03 DIAGNOSIS — W19XXXA Unspecified fall, initial encounter: Secondary | ICD-10-CM | POA: Diagnosis not present

## 2022-11-03 DIAGNOSIS — S72001D Fracture of unspecified part of neck of right femur, subsequent encounter for closed fracture with routine healing: Secondary | ICD-10-CM | POA: Diagnosis not present

## 2022-11-03 LAB — CBC WITH DIFFERENTIAL/PLATELET
Abs Immature Granulocytes: 0.06 10*3/uL (ref 0.00–0.07)
Basophils Absolute: 0.1 10*3/uL (ref 0.0–0.1)
Basophils Relative: 1 %
Eosinophils Absolute: 0 10*3/uL (ref 0.0–0.5)
Eosinophils Relative: 0 %
HCT: 33.4 % — ABNORMAL LOW (ref 36.0–46.0)
Hemoglobin: 11.1 g/dL — ABNORMAL LOW (ref 12.0–15.0)
Immature Granulocytes: 1 %
Lymphocytes Relative: 14 %
Lymphs Abs: 1.6 10*3/uL (ref 0.7–4.0)
MCH: 31.7 pg (ref 26.0–34.0)
MCHC: 33.2 g/dL (ref 30.0–36.0)
MCV: 95.4 fL (ref 80.0–100.0)
Monocytes Absolute: 0.5 10*3/uL (ref 0.1–1.0)
Monocytes Relative: 5 %
Neutro Abs: 9.1 10*3/uL — ABNORMAL HIGH (ref 1.7–7.7)
Neutrophils Relative %: 79 %
Platelets: 159 10*3/uL (ref 150–400)
RBC: 3.5 MIL/uL — ABNORMAL LOW (ref 3.87–5.11)
RDW: 12.5 % (ref 11.5–15.5)
WBC: 11.4 10*3/uL — ABNORMAL HIGH (ref 4.0–10.5)
nRBC: 0 % (ref 0.0–0.2)

## 2022-11-03 LAB — BASIC METABOLIC PANEL
Anion gap: 13 (ref 5–15)
BUN: 19 mg/dL (ref 8–23)
CO2: 25 mmol/L (ref 22–32)
Calcium: 9.1 mg/dL (ref 8.9–10.3)
Chloride: 102 mmol/L (ref 98–111)
Creatinine, Ser: 0.84 mg/dL (ref 0.44–1.00)
GFR, Estimated: >60 mL/min (ref >60)
Glucose, Bld: 144 mg/dL — ABNORMAL HIGH (ref 70–99)
Potassium: 3.9 mmol/L (ref 3.5–5.1)
Sodium: 140 mmol/L (ref 135–145)

## 2022-11-03 LAB — PROTIME-INR
INR: 1 (ref 0.8–1.2)
Prothrombin Time: 13.5 seconds (ref 11.4–15.2)

## 2022-11-03 MED ORDER — SODIUM CHLORIDE 0.9 % IV BOLUS
1000.0000 mL | Freq: Once | INTRAVENOUS | Status: AC
  Administered 2022-11-03: 1000 mL via INTRAVENOUS

## 2022-11-03 MED ORDER — FENTANYL CITRATE PF 50 MCG/ML IJ SOSY
50.0000 ug | PREFILLED_SYRINGE | INTRAMUSCULAR | Status: AC | PRN
  Administered 2022-11-03 – 2022-11-04 (×2): 50 ug via INTRAVENOUS
  Filled 2022-11-03 (×2): qty 1

## 2022-11-03 MED ORDER — ONDANSETRON HCL 4 MG/2ML IJ SOLN
4.0000 mg | Freq: Once | INTRAMUSCULAR | Status: AC
  Administered 2022-11-03: 4 mg via INTRAVENOUS
  Filled 2022-11-03: qty 2

## 2022-11-03 NOTE — ED Provider Notes (Incomplete)
[facility] Provider Note   CSN: [id number] Arrival date & time: 11/03/22  2241     History {Add pertinent medical, surgical, social history, OB history to HPI:1} Chief Complaint  Patient presents with  . Hip Pain    Dominique Livingston is a 89 y.o. female.  The history is provided by the patient, the EMS personnel and medical records. No language interpreter was used.  Hip Pain     89 year old female with significant history of hypertension, polymyalgia rheumatica, hypothyroidism brought here via EMS from home for evaluation of a fall.  Patient reports tonight she was getting up from her chair, to walk when her right foot got caught on the carpet causing her to fall.  Patient's fell on the right side and struck her right hip and injured her right ankle from the fall.  She did not hit her head and denies any loss of consciousness.  She was unable to get up on her own and she spent the next hour dragging herself towards the door to open the door as well as striking self back to the room in order to call her son who came by and EMS was contacted to bring her here.  Patient endorsed pain primarily to her right hip and her right foot/ankle.  Pain is throbbing and achy and rates as 5 out of 10.  Patient did receive 75 mcg of fentanyl and 4 mg Zofran on route.  She denies any precipitating symptoms prior to the fall.  She denies any headache, neck pain, chest pain, trouble breathing, abdominal pain, or back pain.  She denies any upper extremity pain no numbness and no weakness.  She is not on any blood thinner medication.  No urinary symptoms.  Home Medications Prior to Admission medications   Medication Sig Start Date End Date Taking? Authorizing Provider  ALPRAZolam (XANAX) 0.25 MG tablet Take 1 tablet (0.25 mg total) by mouth 2 (two) times daily as needed. for anxiety Patient taking differently: Take 0.25 mg by mouth 2 (two) times daily as needed for  anxiety. 12/06/21   Elby Showers, MD  cetirizine (ZYRTEC) 10 MG tablet Chew 10 mg by mouth daily.    [provider]  dicyclomine (BENTYL) 10 MG capsule Take 1 capsule (10 mg total) by mouth 4 (four) times daily -  before meals and at bedtime. 04/14/22 05/14/22  Vanna Scotland, MD  ezetimibe (ZETIA) 10 MG tablet Take 1 tablet by mouth once daily Patient not taking: Reported on 07/07/2022 03/14/22   Elby Showers, MD  levothyroxine (SYNTHROID) 75 MCG tablet Take 1 tablet by mouth once daily 09/08/22   Elby Showers, MD  lisinopril (ZESTRIL) 5 MG tablet Take 1 tablet by mouth once daily 08/18/22   Elby Showers, MD  Multiple Vitamin (MULTIVITAMIN) tablet Take 1 tablet by mouth daily.    [provider]  ondansetron (ZOFRAN) 4 MG tablet Take 4 mg by mouth every 8 (eight) hours as needed for nausea or vomiting. Patient not taking: Reported on 07/07/2022    [provider]  Polyethyl Glycol-Propyl Glycol (SYSTANE ULTRA OP) Place 1 drop into both eyes 2 (two) times daily.    [provider]  psyllium (METAMUCIL SMOOTH TEXTURE) 28 % packet Take 1 packet by mouth 2 (two) times daily.    [provider]  Hyoscyamine Sulfate SL (LEVSIN/SL) 0.125 MG SUBL One tab sublingual  one half hour before eating 05/23/18 04/10/20  Elby Showers, MD      Allergies    Amoxicillin-pot clavulanate, Erythromycin, Mushroom extract complex, and Macrobid [nitrofurantoin monohyd macro]    Review of Systems   Review of Systems  All other systems reviewed and are negative.   Physical Exam Updated Vital Signs SpO2 100%  Physical Exam Vitals and nursing note reviewed.  Constitutional:      General: She is not in acute distress.    Appearance: She is well-developed.     Comments: Elderly female laying in bed appears to be in no acute acute discomfort.  She is answering questions appropriately.  HENT:     Head: Atraumatic.     Mouth/Throat:     Mouth: Mucous membranes are dry.   Eyes:     Extraocular Movements: Extraocular movements intact.     Conjunctiva/sclera: Conjunctivae normal.     Pupils: Pupils are equal, round, and reactive to light.  Neck:     Comments: No midline spine tenderness Cardiovascular:     Rate and Rhythm: Normal rate and regular rhythm.     Pulses: Normal pulses.     Heart sounds: Normal heart sounds.  Pulmonary:     Effort: Pulmonary effort is normal.     Breath sounds: No wheezing, rhonchi or rales.  Abdominal:     Palpations: Abdomen is soft.     Tenderness: There is no abdominal tenderness.  Musculoskeletal:        General: Tenderness (Right hip: Tenderness to the hip with decreased range of motion.  Right leg is shortened.) present.     Cervical back: Normal range of motion and neck supple.     Comments: Right ankle with tenderness to palpation to the lateral malleoli region and associated ecchymosis noted.  Intact dorsalis pedis pulse with brisk cap refill.  Tenderness to anterior proximal foot.  Skin:    Findings: No rash.  Neurological:     Mental Status: She is alert and oriented to person, place, and time.  Psychiatric:        Mood and Affect: Mood normal.     ED Results / Procedures / Treatments   Labs (all labs ordered are listed, but only abnormal results are displayed) Labs Reviewed - No data to display  EKG None  Radiology No results found.  Procedures Procedures  {Document cardiac monitor, telemetry assessment procedure when appropriate:1}  Medications Ordered in ED Medications - No data to display  ED Course/ Medical Decision Making/ A&P   {   Click here for ABCD2, HEART and other calculatorsREFRESH Note before signing :1}                          Medical Decision Making  BP (!) 141/37   Pulse 69   Resp 16   SpO2 98%   55:19 PM 89 year old female with significant history of hypertension, polymyalgia rheumatica, hypothyroidism brought here via EMS from home for evaluation of a fall.  Patient  reports tonight she was getting up from her chair, to walk when her right foot got caught on the carpet causing her to fall.  Patient's fell on the right side and struck her right hip and injured her right ankle from the fall.  She did not hit her head and denies any loss of consciousness.  She was unable to get up on her own and she spent the next hour dragging herself towards the door to open the door as well as  striking self back to the room in order to call her son who came by and EMS was contacted to bring her here.  Patient endorsed pain primarily to her right hip and her right foot/ankle.  Pain is throbbing and achy and rates as 5 out of 10.  Patient did receive 75 mcg of fentanyl and 4 mg Zofran on route.  She denies any precipitating symptoms prior to the fall.  She denies any headache, neck pain, chest pain, trouble breathing, abdominal pain, or back pain.  She denies any upper extremity pain no numbness and no weakness.  She is not on any blood thinner medication.  No urinary symptoms.  On exam this is an elderly female laying in bed appears to be in no acute discomfort.  She is alert and oriented x 4 and answering questions appropriately.  She does not have any focal neurodeficit.  Her mouth is dry.  Heart with normal rate and rhythm, lungs are clear to auscultation bilaterally abdomen soft nontender, no midline spine tenderness, no tenderness to her upper extremities.  She does have tenderness to her right hip with decreased range of motion in her right leg is shortened.  She has an ecchymosis to her right ankle with tenderness about the ankle and proximal foot.  She has intact distal pulses.  -Labs ordered, independently viewed and interpreted by me.  Labs remarkable for *** -The patient was maintained on a cardiac monitor.  I personally viewed and interpreted the cardiac monitored which showed an underlying rhythm of: *** -Imaging independently viewed and interpreted by me and I agree with  radiologist's interpretation.  Result remarkable for *** -This patient presents to the ED for concern of ***, this involves an extensive number of treatment options, and is a complaint that carries with it a high risk of complications and morbidity.  The differential diagnosis includes *** -Co morbidities that complicate the patient evaluation includes *** -Treatment includes *** -Reevaluation of the patient after these medicines showed that the patient {resolved/improved/worsened:23923::"improved"} -PCP office notes or outside notes reviewed -Discussion with specialist *** -Escalation to admission/observation considered: patients feels much better, is comfortable with discharge, and will follow up with PCP -Prescription medication considered, patient comfortable with *** -Social Determinant of Health considered which includes ***  11:38 PM   {Document critical care time when appropriate:1} {Document review of labs and clinical decision tools ie heart score, Chads2Vasc2 etc:1}  {Document your independent review of radiology images, and any outside records:1} {Document your discussion with family members, caretakers, and with consultants:1} {Document social determinants of health affecting pt's care:1} {Document your decision making why or why not admission, treatments were needed:1} Final Clinical Impression(s) / ED Diagnoses Final diagnoses:  None    Rx / DC Orders ED Discharge Orders     None

## 2022-11-03 NOTE — ED Provider Notes (Signed)
Dominique Livingston Note   CSN: [id number] Arrival date & time: 11/03/22  2241     History  Chief Complaint  Patient presents with   Hip Pain    Dominique Livingston is a 89 y.o. female.  The history is provided by the patient, the EMS personnel and medical records. No language interpreter was used.  Hip Pain     89 year old female with significant history of hypertension, polymyalgia rheumatica, hypothyroidism brought here via EMS from home for evaluation of a fall.  Patient reports tonight she was getting up from her chair, to walk when her right foot got caught on the carpet causing her to fall.  Patient's fell on the right side and struck her right hip and injured her right ankle from the fall.  She did not hit her head and denies any loss of consciousness.  She was unable to get up on her own and she spent the next hour dragging herself towards the door to open the door as well as striking self back to the room in order to call her son who came by and EMS was contacted to bring her here.  Patient endorsed pain primarily to her right hip and her right foot/ankle.  Pain is throbbing and achy and rates as 5 out of 10.  Patient did receive 75 mcg of fentanyl and 4 mg Zofran on route.  She denies any precipitating symptoms prior to the fall.  She denies any headache, neck pain, chest pain, trouble breathing, abdominal pain, or back pain.  She denies any upper extremity pain no numbness and no weakness.  She is not on any blood thinner medication.  No urinary symptoms.  Home Medications Prior to Admission medications   Medication Sig Start Date End Date Taking? Authorizing Livingston  ALPRAZolam (XANAX) 0.25 MG tablet Take 1 tablet (0.25 mg total) by mouth 2 (two) times daily as needed. for anxiety Patient taking differently: Take 0.25 mg by mouth 2 (two) times daily as needed for anxiety. 12/06/21   Elby Showers, MD  cetirizine (ZYRTEC) 10 MG tablet  Chew 10 mg by mouth daily.    Livingston, Historical, MD  dicyclomine (BENTYL) 10 MG capsule Take 1 capsule (10 mg total) by mouth 4 (four) times daily -  before meals and at bedtime. 04/14/22 05/14/22  Vanna Scotland, MD  ezetimibe (ZETIA) 10 MG tablet Take 1 tablet by mouth once daily Patient not taking: Reported on 07/07/2022 03/14/22   Elby Showers, MD  levothyroxine (SYNTHROID) 75 MCG tablet Take 1 tablet by mouth once daily 09/08/22   Elby Showers, MD  lisinopril (ZESTRIL) 5 MG tablet Take 1 tablet by mouth once daily 08/18/22   Elby Showers, MD  Multiple Vitamin (MULTIVITAMIN) tablet Take 1 tablet by mouth daily.    Livingston, Historical, MD  ondansetron (ZOFRAN) 4 MG tablet Take 4 mg by mouth every 8 (eight) hours as needed for nausea or vomiting. Patient not taking: Reported on 07/07/2022    Livingston, Historical, MD  Polyethyl Glycol-Propyl Glycol (SYSTANE ULTRA OP) Place 1 drop into both eyes 2 (two) times daily.    Livingston, Historical, MD  psyllium (METAMUCIL SMOOTH TEXTURE) 28 % packet Take 1 packet by mouth 2 (two) times daily.    Livingston, Historical, MD  Hyoscyamine Sulfate SL (LEVSIN/SL) 0.125 MG SUBL One tab sublingual  one half hour before eating 05/23/18 04/10/20  Elby Showers, MD  Allergies    Amoxicillin-pot clavulanate, Erythromycin, Mushroom extract complex, and Macrobid [nitrofurantoin monohyd macro]    Review of Systems   Review of Systems  All other systems reviewed and are negative.   Physical Exam Updated Vital Signs SpO2 100%  Physical Exam Vitals and nursing note reviewed.  Constitutional:      General: She is not in acute distress.    Appearance: She is well-developed.     Comments: Elderly female laying in bed appears to be in no acute acute discomfort.  She is answering questions appropriately.  HENT:     Head: Atraumatic.     Mouth/Throat:     Mouth: Mucous membranes are dry.  Eyes:     Extraocular Movements: Extraocular movements intact.      Conjunctiva/sclera: Conjunctivae normal.     Pupils: Pupils are equal, round, and reactive to light.  Neck:     Comments: No midline spine tenderness Cardiovascular:     Rate and Rhythm: Normal rate and regular rhythm.     Pulses: Normal pulses.     Heart sounds: Normal heart sounds.  Pulmonary:     Effort: Pulmonary effort is normal.     Breath sounds: No wheezing, rhonchi or rales.  Abdominal:     Palpations: Abdomen is soft.     Tenderness: There is no abdominal tenderness.  Musculoskeletal:        General: Tenderness (Right hip: Tenderness to the hip with decreased range of motion.  Right leg is shortened.) present.     Cervical back: Normal range of motion and neck supple.     Comments: Right ankle with tenderness to palpation to the lateral malleoli region and associated ecchymosis noted.  Intact dorsalis pedis pulse with brisk cap refill.  Tenderness to anterior proximal foot.  Skin:    Findings: No rash.  Neurological:     Mental Status: She is alert and oriented to person, place, and time.  Psychiatric:        Mood and Affect: Mood normal.     ED Results / Procedures / Treatments   Labs (all labs ordered are listed, but only abnormal results are displayed) Labs Reviewed  BASIC METABOLIC PANEL - Abnormal; Notable for the following components:      Result Value   Glucose, Bld 144 (*)    All other components within normal limits  CBC WITH DIFFERENTIAL/PLATELET - Abnormal; Notable for the following components:   WBC 11.4 (*)    RBC 3.50 (*)    Hemoglobin 11.1 (*)    HCT 33.4 (*)    Neutro Abs 9.1 (*)    All other components within normal limits  PROTIME-INR  TYPE AND SCREEN  ABO/RH    EKG None  Radiology DG Hip Unilat With Pelvis 2-3 Views Right  Result Date: 11/03/2022 CLINICAL DATA:  Fall, hip injury. EXAM: RIGHT ANKLE - COMPLETE 3+ VIEW; DG HIP (WITH OR WITHOUT PELVIS) 2-3V RIGHT COMPARISON:  None Available. FINDINGS: There is a fracture involving the  femoral neck on the right with superior subluxation of the distal fracture fragment. No dislocation. The remaining bony structures are intact. The soft tissues are unremarkable. IMPRESSION: Mildly displaced femoral neck fracture on the right. Electronically Signed   By: Thornell Sartorius M.D.   On: 11/03/2022 23:51   DG Ankle Complete Right  Result Date: 11/03/2022 CLINICAL DATA:  Fall, hip injury. EXAM: RIGHT ANKLE - COMPLETE 3+ VIEW; DG HIP (WITH OR WITHOUT PELVIS) 2-3V RIGHT COMPARISON:  None  Available. FINDINGS: There is a fracture involving the femoral neck on the right with superior subluxation of the distal fracture fragment. No dislocation. The remaining bony structures are intact. The soft tissues are unremarkable. IMPRESSION: Mildly displaced femoral neck fracture on the right. Electronically Signed   By: Brett Fairy M.D.   On: 11/03/2022 23:51   DG Chest Portable 1 View  Result Date: 11/03/2022 CLINICAL DATA:  [place], hip fracture, preoperative evaluation EXAM: PORTABLE CHEST 1 VIEW COMPARISON:  08/15/2013 FINDINGS: Single frontal view of the chest demonstrates an unremarkable cardiac silhouette. No airspace disease, effusion, or pneumothorax. No acute bony abnormality. IMPRESSION: 1. No acute intrathoracic process. Electronically Signed   By: Randa Ngo M.D.   On: 11/03/2022 23:45    Procedures Procedures    Medications Ordered in ED Medications  fentaNYL (SUBLIMAZE) injection 50 mcg (50 mcg Intravenous Given 11/03/22 2347)  morphine (PF) 4 MG/ML injection 4 mg (has no administration in time range)  ondansetron (ZOFRAN) injection 4 mg (4 mg Intravenous Given 11/03/22 2347)  sodium chloride 0.9 % bolus 1,000 mL (0 mLs Intravenous Stopped 11/04/22 0035)    ED Course/ Medical Decision Making/ A&P                             Medical Decision Making  BP (!) 124/57   Pulse 73   Resp 14   SpO2 100%   9:33 PM 89 year old female with significant history of hypertension, polymyalgia  rheumatica, hypothyroidism brought here via EMS from home for evaluation of a fall.  Patient reports tonight she was getting up from her chair, to walk when her right foot got caught on the carpet causing her to fall.  Patient's fell on the right side and struck her right hip and injured her right ankle from the fall.  She did not hit her head and denies any loss of consciousness.  She was unable to get up on her own and she spent the next hour dragging herself towards the door to open the door as well as striking self back to the room in order to call her son who came by and EMS was contacted to bring her here.  Patient endorsed pain primarily to her right hip and her right foot/ankle.  Pain is throbbing and achy and rates as 5 out of 10.  Patient did receive 75 mcg of fentanyl and 4 mg Zofran on route.  She denies any precipitating symptoms prior to the fall.  She denies any headache, neck pain, chest pain, trouble breathing, abdominal pain, or back pain.  She denies any upper extremity pain no numbness and no weakness.  She is not on any blood thinner medication.  No urinary symptoms.  On exam this is an elderly female laying in bed appears to be in no acute discomfort.  She is alert and oriented x 4 and answering questions appropriately.  She does not have any focal neurodeficit.  Her mouth is dry.  Heart with normal rate and rhythm, lungs are clear to auscultation bilaterally abdomen soft nontender, no midline spine tenderness, no tenderness to her upper extremities.  She does have tenderness to her right hip with decreased range of motion in her right leg is shortened.  She has an ecchymosis to her right ankle with tenderness about the ankle and proximal foot.  She has intact distal pulses.  -Labs ordered, independently viewed and interpreted by me.  Labs remarkable for mildly  elevated WBC of 11.4 likely 2/2 stress.  -The patient was maintained on a cardiac monitor.  I personally viewed and interpreted  the cardiac monitored which showed an underlying rhythm of: NSR -Imaging independently viewed and interpreted by me and I agree with radiologist's interpretation.  Result remarkable for R hip xray showing mildly displaced femoral neck fracture on the right -This patient presents to the ED for concern of fall, this involves an extensive number of treatment options, and is a complaint that carries with it a high risk of complications and morbidity.  The differential diagnosis includes hip fx, hip dislocation, hip sprain, ankle sprain, ankle fx,  -Co morbidities that complicate the patient evaluation includes HTN, HLD, PR, thyroid disease -Treatment includes morphine, fentanyl -Reevaluation of the patient after these medicines showed that the patient improved -PCP office notes or outside notes reviewed -Discussion with specialist orthopedist Dr. Doran Durand who agrees with medicine admission and surgical management -Escalation to admission/observation considered: patient is in agreement with admission  11:38 PM Patient mechanical fall and landed on her right hip.  She denies hitting her head or loss of consciousness.  She also injured her right ankle.  Workup today remarkable for a right femoral neck fracture on x-ray.  This is a closed injury.  She is neurovascular intact.  Right ankle without acute fracture or dislocation.  This is likely an ankle sprain, ASO brace provided.  I have consulted orthopedist and spoke with Dr. Doran Durand who request patient to be admitted by medicine, n.p.o. at midnight, and no anticoagulant as patient may have her surgery tomorrow.  I appreciate consultation from [facility] hospitalist, Dr. Marcello Moores who agrees to see and will admit patient for further care.  Please note that the impression on the right ankle x-ray is incorrect.  I have notified radiologist who will correct the impression as appropriate.        Final Clinical Impression(s) / ED Diagnoses Final diagnoses:  Closed  fracture of neck of right femur, initial encounter ([place])  Sprain of anterior talofibular ligament of right ankle, initial encounter    Rx / DC Orders ED Discharge Orders     None         Domenic Moras, PA-C 11/04/22 0048    Palumbo, April, MD 11/04/22 [phone]

## 2022-11-03 NOTE — ED Triage Notes (Addendum)
Pt BIB [facility] from home after fall from standing when her foot got caught on the carpet and she fell on her right hip. Patient was on the ground for ~1hr because she couldn't reach a phone. Patient denies LOC, no head injury. EMS reports shortening and rotation to right leg. VSS, NAD. 14mcg fentanyl and 4mg  zofran given en route. Patient reports pain at 5/10.

## 2022-11-04 ENCOUNTER — Inpatient Hospital Stay (HOSPITAL_COMMUNITY): Payer: Medicare Other | Admitting: Anesthesiology

## 2022-11-04 DIAGNOSIS — R3915 Urgency of urination: Secondary | ICD-10-CM | POA: Diagnosis present

## 2022-11-04 DIAGNOSIS — F419 Anxiety disorder, unspecified: Secondary | ICD-10-CM | POA: Diagnosis present

## 2022-11-04 DIAGNOSIS — Z96641 Presence of right artificial hip joint: Secondary | ICD-10-CM | POA: Diagnosis present

## 2022-11-04 DIAGNOSIS — E039 Hypothyroidism, unspecified: Secondary | ICD-10-CM | POA: Diagnosis present

## 2022-11-04 DIAGNOSIS — W19XXXA Unspecified fall, initial encounter: Secondary | ICD-10-CM | POA: Diagnosis not present

## 2022-11-04 DIAGNOSIS — Z471 Aftercare following joint replacement surgery: Secondary | ICD-10-CM | POA: Diagnosis present

## 2022-11-04 DIAGNOSIS — K5903 Drug induced constipation: Secondary | ICD-10-CM | POA: Diagnosis not present

## 2022-11-04 DIAGNOSIS — S72001D Fracture of unspecified part of neck of right femur, subsequent encounter for closed fracture with routine healing: Secondary | ICD-10-CM | POA: Diagnosis not present

## 2022-11-04 DIAGNOSIS — S72001A Fracture of unspecified part of neck of right femur, initial encounter for closed fracture: Secondary | ICD-10-CM

## 2022-11-04 DIAGNOSIS — M7989 Other specified soft tissue disorders: Secondary | ICD-10-CM | POA: Diagnosis not present

## 2022-11-04 DIAGNOSIS — Z888 Allergy status to other drugs, medicaments and biological substances status: Secondary | ICD-10-CM | POA: Diagnosis not present

## 2022-11-04 DIAGNOSIS — E785 Hyperlipidemia, unspecified: Secondary | ICD-10-CM | POA: Diagnosis present

## 2022-11-04 DIAGNOSIS — M25551 Pain in right hip: Secondary | ICD-10-CM | POA: Diagnosis present

## 2022-11-04 DIAGNOSIS — Z9049 Acquired absence of other specified parts of digestive tract: Secondary | ICD-10-CM | POA: Diagnosis not present

## 2022-11-04 DIAGNOSIS — S72401A Unspecified fracture of lower end of right femur, initial encounter for closed fracture: Secondary | ICD-10-CM | POA: Diagnosis present

## 2022-11-04 DIAGNOSIS — E0590 Thyrotoxicosis, unspecified without thyrotoxic crisis or storm: Secondary | ICD-10-CM | POA: Diagnosis present

## 2022-11-04 DIAGNOSIS — Z91018 Allergy to other foods: Secondary | ICD-10-CM | POA: Diagnosis not present

## 2022-11-04 DIAGNOSIS — S72009A Fracture of unspecified part of neck of unspecified femur, initial encounter for closed fracture: Secondary | ICD-10-CM | POA: Diagnosis present

## 2022-11-04 DIAGNOSIS — I10 Essential (primary) hypertension: Secondary | ICD-10-CM | POA: Diagnosis present

## 2022-11-04 DIAGNOSIS — S93491A Sprain of other ligament of right ankle, initial encounter: Secondary | ICD-10-CM | POA: Diagnosis present

## 2022-11-04 DIAGNOSIS — G8911 Acute pain due to trauma: Secondary | ICD-10-CM | POA: Diagnosis not present

## 2022-11-04 DIAGNOSIS — S82891A Other fracture of right lower leg, initial encounter for closed fracture: Secondary | ICD-10-CM | POA: Diagnosis not present

## 2022-11-04 DIAGNOSIS — Z825 Family history of asthma and other chronic lower respiratory diseases: Secondary | ICD-10-CM | POA: Diagnosis not present

## 2022-11-04 DIAGNOSIS — G4700 Insomnia, unspecified: Secondary | ICD-10-CM | POA: Diagnosis present

## 2022-11-04 DIAGNOSIS — S72011A Unspecified intracapsular fracture of right femur, initial encounter for closed fracture: Secondary | ICD-10-CM | POA: Diagnosis not present

## 2022-11-04 DIAGNOSIS — Z8249 Family history of ischemic heart disease and other diseases of the circulatory system: Secondary | ICD-10-CM | POA: Diagnosis not present

## 2022-11-04 DIAGNOSIS — Z9842 Cataract extraction status, left eye: Secondary | ICD-10-CM | POA: Diagnosis not present

## 2022-11-04 DIAGNOSIS — W1809XA Striking against other object with subsequent fall, initial encounter: Secondary | ICD-10-CM | POA: Diagnosis present

## 2022-11-04 DIAGNOSIS — K589 Irritable bowel syndrome without diarrhea: Secondary | ICD-10-CM | POA: Diagnosis present

## 2022-11-04 DIAGNOSIS — K219 Gastro-esophageal reflux disease without esophagitis: Secondary | ICD-10-CM | POA: Diagnosis present

## 2022-11-04 DIAGNOSIS — W1809XD Striking against other object with subsequent fall, subsequent encounter: Secondary | ICD-10-CM | POA: Diagnosis not present

## 2022-11-04 DIAGNOSIS — S72002D Fracture of unspecified part of neck of left femur, subsequent encounter for closed fracture with routine healing: Secondary | ICD-10-CM

## 2022-11-04 DIAGNOSIS — Z880 Allergy status to penicillin: Secondary | ICD-10-CM | POA: Diagnosis not present

## 2022-11-04 DIAGNOSIS — Z79890 Hormone replacement therapy (postmenopausal): Secondary | ICD-10-CM | POA: Diagnosis not present

## 2022-11-04 DIAGNOSIS — S93401A Sprain of unspecified ligament of right ankle, initial encounter: Secondary | ICD-10-CM | POA: Diagnosis not present

## 2022-11-04 DIAGNOSIS — D696 Thrombocytopenia, unspecified: Secondary | ICD-10-CM | POA: Diagnosis not present

## 2022-11-04 DIAGNOSIS — S93401D Sprain of unspecified ligament of right ankle, subsequent encounter: Secondary | ICD-10-CM | POA: Diagnosis not present

## 2022-11-04 DIAGNOSIS — Z9841 Cataract extraction status, right eye: Secondary | ICD-10-CM | POA: Diagnosis not present

## 2022-11-04 DIAGNOSIS — S82891D Other fracture of right lower leg, subsequent encounter for closed fracture with routine healing: Secondary | ICD-10-CM | POA: Diagnosis not present

## 2022-11-04 DIAGNOSIS — D62 Acute posthemorrhagic anemia: Secondary | ICD-10-CM | POA: Diagnosis present

## 2022-11-04 DIAGNOSIS — Y92009 Unspecified place in unspecified non-institutional (private) residence as the place of occurrence of the external cause: Secondary | ICD-10-CM | POA: Diagnosis not present

## 2022-11-04 DIAGNOSIS — M353 Polymyalgia rheumatica: Secondary | ICD-10-CM | POA: Diagnosis present

## 2022-11-04 DIAGNOSIS — Z90710 Acquired absence of both cervix and uterus: Secondary | ICD-10-CM | POA: Diagnosis not present

## 2022-11-04 DIAGNOSIS — Z91040 Latex allergy status: Secondary | ICD-10-CM | POA: Diagnosis not present

## 2022-11-04 DIAGNOSIS — Z8719 Personal history of other diseases of the digestive system: Secondary | ICD-10-CM | POA: Diagnosis not present

## 2022-11-04 DIAGNOSIS — Z79899 Other long term (current) drug therapy: Secondary | ICD-10-CM | POA: Diagnosis not present

## 2022-11-04 LAB — CBC
HCT: 32.5 % — ABNORMAL LOW (ref 36.0–46.0)
Hemoglobin: 10.9 g/dL — ABNORMAL LOW (ref 12.0–15.0)
MCH: 31.6 pg (ref 26.0–34.0)
MCHC: 33.5 g/dL (ref 30.0–36.0)
MCV: 94.2 fL (ref 80.0–100.0)
Platelets: 148 10*3/uL — ABNORMAL LOW (ref 150–400)
RBC: 3.45 MIL/uL — ABNORMAL LOW (ref 3.87–5.11)
RDW: 12.5 % (ref 11.5–15.5)
WBC: 8.8 10*3/uL (ref 4.0–10.5)
nRBC: 0 % (ref 0.0–0.2)

## 2022-11-04 LAB — ABO/RH: ABO/RH(D): A POS

## 2022-11-04 LAB — BASIC METABOLIC PANEL
Anion gap: 8 (ref 5–15)
BUN: 15 mg/dL (ref 8–23)
CO2: 25 mmol/L (ref 22–32)
Calcium: 8.8 mg/dL — ABNORMAL LOW (ref 8.9–10.3)
Chloride: 106 mmol/L (ref 98–111)
Creatinine, Ser: 0.87 mg/dL (ref 0.44–1.00)
GFR, Estimated: >60 mL/min (ref >60)
Glucose, Bld: 135 mg/dL — ABNORMAL HIGH (ref 70–99)
Potassium: 4.4 mmol/L (ref 3.5–5.1)
Sodium: 139 mmol/L (ref 135–145)

## 2022-11-04 MED ORDER — ALPRAZOLAM 0.25 MG PO TABS
0.2500 mg | ORAL_TABLET | Freq: Two times a day (BID) | ORAL | Status: DC | PRN
  Administered 2022-11-04: 0.25 mg via ORAL
  Filled 2022-11-04: qty 1

## 2022-11-04 MED ORDER — ADULT MULTIVITAMIN W/MINERALS CH: 1.0000 | ORAL_TABLET | Freq: Every day | ORAL | Status: DC

## 2022-11-04 MED ORDER — LISINOPRIL 5 MG PO TABS: 5.0000 mg | ORAL_TABLET | Freq: Every day | ORAL | Status: DC

## 2022-11-04 MED ORDER — ENSURE ENLIVE PO LIQD: 237.0000 mL | Freq: Two times a day (BID) | ORAL | Status: DC

## 2022-11-04 MED ORDER — DEXAMETHASONE SODIUM PHOSPHATE 4 MG/ML IJ SOLN
INTRAMUSCULAR | Status: DC | PRN
  Administered 2022-11-04: 5 mg via PERINEURAL

## 2022-11-04 MED ORDER — LEVOTHYROXINE SODIUM 75 MCG PO TABS
75.0000 ug | ORAL_TABLET | Freq: Every day | ORAL | Status: DC
  Administered 2022-11-06 – 2022-11-09 (×4): 75 ug via ORAL
  Filled 2022-11-04 (×4): qty 1

## 2022-11-04 MED ORDER — ROPIVACAINE HCL 5 MG/ML IJ SOLN
INTRAMUSCULAR | Status: DC | PRN
  Administered 2022-11-04: 30 mL via PERINEURAL

## 2022-11-04 MED ORDER — ENOXAPARIN SODIUM 30 MG/0.3ML IJ SOSY
30.0000 mg | PREFILLED_SYRINGE | Freq: Once | INTRAMUSCULAR | Status: AC
  Administered 2022-11-04: 30 mg via SUBCUTANEOUS
  Filled 2022-11-04: qty 0.3

## 2022-11-04 MED ORDER — SODIUM CHLORIDE 0.9 % IV SOLN: INTRAVENOUS | Status: AC

## 2022-11-04 MED ORDER — POLYVINYL ALCOHOL 1.4 % OP SOLN
1.0000 [drp] | Freq: Two times a day (BID) | OPHTHALMIC | Status: DC
  Administered 2022-11-05 – 2022-11-09 (×8): 1 [drp] via OPHTHALMIC
  Filled 2022-11-04 (×2): qty 15

## 2022-11-04 MED ORDER — MORPHINE SULFATE (PF) 2 MG/ML IV SOLN: 0.5000 mg | INTRAVENOUS | Status: DC | PRN

## 2022-11-04 MED ORDER — MORPHINE SULFATE (PF) 4 MG/ML IV SOLN
4.0000 mg | Freq: Once | INTRAVENOUS | Status: AC
  Administered 2022-11-04: 4 mg via INTRAVENOUS
  Filled 2022-11-04: qty 1

## 2022-11-04 MED ORDER — CLONIDINE HCL (ANALGESIA) 100 MCG/ML EP SOLN
EPIDURAL | Status: DC | PRN
  Administered 2022-11-04: 80 ug

## 2022-11-04 MED ORDER — PSYLLIUM 95 % PO PACK
1.0000 | PACK | Freq: Two times a day (BID) | ORAL | Status: DC
  Administered 2022-11-05 – 2022-11-08 (×4): 1 via ORAL
  Filled 2022-11-04 (×12): qty 1

## 2022-11-04 MED ORDER — HYDROCODONE-ACETAMINOPHEN 5-325 MG PO TABS
1.0000 | ORAL_TABLET | Freq: Four times a day (QID) | ORAL | Status: DC | PRN
  Administered 2022-11-04: 2 via ORAL
  Administered 2022-11-04: 1 via ORAL
  Administered 2022-11-04: 2 via ORAL
  Filled 2022-11-04: qty 1
  Filled 2022-11-04 (×2): qty 2

## 2022-11-04 NOTE — Progress Notes (Signed)
OT Cancellation Note  Patient Details Name: Dominique Livingston MRN: [id number] DOB: 08/06/1933   Cancelled Treatment:    Reason Eval/Treat Not Completed: Patient not medically ready. Pt has not had hip surgery as of yet.  [facility] Office [phone]    Almon Register 11/04/2022, 5:54 PM

## 2022-11-04 NOTE — Progress Notes (Signed)
Subjective: Patient admitted this morning, see detailed H&P by Dr Marcello Moores 89 year old female with medical history of hypertension, hypothyroidism, GERD, diverticulosis, endometriosis, s/p remote right hemicolectomy, IBS, hyperlipidemia, colon polyps came to ED with mechanical fall at home after tripping on carpet.  Patient developed right hip pain.  Found to have right hip fracture and right ankle avulsion fracture.   Vitals:   11/04/22 0321 11/04/22 0905  BP: 133/61 (!) 163/51  Pulse: 79 71  Resp:  16  Temp: 98 F (36.7 C) 97.6 F (36.4 C)  SpO2: 97% 99%      A/P Right hip fracture s/p mechanical fall -Orthopedics, Dr. Doran Durand consulted -Plan for surgery as per orthopedics  Right ankle avulsion fracture -Follow-up orthopedics recommendation  Hypertension -Blood pressure well-controlled -Continue lisinopril  Hypothyroidism -Continue Synthroid    Dominique Livingston [facility]

## 2022-11-04 NOTE — Anesthesia Procedure Notes (Signed)
Anesthesia Regional Block: Femoral nerve block   Pre-Anesthetic Checklist: , timeout performed,  Correct Patient, Correct Site, Correct Laterality,  Correct Procedure, Correct Position, site marked,  Risks and benefits discussed,  Surgical consent,  Pre-op evaluation,  At surgeon's request and post-op pain management  Laterality: Lower and Right  Prep: chloraprep       Needles:  Injection technique: Single-shot  Needle Type: Stimulator Needle - 80     Needle Length: 9cm  Needle Gauge: 22     Additional Needles:   Procedures:,,,, ultrasound used (permanent image in chart),,    Narrative:  Start time: 11/04/2022 10:05 AM End time: 11/04/2022 10:25 AM Injection made incrementally with aspirations every 5 mL.  Performed by: Personally  Anesthesiologist: Nolon Nations, MD  Additional Notes: BP cuff, EKG monitors applied. Sedation begun. Femoral artery palpated for location of nerve. After nerve location verified with U/S, anesthetic injected incrementally, slowly, and after negative aspirations under direct u/s guidance. Good perineural spread. Patient tolerated well.

## 2022-11-04 NOTE — Anesthesia Preprocedure Evaluation (Signed)
Anesthesia Evaluation    Reviewed: Allergy & Precautions, H&P , Patient's Chart, lab work & pertinent test results, Unable to perform ROS - Chart review only  Airway        Dental   Pulmonary           Cardiovascular hypertension,      Neuro/Psych    GI/Hepatic ,GERD  ,,  Endo/Other  Hypothyroidism    Renal/GU Renal disease     Musculoskeletal  (+) Arthritis ,    Abdominal   Peds  Hematology   Anesthesia Other Findings HTN..... NSR  Reproductive/Obstetrics                             Anesthesia Physical Anesthesia Plan  ASA: 3  Anesthesia Plan: Regional   Post-op Pain Management:    Induction:   PONV Risk Score and Plan:   Airway Management Planned:   Additional Equipment:   Intra-op Plan:   Post-operative Plan:   Informed Consent: I have reviewed the patients History and Physical, chart, labs and discussed the procedure including the risks, benefits and alternatives for the proposed anesthesia with the patient or authorized representative who has indicated his/her understanding and acceptance.       Plan Discussed with:   Anesthesia Plan Comments:         Anesthesia Quick Evaluation

## 2022-11-04 NOTE — ED Notes (Signed)
Admitting provider at bedside.

## 2022-11-04 NOTE — Progress Notes (Signed)
Orthopedic Tech Progress Note Patient Details:  Dominique Livingston Jun 12, 1933 [id number]  Ortho Devices Type of Ortho Device: ASO Ortho Device/Splint Location: RLE Ortho Device/Splint Interventions: [name] 11/04/2022, 5:52 AM

## 2022-11-04 NOTE — Consult Note (Cosign Needed)
 ORTHOPAEDIC CONSULTATION  REQUESTING PHYSICIAN: Lama, Gagan S, MD  PCP:  Baxley, Mary J, MD  Chief Complaint: Fall,right hip pain  HPI: Dominique Livingston  Darriana L Oran is a 89 y.o. female who complains of right hip pain following a fall on 11/04/2022.  She was standing and her foot caught on the carpet and fell directly on her right hip.  She is on the ground for about 1 hour as she could not reach the phone but was able to unlock the door and crawl her way to a phone.  She was brought in by EMS and admitted to the hospital after x-rays revealed a femoral neck fracture.  Lives at home alone, denies diabetes.  Daughter is in the room at time of exam.  She does have a past medical history of hypertension, hypothyroidism, GERD, diverticulosis.  Past Medical History:  Diagnosis Date   Allergy    Anxiety    Arthritis    hands , stiffness in knees & hips    Colon polyp, hyperplastic    Diverticulosis    Endometriosis    Fibrocystic breast disease    GERD (gastroesophageal reflux disease)    Hyperlipidemia    Hypertension    Hypothyroidism    IBS (irritable bowel syndrome)    Polymyalgia rheumatica (HCC)    Past Surgical History:  Procedure Laterality Date   ABDOMINAL HYSTERECTOMY     APPENDECTOMY     BREAST BIOPSY Right    CATARACT EXTRACTION, BILATERAL Bilateral    cataracts   CHOLECYSTECTOMY N/A 05/09/2016   Procedure: LAPAROSCOPIC CHOLECYSTECTOMY WITH INTRAOPERATIVE CHOLANGIOGRAM;  Surgeon: Todd Rosenbower, MD;  Location: [facility] OR;  Service: General;  Laterality: N/A;   LEFT COLECTOMY     removed about 18 in due to endometriosos   Social History   Socioeconomic History   Marital status: Widowed    Spouse name: Not on file   Number of children: 2   Years of education: Not on file   Highest education level: Not on file  Occupational History   Occupation: retired  Tobacco Use   Smoking status: Never   Smokeless tobacco: Never  Vaping Use   Vaping Use: Never used   Substance and Sexual Activity   Alcohol use: No   Drug use: No   Sexual activity: Not on file  Other Topics Concern   Not on file  Social History Narrative   Not on file   Social Determinants of Health   Financial Resource Strain: Not on file  Food Insecurity: Not on file  Transportation Needs: Not on file  Physical Activity: Not on file  Stress: Not on file  Social Connections: Not on file   Family History  Problem Relation Age of Onset   Cancer Mother        type unknown   Hypertension Mother    Heart disease Father    COPD Father    Hypertension Daughter    Heart disease Son    Heart attack Son    Breast cancer Neg Hx    Allergies  Allergen Reactions   Amoxicillin-Pot Clavulanate Nausea Only   Erythromycin Nausea And Vomiting   Mushroom Extract Complex Other (See Comments)    Cantalope, adding enviromental allergens such as grass & trees Had allergy test   Macrobid [Nitrofurantoin Monohyd Macro] Rash   Zetia [Ezetimibe] Rash   Prior to Admission medications   Medication Sig Start Date End Date Taking? Authorizing Provider  ALPRAZolam (XANAX) 0.25 MG   tablet Take 0.25 mg by mouth 2 (two) times daily as needed for anxiety.   Yes [provider]  levothyroxine (SYNTHROID) 75 MCG tablet Take 1 tablet by mouth once daily Patient taking differently: Take 75 mcg by mouth daily before breakfast. 09/08/22  Yes Baxley, Mary J, MD  lisinopril (ZESTRIL) 5 MG tablet Take 1 tablet by mouth once daily 08/18/22  Yes Baxley, Mary J, MD  Multiple Vitamin (MULTIVITAMIN) tablet Take 1 tablet by mouth daily.   Yes [provider]  Polyethyl Glycol-Propyl Glycol (SYSTANE ULTRA OP) Place 1 drop into both eyes 2 (two) times daily.   Yes [provider]  psyllium (METAMUCIL SMOOTH TEXTURE) 28 % packet Take 1 packet by mouth 2 (two) times daily.   Yes [provider]  Hyoscyamine Sulfate SL (LEVSIN/SL) 0.125 MG SUBL One tab sublingual  one half hour  before eating 05/23/18 04/10/20  Baxley, Mary J, MD   DG Hip Unilat With Pelvis 2-3 Views Right  Addendum Date: 11/04/2022   ADDENDUM REPORT: 11/04/2022 00:46 ADDENDUM: Right ankle: Bony densities are noted inferior to the lateral malleolus suggesting avulsion fractures of indeterminate age. No dislocation. Electronically Signed   By: Laura  Taylor M.D.   On: 11/04/2022 00:46   Result Date: 11/04/2022 CLINICAL DATA:  Fall, hip injury. EXAM: RIGHT ANKLE - COMPLETE 3+ VIEW; DG HIP (WITH OR WITHOUT PELVIS) 2-3V RIGHT COMPARISON:  None Available. FINDINGS: There is a fracture involving the femoral neck on the right with superior subluxation of the distal fracture fragment. No dislocation. The remaining bony structures are intact. The soft tissues are unremarkable. IMPRESSION: Mildly displaced femoral neck fracture on the right. Electronically Signed: By: Laura  Taylor M.D. On: 11/03/2022 23:51   DG Ankle Complete Right  Addendum Date: 11/04/2022   ADDENDUM REPORT: 11/04/2022 00:46 ADDENDUM: Right ankle: Bony densities are noted inferior to the lateral malleolus suggesting avulsion fractures of indeterminate age. No dislocation. Electronically Signed   By: Laura  Taylor M.D.   On: 11/04/2022 00:46   Result Date: 11/04/2022 CLINICAL DATA:  Fall, hip injury. EXAM: RIGHT ANKLE - COMPLETE 3+ VIEW; DG HIP (WITH OR WITHOUT PELVIS) 2-3V RIGHT COMPARISON:  None Available. FINDINGS: There is a fracture involving the femoral neck on the right with superior subluxation of the distal fracture fragment. No dislocation. The remaining bony structures are intact. The soft tissues are unremarkable. IMPRESSION: Mildly displaced femoral neck fracture on the right. Electronically Signed: By: Laura  Taylor M.D. On: 11/03/2022 23:51   DG Chest Portable 1 View  Result Date: 11/03/2022 CLINICAL DATA:  Fell, hip fracture, preoperative evaluation EXAM: PORTABLE CHEST 1 VIEW COMPARISON:  08/15/2013 FINDINGS: Single frontal view of the  chest demonstrates an unremarkable cardiac silhouette. No airspace disease, effusion, or pneumothorax. No acute bony abnormality. IMPRESSION: 1. No acute intrathoracic process. Electronically Signed   By: Michael  Brown M.D.   On: 11/03/2022 23:45    Positive ROS: All other systems have been reviewed and were otherwise negative with the exception of those mentioned in the HPI and as above.  Physical Exam: General: Alert, no acute distress Cardiovascular: No pedal edema Respiratory: No cyanosis, no use of accessory musculature GI: No organomegaly, abdomen is soft and non-tender Skin: No lesions in the area of chief complaint Neurologic: Sensation intact distally Psychiatric: Patient is competent for consent with normal mood and affect Lymphatic: No axillary or cervical lymphadenopathy  MUSCULOSKELETAL:  Right lower extremity:   Pain with logroll, foot in slight external rotation and shortening.    No tenderness to lateral hip.  No tenderness to the knee, minimal tenderness of the calf.  Ecchymosis along the lateral ankle with mild discomfort and tenderness, mild tenderness to the medial malleolus.  Full range of motion.  DP pulse 2+.  Able to wiggle all toes.  Neurovascular intact distally.  No tenderness to the bilateral upper extremities, no tenderness to the left lower extremity.  Assessment: Right femoral neck fracture Right ankle sprain   Plan: Plan for hemiarthroplasty on Sunday or Monday pending surgeon availability.  Discussed risk and benefits, plan with patient and her daughter.  Nonweightbearing to the right lower extremity , recommend ankle pumps, SCDs, pain control.  Will give 1 dose of Lovenox for DVT prophylaxis.,  Hold after that until post op.   Diet placed for today, will tentatively put NPO for midnight until we have confirmation on surgeon availability.   Right ankle sprain - ASO as needed when ambulating , supportive measures.     Faythe Casa,  PA    11/04/2022 1:20 PM  I have seen and examined Ms Selkirk and agree with the above assessment and exam. She has a displaced right femoral neck fracture from a ground level mechanical fall. She is otherwise a healthy independent community ambulator. Treatment options were discussed and we will proceed with a total hip arthroplasty. Discussed in detail with the patient who elects to proceed

## 2022-11-04 NOTE — H&P (Addendum)
History and Physical    Dominique Livingston BMW:[id number] DOB: October 24, 1932 DOA: 11/03/2022  PCP: Elby Showers, MD  Patient coming from: home  I have personally briefly reviewed patient's old medical records in [place]  Chief Complaint: right leg pain   HPI: Dominique Livingston is a 89 y.o. female with medical history significant of  Hypertension, hypothyroidism, GERD,diverticulosis, endometriosis s/p remote right hemicolectomy, IBS, hyperlipidemia, colon polyps who presents to ED BIB EMS s/p mechanical fall at home after tripping on carpet. Patient notes s/p fall she had significant pain on right hip.  Patient also notes she was on floor x 1 hour as she was unable to reach her phone initially.  She notes no head trauma or LOC. In the field EMS noted right leg was shorted and externally rotated. Patient denies any chest pain, fever/chills /n/v/d/dysuria  noted pain is controlled currently.   ED Course:  Afeb, bp 141/37, hr 72, sat 100%  Wbc 11.4, hgb 11.1 (13.2 9/23) Plt 159 Inr 1 NA 140, K 3.9, cl 102, glu 144 cr 0.84 Right hip: femoral neck on the right with superior subluxation of the distal fracture fragment. No dislocation. The remaining bony structures are intact. The soft tissues are unremarkable. Right ankle: Right ankle: Bony densities are noted inferior to the lateral malleolus suggesting avulsion fractures of indeterminate age. No dislocation.  Cxr  NAD Tx fentanly, ns 1000L Review of Systems: As per HPI otherwise 10 point review of systems negative.   Past Medical History:  Diagnosis Date   Allergy    Anxiety    Arthritis    hands , stiffness in knees & hips    Colon polyp, hyperplastic    Diverticulosis    Endometriosis    Fibrocystic breast disease    GERD (gastroesophageal reflux disease)    Hyperlipidemia    Hypertension    Hypothyroidism    IBS (irritable bowel syndrome)    Polymyalgia rheumatica (HCC)     Past Surgical History:  Procedure  Laterality Date   ABDOMINAL HYSTERECTOMY     APPENDECTOMY     BREAST BIOPSY Right    CATARACT EXTRACTION, BILATERAL Bilateral    cataracts   CHOLECYSTECTOMY N/A 05/09/2016   Procedure: LAPAROSCOPIC CHOLECYSTECTOMY WITH INTRAOPERATIVE CHOLANGIOGRAM;  Surgeon: Jackolyn Confer, MD;  Location: [facility];  Service: General;  Laterality: N/A;   LEFT COLECTOMY     removed about 18 in due to endometriosos     reports that she has never smoked. She has never used smokeless tobacco. She reports that she does not drink alcohol and does not use drugs.  Allergies  Allergen Reactions   Amoxicillin-Pot Clavulanate Nausea Only   Erythromycin Nausea And Vomiting   Mushroom Extract Complex Other (See Comments)    Cantalope, adding enviromental allergens such as grass & trees   Macrobid [Nitrofurantoin Monohyd Macro] Rash    Family History  Problem Relation Age of Onset   Cancer Mother        type unknown   Hypertension Mother    Heart disease Father    COPD Father    Hypertension Daughter    Heart disease Son    Heart attack Son    Breast cancer Neg Hx     Prior to Admission medications   Medication Sig Start Date End Date Taking? Authorizing Provider  ALPRAZolam (XANAX) 0.25 MG tablet Take 1 tablet (0.25 mg total) by mouth 2 (two) times daily as needed. for anxiety Patient taking differently: Take  0.25 mg by mouth 2 (two) times daily as needed for anxiety. 12/06/21   Elby Showers, MD  cetirizine (ZYRTEC) 10 MG tablet Chew 10 mg by mouth daily.    [provider]  dicyclomine (BENTYL) 10 MG capsule Take 1 capsule (10 mg total) by mouth 4 (four) times daily -  before meals and at bedtime. 04/14/22 05/14/22  Vanna Scotland, MD  ezetimibe (ZETIA) 10 MG tablet Take 1 tablet by mouth once daily Patient not taking: Reported on 07/07/2022 03/14/22   Elby Showers, MD  levothyroxine (SYNTHROID) 75 MCG tablet Take 1 tablet by mouth once daily 09/08/22   Elby Showers, MD  lisinopril (ZESTRIL) 5 MG  tablet Take 1 tablet by mouth once daily 08/18/22   Elby Showers, MD  Multiple Vitamin (MULTIVITAMIN) tablet Take 1 tablet by mouth daily.    [provider]  ondansetron (ZOFRAN) 4 MG tablet Take 4 mg by mouth every 8 (eight) hours as needed for nausea or vomiting. Patient not taking: Reported on 07/07/2022    [provider]  Polyethyl Glycol-Propyl Glycol (SYSTANE ULTRA OP) Place 1 drop into both eyes 2 (two) times daily.    [provider]  psyllium (METAMUCIL SMOOTH TEXTURE) 28 % packet Take 1 packet by mouth 2 (two) times daily.    [provider]  Hyoscyamine Sulfate SL (LEVSIN/SL) 0.125 MG SUBL One tab sublingual  one half hour before eating 05/23/18 04/10/20  Elby Showers, MD    Physical Exam: Vitals:   11/03/22 2249 11/03/22 2250 11/03/22 2256 11/03/22 2353  BP: (!) 141/37   (!) 124/57  Pulse:  71 69 73  Resp:   16 14  SpO2:  100% 98% 100%    Constitutional: NAD, calm, comfortable Vitals:   11/03/22 2249 11/03/22 2250 11/03/22 2256 11/03/22 2353  BP: (!) 141/37   (!) 124/57  Pulse:  71 69 73  Resp:   16 14  SpO2:  100% 98% 100%   Eyes: PERRL, lids and conjunctivae normal ENMT: Mucous membranes are moist. Posterior pharynx clear of any exudate or lesions.Normal dentition.  Neck: normal, supple, no masses, no thyromegaly Respiratory: clear to auscultation bilaterally, no wheezing, no crackles. Normal respiratory effort. No accessory muscle use.  Cardiovascular: Regular rate and rhythm, no murmurs / rubs / gallops. No extremity edema. 2+ pedal pulses.  Abdomen: no tenderness, no masses palpated. No hepatosplenomegaly. Bowel sounds positive.  Musculoskeletal: no clubbing / cyanosis. Right lower extremity shorter than left and slightly externally rotated. Normal muscle tone. Skin: no rashes, lesions, ulcers. No induration Neurologic: CN 2-12 grossly intact. Sensation intact Psychiatric: Normal judgment and insight. Alert and oriented x 3.  Normal mood.    Labs on Admission: I have personally reviewed following labs and imaging studies  CBC: Recent Labs  Lab 11/03/22 2250  WBC 11.4*  NEUTROABS 9.1*  HGB 11.1*  HCT 33.4*  MCV 95.4  PLT 865   Basic Metabolic Panel: Recent Labs  Lab 11/03/22 2250  NA 140  K 3.9  CL 102  CO2 25  GLUCOSE 144*  BUN 19  CREATININE 0.84  CALCIUM 9.1   GFR: CrCl cannot be calculated (Unknown ideal weight.). Liver Function Tests: No results for input(s): "AST", "ALT", "ALKPHOS", "BILITOT", "PROT", "ALBUMIN" in the last 168 hours. No results for input(s): "LIPASE", "AMYLASE" in the last 168 hours. No results for input(s): "AMMONIA" in the last 168 hours. Coagulation Profile: Recent Labs  Lab 11/03/22 2250  INR 1.0  Cardiac Enzymes: No results for input(s): "CKTOTAL", "CKMB", "CKMBINDEX", "TROPONINI" in the last 168 hours. BNP (last 3 results) No results for input(s): "PROBNP" in the last 8760 hours. HbA1C: No results for input(s): "HGBA1C" in the last 72 hours. CBG: No results for input(s): "GLUCAP" in the last 168 hours. Lipid Profile: No results for input(s): "CHOL", "HDL", "LDLCALC", "TRIG", "CHOLHDL", "LDLDIRECT" in the last 72 hours. Thyroid Function Tests: No results for input(s): "TSH", "T4TOTAL", "FREET4", "T3FREE", "THYROIDAB" in the last 72 hours. Anemia Panel: No results for input(s): "VITAMINB12", "FOLATE", "FERRITIN", "TIBC", "IRON", "RETICCTPCT" in the last 72 hours. Urine analysis:    Component Value Date/Time   COLORURINE YELLOW 04/08/2022 0323   APPEARANCEUR CLEAR 04/08/2022 0323   LABSPEC 1.018 04/08/2022 0323   PHURINE 5.0 04/08/2022 0323   GLUCOSEU NEGATIVE 04/08/2022 0323   HGBUR NEGATIVE 04/08/2022 0323   BILIRUBINUR neg 06/22/2022 1144   KETONESUR NEGATIVE 04/08/2022 0323   PROTEINUR Negative 06/22/2022 1144   PROTEINUR NEGATIVE 04/08/2022 0323   UROBILINOGEN 0.2 06/22/2022 1144   NITRITE neg 06/22/2022 1144   NITRITE NEGATIVE 04/08/2022  0323   LEUKOCYTESUR Negative 06/22/2022 1144   LEUKOCYTESUR TRACE (A) 04/08/2022 0323    Radiological Exams on Admission: DG Hip Unilat With Pelvis 2-3 Views Right  Addendum Date: 11/04/2022   ADDENDUM REPORT: 11/04/2022 00:46 ADDENDUM: Right ankle: Bony densities are noted inferior to the lateral malleolus suggesting avulsion fractures of indeterminate age. No dislocation. Electronically Signed   By: Thornell Sartorius M.D.   On: 11/04/2022 00:46   Result Date: 11/04/2022 CLINICAL DATA:  Fall, hip injury. EXAM: RIGHT ANKLE - COMPLETE 3+ VIEW; DG HIP (WITH OR WITHOUT PELVIS) 2-3V RIGHT COMPARISON:  None Available. FINDINGS: There is a fracture involving the femoral neck on the right with superior subluxation of the distal fracture fragment. No dislocation. The remaining bony structures are intact. The soft tissues are unremarkable. IMPRESSION: Mildly displaced femoral neck fracture on the right. Electronically Signed: By: Thornell Sartorius M.D. On: 11/03/2022 23:51   DG Ankle Complete Right  Addendum Date: 11/04/2022   ADDENDUM REPORT: 11/04/2022 00:46 ADDENDUM: Right ankle: Bony densities are noted inferior to the lateral malleolus suggesting avulsion fractures of indeterminate age. No dislocation. Electronically Signed   By: Thornell Sartorius M.D.   On: 11/04/2022 00:46   Result Date: 11/04/2022 CLINICAL DATA:  Fall, hip injury. EXAM: RIGHT ANKLE - COMPLETE 3+ VIEW; DG HIP (WITH OR WITHOUT PELVIS) 2-3V RIGHT COMPARISON:  None Available. FINDINGS: There is a fracture involving the femoral neck on the right with superior subluxation of the distal fracture fragment. No dislocation. The remaining bony structures are intact. The soft tissues are unremarkable. IMPRESSION: Mildly displaced femoral neck fracture on the right. Electronically Signed: By: Thornell Sartorius M.D. On: 11/03/2022 23:51   DG Chest Portable 1 View  Result Date: 11/03/2022 CLINICAL DATA:  Larey Seat, hip fracture, preoperative evaluation EXAM: PORTABLE  CHEST 1 VIEW COMPARISON:  08/15/2013 FINDINGS: Single frontal view of the chest demonstrates an unremarkable cardiac silhouette. No airspace disease, effusion, or pneumothorax. No acute bony abnormality. IMPRESSION: 1. No acute intrathoracic process. Electronically Signed   By: Sharlet Salina M.D.   On: 11/03/2022 23:45    EKG: Independently reviewed.  Assessment/Plan  Right hip fracture s/p mechanical fall  -admit to med tele  - npo midnight  - ortho Dr Victorino Dike will follow in am with plan to take to OR  -supportive care for pain  -place on hip protocol    Right ankle avulsion  fracture  - avulsion fractures of indeterminate age -supportive care  -await ortho recs  Essential Hypertension  -currently well controlled   Hypertension -resume home regimen as able once med rec completed    Hypothyroidism -continue replacement    GERD -ppi  IBS -controlled ,no current symptoms   Hyperlipidemia -continue statin   Patient with no cardiac complaints ,stable chronic medical conditions, able to complete all adls w/o assistance prior to fall .  Patient is Class 1  RCRI  pre-op  risk, 3.9% 30 day risk of death,MI or cardiac risk, acceptable/low risk   DVT prophylaxis: scd Code Status: full/ as discussed per patient wishes in event of cardiac arrest  Family Communication: none at bedside Disposition Plan:patient  expected to be admitted greater than 2 midnights  Consults called: ortho Dr Doran Durand Admission status: med Karen Chafe   Clance Boll MD [facility]   If 7PM-7AM, please contact night-coverage [email or web address] Password [facility]  11/04/2022, 12:57 AM

## 2022-11-04 NOTE — H&P (View-Only) (Signed)
ORTHOPAEDIC CONSULTATION  REQUESTING PHYSICIAN: Oswald Hillock, MD  PCP:  Elby Showers, MD  Chief Complaint: Fall,right hip pain  HPI: Dominique Livingston is a 89 y.o. female who complains of right hip pain following a fall on 11/04/2022.  She was standing and her foot caught on the carpet and fell directly on her right hip.  She is on the ground for about 1 hour as she could not reach the phone but was able to unlock the door and crawl her way to a phone.  She was brought in by EMS and admitted to the hospital after x-rays revealed a femoral neck fracture.  Lives at home alone, denies diabetes.  Daughter is in the room at time of exam.  She does have a past medical history of hypertension, hypothyroidism, GERD, diverticulosis.  Past Medical History:  Diagnosis Date   Allergy    Anxiety    Arthritis    hands , stiffness in knees & hips    Colon polyp, hyperplastic    Diverticulosis    Endometriosis    Fibrocystic breast disease    GERD (gastroesophageal reflux disease)    Hyperlipidemia    Hypertension    Hypothyroidism    IBS (irritable bowel syndrome)    Polymyalgia rheumatica (HCC)    Past Surgical History:  Procedure Laterality Date   ABDOMINAL HYSTERECTOMY     APPENDECTOMY     BREAST BIOPSY Right    CATARACT EXTRACTION, BILATERAL Bilateral    cataracts   CHOLECYSTECTOMY N/A 05/09/2016   Procedure: LAPAROSCOPIC CHOLECYSTECTOMY WITH INTRAOPERATIVE CHOLANGIOGRAM;  Surgeon: Jackolyn Confer, MD;  Location: [facility];  Service: General;  Laterality: N/A;   LEFT COLECTOMY     removed about 18 in due to endometriosos   Social History   Socioeconomic History   Marital status: Widowed    Spouse name: Not on file   Number of children: 2   Years of education: Not on file   Highest education level: Not on file  Occupational History   Occupation: retired  Tobacco Use   Smoking status: Never   Smokeless tobacco: Never  Vaping Use   Vaping Use: Never used   Substance and Sexual Activity   Alcohol use: No   Drug use: No   Sexual activity: Not on file  Other Topics Concern   Not on file  Social History Narrative   Not on file   Social Determinants of Health   Financial Resource Strain: Not on file  Food Insecurity: Not on file  Transportation Needs: Not on file  Physical Activity: Not on file  Stress: Not on file  Social Connections: Not on file   Family History  Problem Relation Age of Onset   Cancer Mother        type unknown   Hypertension Mother    Heart disease Father    COPD Father    Hypertension Daughter    Heart disease Son    Heart attack Son    Breast cancer Neg Hx    Allergies  Allergen Reactions   Amoxicillin-Pot Clavulanate Nausea Only   Erythromycin Nausea And Vomiting   Mushroom Extract Complex Other (See Comments)    Cantalope, adding enviromental allergens such as grass & trees Had allergy test   Macrobid [Nitrofurantoin Monohyd Macro] Rash   Zetia [Ezetimibe] Rash   Prior to Admission medications   Medication Sig Start Date End Date Taking? Authorizing Provider  ALPRAZolam Duanne Moron) 0.25 MG  tablet Take 0.25 mg by mouth 2 (two) times daily as needed for anxiety.   Yes [provider]  levothyroxine (SYNTHROID) 75 MCG tablet Take 1 tablet by mouth once daily Patient taking differently: Take 75 mcg by mouth daily before breakfast. 09/08/22  Yes Baxley, Cresenciano Lick, MD  lisinopril (ZESTRIL) 5 MG tablet Take 1 tablet by mouth once daily 08/18/22  Yes Baxley, Cresenciano Lick, MD  Multiple Vitamin (MULTIVITAMIN) tablet Take 1 tablet by mouth daily.   Yes [provider]  Polyethyl Glycol-Propyl Glycol (SYSTANE ULTRA OP) Place 1 drop into both eyes 2 (two) times daily.   Yes [provider]  psyllium (METAMUCIL SMOOTH TEXTURE) 28 % packet Take 1 packet by mouth 2 (two) times daily.   Yes [provider]  Hyoscyamine Sulfate SL (LEVSIN/SL) 0.125 MG SUBL One tab sublingual  one half hour  before eating 05/23/18 04/10/20  Elby Showers, MD   DG Hip Unilat With Pelvis 2-3 Views Right  Addendum Date: 11/04/2022   ADDENDUM REPORT: 11/04/2022 00:46 ADDENDUM: Right ankle: Bony densities are noted inferior to the lateral malleolus suggesting avulsion fractures of indeterminate age. No dislocation. Electronically Signed   By: Brett Fairy M.D.   On: 11/04/2022 00:46   Result Date: 11/04/2022 CLINICAL DATA:  Fall, hip injury. EXAM: RIGHT ANKLE - COMPLETE 3+ VIEW; DG HIP (WITH OR WITHOUT PELVIS) 2-3V RIGHT COMPARISON:  None Available. FINDINGS: There is a fracture involving the femoral neck on the right with superior subluxation of the distal fracture fragment. No dislocation. The remaining bony structures are intact. The soft tissues are unremarkable. IMPRESSION: Mildly displaced femoral neck fracture on the right. Electronically Signed: By: Brett Fairy M.D. On: 11/03/2022 23:51   DG Ankle Complete Right  Addendum Date: 11/04/2022   ADDENDUM REPORT: 11/04/2022 00:46 ADDENDUM: Right ankle: Bony densities are noted inferior to the lateral malleolus suggesting avulsion fractures of indeterminate age. No dislocation. Electronically Signed   By: Brett Fairy M.D.   On: 11/04/2022 00:46   Result Date: 11/04/2022 CLINICAL DATA:  Fall, hip injury. EXAM: RIGHT ANKLE - COMPLETE 3+ VIEW; DG HIP (WITH OR WITHOUT PELVIS) 2-3V RIGHT COMPARISON:  None Available. FINDINGS: There is a fracture involving the femoral neck on the right with superior subluxation of the distal fracture fragment. No dislocation. The remaining bony structures are intact. The soft tissues are unremarkable. IMPRESSION: Mildly displaced femoral neck fracture on the right. Electronically Signed: By: Brett Fairy M.D. On: 11/03/2022 23:51   DG Chest Portable 1 View  Result Date: 11/03/2022 CLINICAL DATA:  [address] [address], hip fracture, preoperative evaluation EXAM: PORTABLE CHEST 1 VIEW COMPARISON:  08/15/2013 FINDINGS: Single frontal view of the  chest demonstrates an unremarkable cardiac silhouette. No airspace disease, effusion, or pneumothorax. No acute bony abnormality. IMPRESSION: 1. No acute intrathoracic process. Electronically Signed   By: Randa Ngo M.D.   On: 11/03/2022 23:45    Positive ROS: All other systems have been reviewed and were otherwise negative with the exception of those mentioned in the HPI and as above.  Physical Exam: General: Alert, no acute distress Cardiovascular: No pedal edema Respiratory: No cyanosis, no use of accessory musculature GI: No organomegaly, abdomen is soft and non-tender Skin: No lesions in the area of chief complaint Neurologic: Sensation intact distally Psychiatric: Patient is competent for consent with normal mood and affect Lymphatic: No axillary or cervical lymphadenopathy  MUSCULOSKELETAL:  Right lower extremity:   Pain with logroll, foot in slight external rotation and shortening.  No tenderness to lateral hip.  No tenderness to the knee, minimal tenderness of the calf.  Ecchymosis along the lateral ankle with mild discomfort and tenderness, mild tenderness to the medial malleolus.  Full range of motion.  DP pulse 2+.  Able to wiggle all toes.  Neurovascular intact distally.  No tenderness to the bilateral upper extremities, no tenderness to the left lower extremity.  Assessment: Right femoral neck fracture Right ankle sprain   Plan: Plan for hemiarthroplasty on Sunday or Monday pending surgeon availability.  Discussed risk and benefits, plan with patient and her daughter.  Nonweightbearing to the right lower extremity , recommend ankle pumps, SCDs, pain control.  Will give 1 dose of Lovenox for DVT prophylaxis.,  Hold after that until post op.   Diet placed for today, will tentatively put NPO for midnight until we have confirmation on surgeon availability.   Right ankle sprain - ASO as needed when ambulating , supportive measures.     Faythe Casa,  PA    11/04/2022 1:20 PM  I have seen and examined Dominique Livingston and agree with the above assessment and exam. She has a displaced right femoral neck fracture from a ground level mechanical fall. She is otherwise a healthy independent community ambulator. Treatment options were discussed and we will proceed with a total hip arthroplasty. Discussed in detail with the patient who elects to proceed

## 2022-11-04 NOTE — Progress Notes (Signed)
PT Cancellation Note  Patient Details Name: Dominique Livingston MRN: [id number] DOB: [birth year]   Cancelled Treatment:    Reason Eval/Treat Not Completed: Patient not medically ready (Pt is still on bed rest orders.)   Ander Purpura 11/04/2022, 8:35 AM

## 2022-11-04 NOTE — ED Notes (Signed)
ED TO INPATIENT HANDOFF REPORT  ED Nurse Name and Phone #: Soundra Pilon, RN [phone]   S Name/Age/Gender Dominique Livingston 89 y.o. female Room/Bed: 016C/016C  Code Status   Code Status: Full Code  Home/SNF/Other Home Patient oriented to: self, place, time, and situation Is this baseline? Yes   Triage Complete: Triage complete  Chief Complaint Hip fracture (HCC) [S72.009A]  Triage Note Pt BIB [facility] from home after fall from standing when her foot got caught on the carpet and she fell on her right hip. Patient was on the ground for ~1hr because she couldn't reach a phone. Patient denies LOC, no head injury. EMS reports shortening and rotation to right leg. VSS, NAD. fentanyl and 4mg  zofran given en route. Patient reports pain at 5/10.   Allergies Allergies  Allergen Reactions   Amoxicillin-Pot Clavulanate Nausea Only   Erythromycin Nausea And Vomiting   Mushroom Extract Complex Other (See Comments)    Cantalope, adding enviromental allergens such as grass & trees   Macrobid [Nitrofurantoin Monohyd Macro] Rash    Level of Care/Admitting Diagnosis ED Disposition     ED Disposition  Admit   Condition  --   Comment  Hospital Area: [facility] [[id number]]  Level of Care: Telemetry Medical [104]  May admit patient to [facility] or [facility] if equivalent level of care is available:: Yes  Covid Evaluation: Asymptomatic - no recent exposure (last 10 days) testing not required  Diagnosis: Hip fracture [facility]  Admitting Physician: ) [[id number]] Lurline Del  Attending Physician: [[id number]] Lurline Del  Certification:: I certify this patient will need inpatient services for at least 2 midnights  Estimated Length of Stay: 3          B Medical/Surgery History Past Medical History:  Diagnosis Date   Allergy    Anxiety    Arthritis    hands , stiffness in knees & hips    Colon polyp, hyperplastic    Diverticulosis     Endometriosis    Fibrocystic breast disease    GERD (gastroesophageal reflux disease)    Hyperlipidemia    Hypertension    Hypothyroidism    IBS (irritable bowel syndrome)    Polymyalgia rheumatica (HCC)    Past Surgical History:  Procedure Laterality Date   ABDOMINAL HYSTERECTOMY     APPENDECTOMY     BREAST BIOPSY Right    CATARACT EXTRACTION, BILATERAL Bilateral    cataracts   CHOLECYSTECTOMY N/A 05/09/2016   Procedure: LAPAROSCOPIC CHOLECYSTECTOMY WITH INTRAOPERATIVE CHOLANGIOGRAM;  Surgeon: 07/09/2016, MD;  Location: [facility] OR;  Service: General;  Laterality: N/A;   LEFT COLECTOMY     removed about 18 in due to endometriosos     A IV Location/Drains/Wounds Patient Lines/Drains/Airways Status     Active Line/Drains/Airways     Name Placement date Placement time Site Days   Peripheral IV 11/03/22 18 G Right Antecubital 11/03/22  2243  Antecubital  1   Incision (Closed) 05/09/16 Abdomen Other (Comment) 05/09/16  0829  -- 2370   Incision - 4 Ports Abdomen 1: Umbilicus 2: Mid;Upper 3: Right;Medial 4: Right;Lateral 05/09/16  0800  -- 2370            Intake/Output Last 24 hours  Intake/Output Summary (Last 24 hours) at 11/04/2022 0202 Last data filed at 11/04/2022 0035 Gross per 24 hour  Intake 1000 ml  Output --  Net 1000 ml    Labs/Imaging Results for orders placed or performed during the  hospital encounter of 11/03/22 (from the past 48 hour(s))  Basic metabolic panel     Status: Abnormal   Collection Time: 11/03/22 10:50 PM  Result Value Ref Range   Sodium 140 135 - 145 mmol/L   Potassium 3.9 3.5 - 5.1 mmol/L   Chloride 102 98 - 111 mmol/L   CO2 25 22 - 32 mmol/L   Glucose, Bld 144 (H) 70 - 99 mg/dL    Comment: Glucose reference range applies only to samples taken after fasting for at least 8 hours.   BUN 19 8 - 23 mg/dL   Creatinine, Ser 0.84 0.44 - 1.00 mg/dL   Calcium 9.1 8.9 - 10.3 mg/dL   GFR, Estimated >60 >60 mL/min    Comment: (NOTE) Calculated  using the CKD-EPI Creatinine Equation (2021)    Anion gap 13 5 - 15    Comment: Performed at [facility] [address], [place], [place] [address]  CBC with Differential     Status: Abnormal   Collection Time: 11/03/22 10:50 PM  Result Value Ref Range   WBC 11.4 (H) 4.0 - 10.5 K/uL   RBC 3.50 (L) 3.87 - 5.11 MIL/uL   Hemoglobin 11.1 (L) 12.0 - 15.0 g/dL   HCT 33.4 (L) 36.0 - 46.0 %   MCV 95.4 80.0 - 100.0 fL   MCH 31.7 26.0 - 34.0 pg   MCHC 33.2 30.0 - 36.0 g/dL   RDW 12.5 11.5 - 15.5 %   Platelets 159 150 - 400 K/uL   nRBC 0.0 0.0 - 0.2 %   Neutrophils Relative % 79 %   Neutro Abs 9.1 (H) 1.7 - 7.7 K/uL   Lymphocytes Relative 14 %   Lymphs Abs 1.6 0.7 - 4.0 K/uL   Monocytes Relative 5 %   Monocytes Absolute 0.5 0.1 - 1.0 K/uL   Eosinophils Relative 0 %   Eosinophils Absolute 0.0 0.0 - 0.5 K/uL   Basophils Relative 1 %   Basophils Absolute 0.1 0.0 - 0.1 K/uL   Immature Granulocytes 1 %   Abs Immature Granulocytes 0.06 0.00 - 0.07 K/uL    Comment: Performed at [facility], [place] [address], [place], [place] [address]  Protime-INR     Status: None   Collection Time: 11/03/22 10:50 PM  Result Value Ref Range   Prothrombin Time 13.5 11.4 - 15.2 seconds   INR 1.0 0.8 - 1.2    Comment: (NOTE) INR goal varies based on device and disease states. Performed at [facility], [place] [address], [place], [place] [address]   Type and screen Corn     Status: None (Preliminary result)   Collection Time: 11/03/22 11:01 PM  Result Value Ref Range   ABO/RH(D) A POS    Antibody Screen POS    Sample Expiration 11/06/2022,2359    Antibody Identification ANTI E    PT AG Type NEGATIVE FOR E ANTIGEN    Unit Number [id number]    Blood Component Type RED CELLS,LR    Unit division 00    Status of Unit ALLOCATED    Donor AG Type NEGATIVE FOR E ANTIGEN    Transfusion Status OK TO TRANSFUSE    Crossmatch Result COMPATIBLE    Unit Number  [id number]    Blood Component Type RED CELLS,LR    Unit division 00    Status of Unit ALLOCATED    Donor AG Type NEGATIVE FOR E ANTIGEN    Transfusion Status OK  TO TRANSFUSE    Crossmatch Result COMPATIBLE   ABO/Rh     Status: None   Collection Time: 11/03/22 11:25 PM  Result Value Ref Range   ABO/RH(D)      A POS Performed at [facility], [place] [address], [place], [place] [address]    DG Hip Unilat With Pelvis 2-3 Views Right  Addendum Date: 11/04/2022   ADDENDUM REPORT: 11/04/2022 00:46 ADDENDUM: Right ankle: Bony densities are noted inferior to the lateral malleolus suggesting avulsion fractures of indeterminate age. No dislocation. Electronically Signed   By: Brett Fairy M.D.   On: 11/04/2022 00:46   Result Date: 11/04/2022 CLINICAL DATA:  Fall, hip injury. EXAM: RIGHT ANKLE - COMPLETE 3+ VIEW; DG HIP (WITH OR WITHOUT PELVIS) 2-3V RIGHT COMPARISON:  None Available. FINDINGS: There is a fracture involving the femoral neck on the right with superior subluxation of the distal fracture fragment. No dislocation. The remaining bony structures are intact. The soft tissues are unremarkable. IMPRESSION: Mildly displaced femoral neck fracture on the right. Electronically Signed: By: Brett Fairy M.D. On: 11/03/2022 23:51   DG Ankle Complete Right  Addendum Date: 11/04/2022   ADDENDUM REPORT: 11/04/2022 00:46 ADDENDUM: Right ankle: Bony densities are noted inferior to the lateral malleolus suggesting avulsion fractures of indeterminate age. No dislocation. Electronically Signed   By: Brett Fairy M.D.   On: 11/04/2022 00:46   Result Date: 11/04/2022 CLINICAL DATA:  Fall, hip injury. EXAM: RIGHT ANKLE - COMPLETE 3+ VIEW; DG HIP (WITH OR WITHOUT PELVIS) 2-3V RIGHT COMPARISON:  None Available. FINDINGS: There is a fracture involving the femoral neck on the right with superior subluxation of the distal fracture fragment. No dislocation. The remaining bony structures are intact. The soft  tissues are unremarkable. IMPRESSION: Mildly displaced femoral neck fracture on the right. Electronically Signed: By: Brett Fairy M.D. On: 11/03/2022 23:51   DG Chest Portable 1 View  Result Date: 11/03/2022 CLINICAL DATA:  [address] [address], hip fracture, preoperative evaluation EXAM: PORTABLE CHEST 1 VIEW COMPARISON:  08/15/2013 FINDINGS: Single frontal view of the chest demonstrates an unremarkable cardiac silhouette. No airspace disease, effusion, or pneumothorax. No acute bony abnormality. IMPRESSION: 1. No acute intrathoracic process. Electronically Signed   By: Randa Ngo M.D.   On: 11/03/2022 23:45    Pending Labs Unresulted Labs (From admission, onward)     Start     Ordered   11/04/22 0500  CBC  Tomorrow morning,   R        11/04/22 0056   11/04/22 8768  Basic metabolic panel  Tomorrow morning,   R        11/04/22 0056   11/04/22 0053  Urinalysis, Routine w reflex microscopic -Urine, Clean Catch  Once,   R       Question:  Specimen Source  Answer:  Urine, Clean Catch   11/04/22 0056   11/04/22 0053  VITAMIN D 25 Hydroxy (Vit-D Deficiency, Fractures)  Once,   R        11/04/22 0056            Vitals/Pain Today's Vitals   11/03/22 2353 11/04/22 0035 11/04/22 0159 11/04/22 0159  BP: (!) 124/57   (!) 147/74  Pulse: 73   76  Resp: 14   16  Temp:    98.5 F (36.9 C)  TempSrc:    Oral  SpO2: 100%   100%  PainSc: 9  5  5       Isolation Precautions No active  isolations  Medications Medications  fentaNYL (SUBLIMAZE) injection 50 mcg (50 mcg Intravenous Given 11/03/22 2347)  morphine (PF) 4 MG/ML injection 4 mg (has no administration in time range)  HYDROcodone-acetaminophen (NORCO/VICODIN) 5-325 MG per tablet 1-2 tablet (has no administration in time range)  morphine (PF) 2 MG/ML injection 0.5 mg (has no administration in time range)  0.9 %  sodium chloride infusion (has no administration in time range)  ondansetron (ZOFRAN) injection 4 mg (4 mg Intravenous Given 11/03/22 2347)   sodium chloride 0.9 % bolus 1,000 mL (0 mLs Intravenous Stopped 11/04/22 0035)    Mobility walks     Focused Assessments See Musculoskeletal Assessment   R Recommendations: See Admitting Provider Note  Report given to:   Additional Notes:

## 2022-11-04 NOTE — Progress Notes (Signed)
Initial Nutrition Assessment  DOCUMENTATION CODES:   Not applicable  INTERVENTION:   - Please obtain height and weight for this admission  - Once diet advanced, Ensure Enlive po BID, each supplement provides 350 kcal and 20 grams of protein  - Continue MVI with minerals daily  NUTRITION DIAGNOSIS:   Inadequate oral intake related to inability to eat as evidenced by NPO status.  GOAL:   Patient will meet greater than or equal to 90% of their needs  MONITOR:   PO intake, Supplement acceptance, Labs, Weight trends  REASON FOR ASSESSMENT:   Consult Hip fracture protocol  ASSESSMENT:   89 year old female who presented to the ED on 2/02 after a fall. PMH of HTN, hypothyroidism, GERD, diverticulosis, endometriosis s/p remote R hemicolectomy, IBS, HLD, colon polyps. Pt admitted with with R hip fx.  RD working remotely. Unable to reach pt via phone call to room.  Pt currently NPO pending Orthopedics consult and plan to take pt to the OR. RD to order oral nutrition supplement for pt once diet advanced.  Pt does not have an admission height or weight documented. Please obtain admission height and weight. Reviewed weight history in chart. Noted weight had been trending down from 58.3 kg on 04/21/22 to 55.8 kg on 07/07/22. Pt at risk for malnutrition if weight loss has continued.  Medications reviewed and include: MVI with minerals, psyllium  Labs reviewed.  NUTRITION - FOCUSED PHYSICAL EXAM:  Unable to complete at this time. RD working remotely.  Diet Order:   Diet Order             Diet NPO time specified  Diet effective now                   EDUCATION NEEDS:   Not appropriate for education at this time  Skin:  Skin Assessment: Reviewed RN Assessment  Last BM:  11/03/22  Height:   Ht Readings from Last 1 Encounters:  07/07/22 5\' 1"  (1.549 m)    Weight:   Wt Readings from Last 1 Encounters:  07/07/22 55.8 kg    BMI:  23.26 kg/m2 (calculated using  height and weight from 07/07/22)  Estimated Nutritional Needs:   Kcal:  1650-1850  Protein:  75-90 grams  Fluid:  1.6-1.8 L    Gustavus Bryant, MS, RD, LDN Inpatient Clinical Dietitian Please see AMiON for contact information.

## 2022-11-05 ENCOUNTER — Inpatient Hospital Stay (HOSPITAL_COMMUNITY): Payer: Medicare Other | Admitting: Anesthesiology

## 2022-11-05 ENCOUNTER — Inpatient Hospital Stay (HOSPITAL_COMMUNITY): Payer: Medicare Other

## 2022-11-05 ENCOUNTER — Other Ambulatory Visit: Payer: Self-pay

## 2022-11-05 ENCOUNTER — Encounter (HOSPITAL_COMMUNITY)
Admission: EM | Disposition: A | Discharge status: Rehabilitation Facility | Payer: Self-pay | Source: Home / Self Care | Attending: Family Medicine

## 2022-11-05 ENCOUNTER — Encounter (HOSPITAL_COMMUNITY): Payer: Self-pay | Admitting: Internal Medicine

## 2022-11-05 DIAGNOSIS — S72001A Fracture of unspecified part of neck of right femur, initial encounter for closed fracture: Secondary | ICD-10-CM | POA: Diagnosis not present

## 2022-11-05 DIAGNOSIS — I10 Essential (primary) hypertension: Secondary | ICD-10-CM

## 2022-11-05 DIAGNOSIS — S82891A Other fracture of right lower leg, initial encounter for closed fracture: Secondary | ICD-10-CM | POA: Diagnosis not present

## 2022-11-05 DIAGNOSIS — E039 Hypothyroidism, unspecified: Secondary | ICD-10-CM | POA: Diagnosis not present

## 2022-11-05 DIAGNOSIS — F419 Anxiety disorder, unspecified: Secondary | ICD-10-CM

## 2022-11-05 HISTORY — PX: TOTAL HIP ARTHROPLASTY: SHX124

## 2022-11-05 LAB — URINALYSIS, ROUTINE W REFLEX MICROSCOPIC
Bilirubin Urine: NEGATIVE
Glucose, UA: NEGATIVE mg/dL
Hgb urine dipstick: NEGATIVE
Ketones, ur: NEGATIVE mg/dL
Leukocytes,Ua: NEGATIVE
Nitrite: NEGATIVE
Protein, ur: NEGATIVE mg/dL
Specific Gravity, Urine: 1.009 (ref 1.005–1.030)
pH: 6 (ref 5.0–8.0)

## 2022-11-05 LAB — SURGICAL PCR SCREEN
MRSA, PCR: NEGATIVE
Staphylococcus aureus: NEGATIVE

## 2022-11-05 SURGERY — ARTHROPLASTY, HIP, TOTAL, ANTERIOR APPROACH
Anesthesia: General | Site: Hip | Laterality: Right

## 2022-11-05 MED ORDER — TRANEXAMIC ACID-NACL 1000-0.7 MG/100ML-% IV SOLN
INTRAVENOUS | Status: DC | PRN
  Administered 2022-11-05: 1000 mg via INTRAVENOUS

## 2022-11-05 MED ORDER — AMISULPRIDE (ANTIEMETIC) 5 MG/2ML IV SOLN: 10.0000 mg | Freq: Once | INTRAVENOUS | Status: DC | PRN

## 2022-11-05 MED ORDER — DEXAMETHASONE SODIUM PHOSPHATE 10 MG/ML IJ SOLN
INTRAMUSCULAR | Status: DC | PRN
  Administered 2022-11-05: 4 mg via INTRAVENOUS

## 2022-11-05 MED ORDER — 0.9 % SODIUM CHLORIDE (POUR BTL) OPTIME
TOPICAL | Status: DC | PRN
  Administered 2022-11-05: 1000 mL

## 2022-11-05 MED ORDER — ONDANSETRON HCL 4 MG PO TABS: 4.0000 mg | ORAL_TABLET | Freq: Four times a day (QID) | ORAL | Status: DC | PRN

## 2022-11-05 MED ORDER — SUGAMMADEX SODIUM 200 MG/2ML IV SOLN
INTRAVENOUS | Status: DC | PRN
  Administered 2022-11-05: 200 mg via INTRAVENOUS

## 2022-11-05 MED ORDER — DEXAMETHASONE SODIUM PHOSPHATE 10 MG/ML IJ SOLN
10.0000 mg | Freq: Once | INTRAMUSCULAR | Status: AC
  Administered 2022-11-06: 10 mg via INTRAVENOUS
  Filled 2022-11-05: qty 1

## 2022-11-05 MED ORDER — ACETAMINOPHEN 325 MG PO TABS
325.0000 mg | ORAL_TABLET | Freq: Four times a day (QID) | ORAL | Status: DC | PRN
  Administered 2022-11-09: 650 mg via ORAL
  Filled 2022-11-05: qty 2

## 2022-11-05 MED ORDER — METOCLOPRAMIDE HCL 5 MG/ML IJ SOLN: 5.0000 mg | Freq: Three times a day (TID) | INTRAMUSCULAR | Status: DC | PRN

## 2022-11-05 MED ORDER — MORPHINE SULFATE (PF) 2 MG/ML IV SOLN: 1.0000 mg | INTRAVENOUS | Status: DC | PRN

## 2022-11-05 MED ORDER — BUPIVACAINE HCL 0.25 % IJ SOLN
INTRAMUSCULAR | Status: AC
  Filled 2022-11-05: qty 1

## 2022-11-05 MED ORDER — MORPHINE SULFATE (PF) 2 MG/ML IV SOLN: 0.5000 mg | INTRAVENOUS | Status: DC | PRN

## 2022-11-05 MED ORDER — ACETAMINOPHEN 500 MG PO TABS
500.0000 mg | ORAL_TABLET | Freq: Four times a day (QID) | ORAL | Status: AC
  Administered 2022-11-05 – 2022-11-06 (×3): 500 mg via ORAL
  Filled 2022-11-05 (×3): qty 1

## 2022-11-05 MED ORDER — LIDOCAINE HCL (PF) 2 % IJ SOLN
INTRAMUSCULAR | Status: AC
  Filled 2022-11-05: qty 5

## 2022-11-05 MED ORDER — TRANEXAMIC ACID-NACL 1000-0.7 MG/100ML-% IV SOLN
INTRAVENOUS | Status: AC
  Filled 2022-11-05: qty 100

## 2022-11-05 MED ORDER — FLEET ENEMA 7-19 GM/118ML RE ENEM: 1.0000 | ENEMA | Freq: Once | RECTAL | Status: DC | PRN

## 2022-11-05 MED ORDER — BUPIVACAINE-EPINEPHRINE (PF) 0.25% -1:200000 IJ SOLN
INTRAMUSCULAR | Status: DC | PRN
  Administered 2022-11-05: 25 mL

## 2022-11-05 MED ORDER — POLYETHYLENE GLYCOL 3350 17 G PO PACK: 17.0000 g | PACK | Freq: Every day | ORAL | Status: DC | PRN

## 2022-11-05 MED ORDER — WATER FOR IRRIGATION, STERILE IR SOLN
Status: DC | PRN
  Administered 2022-11-05: 1000 mL

## 2022-11-05 MED ORDER — FENTANYL CITRATE (PF) 100 MCG/2ML IJ SOLN
INTRAMUSCULAR | Status: AC
  Filled 2022-11-05: qty 2

## 2022-11-05 MED ORDER — EPHEDRINE SULFATE-NACL 50-0.9 MG/10ML-% IV SOSY
PREFILLED_SYRINGE | INTRAVENOUS | Status: DC | PRN
  Administered 2022-11-05: 10 mg via INTRAVENOUS

## 2022-11-05 MED ORDER — TRAMADOL HCL 50 MG PO TABS: 50.0000 mg | ORAL_TABLET | Freq: Four times a day (QID) | ORAL | Status: DC | PRN

## 2022-11-05 MED ORDER — CEFAZOLIN SODIUM-DEXTROSE 2-4 GM/100ML-% IV SOLN
INTRAVENOUS | Status: AC
  Filled 2022-11-05: qty 100

## 2022-11-05 MED ORDER — CEFAZOLIN SODIUM-DEXTROSE 2-4 GM/100ML-% IV SOLN
2.0000 g | Freq: Four times a day (QID) | INTRAVENOUS | Status: AC
  Administered 2022-11-05 (×2): 2 g via INTRAVENOUS
  Filled 2022-11-05 (×2): qty 100

## 2022-11-05 MED ORDER — CEFAZOLIN SODIUM-DEXTROSE 2-4 GM/100ML-% IV SOLN
2.0000 g | Freq: Once | INTRAVENOUS | Status: AC
  Administered 2022-11-05: 2 g via INTRAVENOUS

## 2022-11-05 MED ORDER — MENTHOL 3 MG MT LOZG: 1.0000 | LOZENGE | OROMUCOSAL | Status: DC | PRN

## 2022-11-05 MED ORDER — PHENOL 1.4 % MT LIQD: 1.0000 | OROMUCOSAL | Status: DC | PRN

## 2022-11-05 MED ORDER — DOCUSATE SODIUM 100 MG PO CAPS: 100.0000 mg | ORAL_CAPSULE | Freq: Two times a day (BID) | ORAL | Status: DC

## 2022-11-05 MED ORDER — ASPIRIN 325 MG PO TBEC: 325.0000 mg | DELAYED_RELEASE_TABLET | Freq: Two times a day (BID) | ORAL | Status: DC

## 2022-11-05 MED ORDER — HYDROCODONE-ACETAMINOPHEN 5-325 MG PO TABS
1.0000 | ORAL_TABLET | ORAL | Status: DC | PRN
  Administered 2022-11-05: 1 via ORAL

## 2022-11-05 MED ORDER — FENTANYL CITRATE PF 50 MCG/ML IJ SOSY: 25.0000 ug | PREFILLED_SYRINGE | INTRAMUSCULAR | Status: DC | PRN

## 2022-11-05 MED ORDER — METOCLOPRAMIDE HCL 5 MG PO TABS: 5.0000 mg | ORAL_TABLET | Freq: Three times a day (TID) | ORAL | Status: DC | PRN

## 2022-11-05 MED ORDER — DOCUSATE SODIUM 100 MG PO CAPS
100.0000 mg | ORAL_CAPSULE | Freq: Two times a day (BID) | ORAL | Status: DC
  Administered 2022-11-05 – 2022-11-09 (×9): 100 mg via ORAL
  Filled 2022-11-05 (×9): qty 1

## 2022-11-05 MED ORDER — FENTANYL CITRATE (PF) 100 MCG/2ML IJ SOLN
INTRAMUSCULAR | Status: DC | PRN
  Administered 2022-11-05: 25 ug via INTRAVENOUS
  Administered 2022-11-05: 50 ug via INTRAVENOUS

## 2022-11-05 MED ORDER — EPHEDRINE 5 MG/ML INJ
INTRAVENOUS | Status: AC
  Filled 2022-11-05: qty 5

## 2022-11-05 MED ORDER — POLYETHYLENE GLYCOL 3350 17 G PO PACK
17.0000 g | PACK | Freq: Every day | ORAL | Status: DC | PRN
  Administered 2022-11-08: 17 g via ORAL
  Filled 2022-11-05: qty 1

## 2022-11-05 MED ORDER — ONDANSETRON HCL 4 MG/2ML IJ SOLN: 4.0000 mg | Freq: Four times a day (QID) | INTRAMUSCULAR | Status: DC | PRN

## 2022-11-05 MED ORDER — ONDANSETRON HCL 4 MG/2ML IJ SOLN
INTRAMUSCULAR | Status: DC | PRN
  Administered 2022-11-05: 4 mg via INTRAVENOUS

## 2022-11-05 MED ORDER — LISINOPRIL 5 MG PO TABS
5.0000 mg | ORAL_TABLET | Freq: Every day | ORAL | Status: DC
  Administered 2022-11-07: 5 mg via ORAL
  Filled 2022-11-05 (×3): qty 1

## 2022-11-05 MED ORDER — ACETAMINOPHEN 10 MG/ML IV SOLN: 1000.0000 mg | Freq: Once | INTRAVENOUS | Status: DC | PRN

## 2022-11-05 MED ORDER — BISACODYL 10 MG RE SUPP: 10.0000 mg | Freq: Every day | RECTAL | Status: DC | PRN

## 2022-11-05 MED ORDER — METHOCARBAMOL 500 MG IVPB - SIMPLE MED
500.0000 mg | Freq: Four times a day (QID) | INTRAVENOUS | Status: DC | PRN
  Administered 2022-11-05: 500 mg via INTRAVENOUS
  Filled 2022-11-05: qty 500

## 2022-11-05 MED ORDER — ACETAMINOPHEN 10 MG/ML IV SOLN
INTRAVENOUS | Status: AC
  Filled 2022-11-05: qty 100

## 2022-11-05 MED ORDER — CEFAZOLIN SODIUM-DEXTROSE 2-4 GM/100ML-% IV SOLN: 2.0000 g | Freq: Four times a day (QID) | INTRAVENOUS | Status: DC

## 2022-11-05 MED ORDER — ONDANSETRON HCL 4 MG/2ML IJ SOLN
INTRAMUSCULAR | Status: AC
  Filled 2022-11-05: qty 2

## 2022-11-05 MED ORDER — OXYCODONE HCL 5 MG/5ML PO SOLN: 5.0000 mg | Freq: Once | ORAL | Status: DC | PRN

## 2022-11-05 MED ORDER — LACTATED RINGERS IV SOLN: INTRAVENOUS | Status: DC | PRN

## 2022-11-05 MED ORDER — SODIUM CHLORIDE 0.9 % IV SOLN: INTRAVENOUS | Status: DC

## 2022-11-05 MED ORDER — HYDROCODONE-ACETAMINOPHEN 5-325 MG PO TABS
1.0000 | ORAL_TABLET | ORAL | Status: DC | PRN
  Filled 2022-11-05: qty 1

## 2022-11-05 MED ORDER — METHOCARBAMOL 500 MG PO TABS: 500.0000 mg | ORAL_TABLET | Freq: Four times a day (QID) | ORAL | Status: DC | PRN

## 2022-11-05 MED ORDER — TRAMADOL HCL 50 MG PO TABS
50.0000 mg | ORAL_TABLET | Freq: Four times a day (QID) | ORAL | Status: DC | PRN
  Administered 2022-11-06 – 2022-11-07 (×4): 100 mg via ORAL
  Filled 2022-11-05 (×5): qty 2

## 2022-11-05 MED ORDER — ADULT MULTIVITAMIN W/MINERALS CH
1.0000 | ORAL_TABLET | Freq: Every day | ORAL | Status: DC
  Administered 2022-11-05 – 2022-11-09 (×5): 1 via ORAL
  Filled 2022-11-05 (×5): qty 1

## 2022-11-05 MED ORDER — DEXAMETHASONE SODIUM PHOSPHATE 10 MG/ML IJ SOLN
INTRAMUSCULAR | Status: AC
  Filled 2022-11-05: qty 1

## 2022-11-05 MED ORDER — LIDOCAINE HCL (CARDIAC) PF 100 MG/5ML IV SOSY
PREFILLED_SYRINGE | INTRAVENOUS | Status: DC | PRN
  Administered 2022-11-05: 60 mg via INTRAVENOUS

## 2022-11-05 MED ORDER — PROPOFOL 10 MG/ML IV BOLUS
INTRAVENOUS | Status: AC
  Filled 2022-11-05: qty 20

## 2022-11-05 MED ORDER — PROPOFOL 10 MG/ML IV BOLUS
INTRAVENOUS | Status: DC | PRN
  Administered 2022-11-05: 90 mg via INTRAVENOUS

## 2022-11-05 MED ORDER — ASPIRIN 325 MG PO TBEC
325.0000 mg | DELAYED_RELEASE_TABLET | Freq: Two times a day (BID) | ORAL | Status: DC
  Administered 2022-11-06 – 2022-11-09 (×7): 325 mg via ORAL
  Filled 2022-11-05 (×7): qty 1

## 2022-11-05 MED ORDER — METHOCARBAMOL 500 MG PO TABS
500.0000 mg | ORAL_TABLET | Freq: Four times a day (QID) | ORAL | Status: DC | PRN
  Administered 2022-11-05 – 2022-11-07 (×2): 500 mg via ORAL
  Filled 2022-11-05 (×2): qty 1

## 2022-11-05 MED ORDER — ROCURONIUM BROMIDE 10 MG/ML (PF) SYRINGE
PREFILLED_SYRINGE | INTRAVENOUS | Status: DC | PRN
  Administered 2022-11-05: 40 mg via INTRAVENOUS

## 2022-11-05 MED ORDER — METHOCARBAMOL 1000 MG/10ML IJ SOLN: 500.0000 mg | Freq: Four times a day (QID) | INTRAVENOUS | Status: DC | PRN

## 2022-11-05 MED ORDER — DIPHENHYDRAMINE HCL 12.5 MG/5ML PO ELIX: 12.5000 mg | ORAL_SOLUTION | ORAL | Status: DC | PRN

## 2022-11-05 MED ORDER — ROCURONIUM BROMIDE 10 MG/ML (PF) SYRINGE
PREFILLED_SYRINGE | INTRAVENOUS | Status: AC
  Filled 2022-11-05: qty 10

## 2022-11-05 MED ORDER — ACETAMINOPHEN 10 MG/ML IV SOLN
INTRAVENOUS | Status: DC | PRN
  Administered 2022-11-05: 1000 mg via INTRAVENOUS

## 2022-11-05 MED ORDER — OXYCODONE HCL 5 MG PO TABS: 5.0000 mg | ORAL_TABLET | Freq: Once | ORAL | Status: DC | PRN

## 2022-11-05 MED ORDER — METHOCARBAMOL 500 MG IVPB - SIMPLE MED
INTRAVENOUS | Status: AC
  Administered 2022-11-05: 500 mg
  Filled 2022-11-05: qty 55

## 2022-11-05 SURGICAL SUPPLY — 45 items
BAG COUNTER SPONGE SURGICOUNT (BAG) IMPLANT
BAG DECANTER FOR FLEXI CONT (MISCELLANEOUS) IMPLANT
BAG SPEC THK2 15X12 ZIP CLS (MISCELLANEOUS)
BAG SPNG CNTER NS LX DISP (BAG)
BAG ZIPLOCK 12X15 (MISCELLANEOUS) IMPLANT
BLADE SAG 18X100X1.27 (BLADE) ×2 IMPLANT
CLSR STERI-STRIP ANTIMIC 1/2X4 (GAUZE/BANDAGES/DRESSINGS) IMPLANT
COVER PERINEAL POST (MISCELLANEOUS) ×2 IMPLANT
COVER SURGICAL LIGHT HANDLE (MISCELLANEOUS) ×2 IMPLANT
CUP ACET PINNACLE SECTR 50MM (Hips) IMPLANT
DRAPE FOOT SWITCH (DRAPES) ×2 IMPLANT
DRAPE STERI IOBAN 125X83 (DRAPES) ×2 IMPLANT
DRAPE U-SHAPE 47X51 STRL (DRAPES) ×4 IMPLANT
DRSG AQUACEL AG ADV 3.5X10 (GAUZE/BANDAGES/DRESSINGS) ×2 IMPLANT
DURAPREP 26ML APPLICATOR (WOUND CARE) ×2 IMPLANT
ELECT REM PT RETURN 15FT ADLT (MISCELLANEOUS) ×2 IMPLANT
GLOVE BIO SURGEON STRL SZ 6.5 (GLOVE) IMPLANT
GLOVE BIO SURGEON STRL SZ7.5 (GLOVE) IMPLANT
GLOVE BIO SURGEON STRL SZ8 (GLOVE) ×2 IMPLANT
GLOVE BIOGEL PI IND STRL 6.5 (GLOVE) IMPLANT
GLOVE BIOGEL PI IND STRL 7.0 (GLOVE) IMPLANT
GLOVE BIOGEL PI IND STRL 8 (GLOVE) ×2 IMPLANT
GOWN STRL REUS W/ TWL LRG LVL3 (GOWN DISPOSABLE) ×2 IMPLANT
GOWN STRL REUS W/ TWL XL LVL3 (GOWN DISPOSABLE) IMPLANT
GOWN STRL REUS W/TWL LRG LVL3 (GOWN DISPOSABLE) ×1
GOWN STRL REUS W/TWL XL LVL3 (GOWN DISPOSABLE)
HEAD FEM STD 32X+5 STRL (Hips) IMPLANT
HOLDER FOLEY CATH W/STRAP (MISCELLANEOUS) ×2 IMPLANT
KIT TURNOVER KIT A (KITS) IMPLANT
LINER MARATHON 32 50 (Hips) IMPLANT
MANIFOLD NEPTUNE II (INSTRUMENTS) ×2 IMPLANT
PACK ANTERIOR HIP CUSTOM (KITS) ×2 IMPLANT
PENCIL SMOKE EVACUATOR COATED (MISCELLANEOUS) ×2 IMPLANT
PINNACLE SECTOR CUP 50MM (Hips) ×1 IMPLANT
SPIKE FLUID TRANSFER (MISCELLANEOUS) ×2 IMPLANT
STEM FEMORAL SZ 6MM STD ACTIS (Stem) IMPLANT
STRIP CLOSURE SKIN 1/2X4 (GAUZE/BANDAGES/DRESSINGS) ×2 IMPLANT
SUT ETHIBOND NAB CT1 #1 30IN (SUTURE) ×2 IMPLANT
SUT MNCRL AB 4-0 PS2 18 (SUTURE) ×2 IMPLANT
SUT STRATAFIX 0 PDS 27 VIOLET (SUTURE) ×1
SUT VIC AB 2-0 CT1 27 (SUTURE) ×2
SUT VIC AB 2-0 CT1 TAPERPNT 27 (SUTURE) ×4 IMPLANT
SUTURE STRATFX 0 PDS 27 VIOLET (SUTURE) ×2 IMPLANT
TRAY FOLEY MTR SLVR 16FR STAT (SET/KITS/TRAYS/PACK) ×2 IMPLANT
TUBE SUCTION HIGH CAP CLEAR NV (SUCTIONS) ×2 IMPLANT

## 2022-11-05 NOTE — Anesthesia Procedure Notes (Signed)
Procedure Name: Intubation Date/Time: 11/05/2022 8:13 AM  Performed by: Jenne Campus, CRNAPre-anesthesia Checklist: Patient identified, Emergency Drugs available, Suction available and Patient being monitored Patient Re-evaluated:Patient Re-evaluated prior to induction Oxygen Delivery Method: Circle System Utilized Preoxygenation: Pre-oxygenation with 100% oxygen Induction Type: IV induction Ventilation: Mask ventilation without difficulty Laryngoscope Size: Miller and 2 Grade View: Grade I Tube type: Oral Tube size: 7.0 mm Number of attempts: 1 Airway Equipment and Method: Stylet and Oral airway Placement Confirmation: ETT inserted through vocal cords under direct vision, positive ETCO2 and breath sounds checked- equal and bilateral Secured at: 21 cm Tube secured with: Tape Dental Injury: Teeth and Oropharynx as per pre-operative assessment

## 2022-11-05 NOTE — Discharge Instructions (Addendum)
°Dominique Aluisio, MD °Total Joint Specialist °[facility] °[address] °[place], [place] [address] °[phone] ° °ANTERIOR APPROACH TOTAL HIP REPLACEMENT POSTOPERATIVE DIRECTIONS ° ° ° ° °Hip Rehabilitation, Guidelines Following Surgery  °The results of a hip operation are greatly improved after range of motion and muscle strengthening exercises. Follow all safety measures which are given to protect your hip. If any of these exercises cause increased pain or swelling in your joint, decrease the amount until you are comfortable again. Then slowly increase the exercises. Call your caregiver if you have problems or questions.  ° °BLOOD CLOT PREVENTION °Take a 325 mg Aspirin two times a day for three weeks following surgery. Then take an 81 mg Aspirin once a day for three weeks. Then discontinue Aspirin. °You may resume your vitamins/supplements upon discharge from the hospital. °Do not take any NSAIDs (Advil, Aleve, Ibuprofen, Meloxicam, etc.) until you have discontinued the 325 mg Aspirin. ° °HOME CARE INSTRUCTIONS  °Remove items at home which could result in a fall. This includes throw rugs or furniture in walking pathways.  °ICE to the affected hip as frequently as 20-30 minutes an hour and then as needed for pain and swelling. Continue to use ice on the hip for pain and swelling from surgery. You may notice swelling that will progress down to the foot and ankle. This is normal after surgery. Elevate the leg when you are not up walking on it.   °Continue to use the breathing machine which will help keep your temperature down.  It is common for your temperature to cycle up and down following surgery, especially at night when you are not up moving around and exerting yourself.  The breathing machine keeps your lungs expanded and your temperature down. ° °DIET °You may resume your previous home diet once your are discharged from the hospital. ° °DRESSING / WOUND CARE / SHOWERING °You have  an adhesive waterproof bandage over the incision. Leave this in place until your first follow-up appointment. Once you remove this you will not need to place another bandage.  °You may begin showering 3 days following surgery, but do not submerge the incision under water. ° °ACTIVITY °For the first 3-5 days, it is important to rest and keep the operative leg elevated. You should, as a general rule, rest for 50 minutes and walk/stretch for 10 minutes per hour. After 5 days, you may slowly increase activity as tolerated.  °Perform the exercises you were provided twice a day for about 15-20 minutes each session. Begin these 2 days following surgery. °Walk with your walker as instructed. Use the walker until you are comfortable transitioning to a cane. Walk with the cane in the opposite hand of the operative leg. You may discontinue the cane once you are comfortable and walking steadily. °Avoid periods of inactivity such as sitting longer than an hour when not asleep. This helps prevent blood clots.  °Do not drive a car for 6 weeks or until released by your surgeon.  °Do not drive while taking narcotics. ° °TED HOSE STOCKINGS °Wear the elastic stockings on both legs for three weeks following surgery during the day. You may remove them at night while sleeping. ° °WEIGHT BEARING °Weight bearing as tolerated with assist device (walker, cane, etc) as directed, use it as long as suggested by your surgeon or therapist, typically at least 4-6 weeks. ° °POSTOPERATIVE CONSTIPATION PROTOCOL °Constipation - defined medically as fewer than three stools per week and severe constipation as   less than one stool per week. ° °One of the most common issues patients have following surgery is constipation.  Even if you have a regular bowel pattern at home, your normal regimen is likely to be disrupted due to multiple reasons following surgery.  Combination of anesthesia, postoperative narcotics, change in appetite and fluid intake all can  affect your bowels.  In order to avoid complications following surgery, here are some recommendations in order to help you during your recovery period. ° °Colace (docusate) - Pick up an over-the-counter form of Colace or another stool softener and take twice a day as long as you are requiring postoperative pain medications.  Take with a full glass of water daily.  If you experience loose stools or diarrhea, hold the colace until you stool forms back up.  If your symptoms do not get better within 1 week or if they get worse, check with your doctor. °Dulcolax (bisacodyl) - Pick up over-the-counter and take as directed by the product packaging as needed to assist with the movement of your bowels.  Take with a full glass of water.  Use this product as needed if not relieved by Colace only.  °MiraLax (polyethylene glycol) - Pick up over-the-counter to have on hand.  MiraLax is a solution that will increase the amount of water in your bowels to assist with bowel movements.  Take as directed and can mix with a glass of water, juice, soda, coffee, or tea.  Take if you go more than two days without a movement.Do not use MiraLax more than once per day. Call your doctor if you are still constipated or irregular after using this medication for 7 days in a row. ° °If you continue to have problems with postoperative constipation, please contact the office for further assistance and recommendations.  If you experience "the worst abdominal pain ever" or develop nausea or vomiting, please contact the office immediatly for further recommendations for treatment. ° °ITCHING ° If you experience itching with your medications, try taking only a single pain pill, or even half a pain pill at a time.  You can also use Benadryl over the counter for itching or also to help with sleep.  ° °MEDICATIONS °See your medication summary on the “After Visit Summary” that the nursing staff will review with you prior to discharge.  You may have some home  medications which will be placed on hold until you complete the course of blood thinner medication.  It is important for you to complete the blood thinner medication as prescribed by your surgeon.  Continue your approved medications as instructed at time of discharge. ° °PRECAUTIONS °If you experience chest pain or shortness of breath - call 911 immediately for transfer to the hospital emergency department.  °If you develop a fever greater that 101 F, purulent drainage from wound, increased redness or drainage from wound, foul odor from the wound/dressing, or calf pain - CONTACT YOUR SURGEON.   °                                                °FOLLOW-UP APPOINTMENTS °Make sure you keep all of your appointments after your operation with your surgeon and caregivers. You should call the office at the above phone number and make an appointment for approximately two weeks after the date of your surgery or on the   date instructed by your surgeon outlined in the "After Visit Summary". ° °RANGE OF MOTION AND STRENGTHENING EXERCISES  °These exercises are designed to help you keep full movement of your hip joint. Follow your caregiver's or physical therapist's instructions. Perform all exercises about fifteen times, three times per day or as directed. Exercise both hips, even if you have had only one joint replacement. These exercises can be done on a training (exercise) mat, on the floor, on a table or on a bed. Use whatever works the best and is most comfortable for you. Use music or television while you are exercising so that the exercises are a pleasant break in your day. This will make your life better with the exercises acting as a break in routine you can look forward to.  °Lying on your back, slowly slide your foot toward your buttocks, raising your knee up off the floor. Then slowly slide your foot back down until your leg is straight again.  °Lying on your back spread your legs as far apart as you can without causing  discomfort.  °Lying on your side, raise your upper leg and foot straight up from the floor as far as is comfortable. Slowly lower the leg and repeat.  °Lying on your back, tighten up the muscle in the front of your thigh (quadriceps muscles). You can do this by keeping your leg straight and trying to raise your heel off the floor. This helps strengthen the largest muscle supporting your knee.  °Lying on your back, tighten up the muscles of your buttocks both with the legs straight and with the knee bent at a comfortable angle while keeping your heel on the floor.  ° °POST-OPERATIVE OPIOID TAPER INSTRUCTIONS: °It is important to wean off of your opioid medication as soon as possible. If you do not need pain medication after your surgery it is ok to stop day one. °Opioids include: °Codeine, Hydrocodone(Norco, Vicodin), Oxycodone(Percocet, oxycontin) and hydromorphone amongst others.  °Long term and even short term use of opiods can cause: °Increased pain response °Dependence °Constipation °Depression °Respiratory depression °And more.  °Withdrawal symptoms can include °Flu like symptoms °Nausea, vomiting °And more °Techniques to manage these symptoms °Hydrate well °Eat regular healthy meals °Stay active °Use relaxation techniques(deep breathing, meditating, yoga) °Do Not substitute Alcohol to help with tapering °If you have been on opioids for less than two weeks and do not have pain than it is ok to stop all together.  °Plan to wean off of opioids °This plan should start within one week post op of your joint replacement. °Maintain the same interval or time between taking each dose and first decrease the dose.  °Cut the total daily intake of opioids by one tablet each day °Next start to increase the time between doses. °The last dose that should be eliminated is the evening dose.  ° °IF YOU ARE TRANSFERRED TO A SKILLED REHAB FACILITY °If the patient is transferred to a skilled rehab facility following release from the  hospital, a list of the current medications will be sent to the facility for the patient to continue.  When discharged from the skilled rehab facility, please have the facility set up the patient's Home Health Physical Therapy prior to being released. Also, the skilled facility will be responsible for providing the patient with their medications at time of release from the facility to include their pain medication, the muscle relaxants, and their blood thinner medication. If the patient is still at the rehab facility   at time of the two week follow up appointment, the skilled rehab facility will also need to assist the patient in arranging follow up appointment in our office and any transportation needs. ° °MAKE SURE YOU:  °Understand these instructions.  °Get help right away if you are not doing well or get worse.  ° ° °DENTAL ANTIBIOTICS: ° °In most cases prophylactic antibiotics for Dental procdeures after total joint surgery are not necessary. ° °Exceptions are as follows: ° °1. History of prior total joint infection ° °2. Severely immunocompromised (Organ Transplant, cancer chemotherapy, Rheumatoid biologic °meds such as Humera) ° °3. Poorly controlled diabetes (A1C &gt; 8.0, blood glucose over 200) ° °If you have one of these conditions, contact your surgeon for an antibiotic prescription, prior to your °dental procedure.  ° ° °Pick up stool softner and laxative for home use following surgery while on pain medications. °Do not submerge incision under water. °Please use good hand washing techniques while changing dressing each day. °May shower starting three days after surgery. °Please use a clean towel to pat the incision dry following showers. °Continue to use ice for pain and swelling after surgery. °Do not use any lotions or creams on the incision until instructed by your surgeon. ° °

## 2022-11-05 NOTE — TOC Initial Note (Signed)
Transition of Care [facility]) - Initial/Assessment Note    Patient Details  Name: Dominique Livingston MRN: [id number] Date of Birth: [birth year]  [facility] CM/SW Contact:    Rodney Booze, LCSW Phone Number: 11/05/2022, 2:05 PM  Clinical Narrative:                 CSW spoke to the daughter, at this time they are wanting to have the mom go to a rehab to recover from the hip surgery. The daughter was informed of the process once and after PT seen the patient. This CSW also gave the patient medicare. Gov site so that she could at least start looking into facilities. The daughter and her brother will help facilitate their mom's DC. TOC will continue to follow the patient for DC arrangements.   Expected Discharge Plan: Skilled Nursing Facility Barriers to Discharge: No Barriers Identified   Patient Goals and CMS Choice Patient states their goals for this hospitalization and ongoing recovery are:: To get stonger CMS Medicare.gov Compare Post Acute Care list provided to:: Other (Comment Required) (Patient daughter, CSW advised to look at [facility])        Expected Discharge Plan and Services In-house Referral: Clinical Social Work     Living arrangements for the past 2 months: Single Family Home                                      Prior Living Arrangements/Services Living arrangements for the past 2 months: Single Family Home Lives with:: Self Patient language and need for interpreter reviewed:: No Do you feel safe going back to the place where you live?: Yes      Need for Family Participation in Patient Care: No (Comment) Care giver support system in place?: Yes (comment)   Criminal Activity/Legal Involvement Pertinent to Current Situation/Hospitalization: No - Comment as needed  Activities of Daily Living Home Assistive Devices/Equipment: None ADL Screening (condition at time of admission) Patient's cognitive ability adequate to safely complete daily  activities?: Yes Is the patient deaf or have difficulty hearing?: No Does the patient have difficulty seeing, even when wearing glasses/contacts?: No Does the patient have difficulty concentrating, remembering, or making decisions?: No Patient able to express need for assistance with ADLs?: Yes Does the patient have difficulty dressing or bathing?: Yes Independently performs ADLs?: Yes (appropriate for developmental age) Does the patient have difficulty walking or climbing stairs?: Yes Weakness of Legs: Right Weakness of Arms/Hands: None  Permission Sought/Granted                  Emotional Assessment Appearance:: Appears stated age     Orientation: : Oriented to Self, Oriented to Place, Oriented to  Time, Oriented to Situation Alcohol / Substance Use: Never Used Psych Involvement: No (comment)  Admission diagnosis:  Hip fracture ([place]) [S72.009A] Closed fracture of neck of right femur, initial encounter ([place]) [S72.001A] Sprain of anterior talofibular ligament of right ankle, initial encounter [G62.694W] Patient Active Problem List   Diagnosis Date Noted   Hip fracture ([place]) 11/04/2022   Diarrhea    Generalized abdominal pain    Colitis 04/08/2022   AKI (acute kidney injury) ([place]) 04/08/2022   Allergic rhinitis 03/16/2014   History of polymyalgia rheumatica 03/16/2014   Essential hypertension 03/08/2012   Hyperlipidemia 03/08/2012   Hypothyroidism 03/08/2012   PCP:  Elby Showers, MD Pharmacy:   [facility] [place] - [address] [place] [address] Phone: [phone] Fax: [phone]     Social Determinants of Health (SDOH) Social History: SDOH Screenings   Food Insecurity: No Food Insecurity (11/04/2022)  Housing: Low Risk  (11/04/2022)  Transportation Needs: No Transportation Needs (11/04/2022)  Utilities: Not At Risk (11/04/2022)  Depression (PHQ2-9): Low Risk  (06/22/2022)  Tobacco Use: Low Risk   (11/05/2022)   SDOH Interventions:     Readmission Risk Interventions    11/05/2022    2:02 PM  Readmission Risk Prevention Plan  Post Dischage Appt Complete  Medication Screening Not Complete  Transportation Screening Complete

## 2022-11-05 NOTE — Anesthesia Preprocedure Evaluation (Addendum)
Anesthesia Evaluation  Patient identified by MRN, date of birth, ID band Patient awake    Reviewed: Allergy & Precautions, NPO status , Patient's Chart, lab work & pertinent test results  History of Anesthesia Complications Negative for: history of anesthetic complications  Airway Mallampati: II  TM Distance: >3 FB Neck ROM: Full   Comment: Previous grade I view with Miller 2, easy mask Dental  (+) Teeth Intact, Dental Advisory Given   Pulmonary neg pulmonary ROS   Pulmonary exam normal breath sounds clear to auscultation       Cardiovascular hypertension (lisinopril), Pt. on medications (-) angina (-) Past MI, (-) Cardiac Stents and (-) CABG (-) dysrhythmias  Rhythm:Regular Rate:Normal  HLD   Neuro/Psych  PSYCHIATRIC DISORDERS Anxiety     negative neurological ROS     GI/Hepatic Neg liver ROS,GERD  ,,Diverticulosis, IBS   Endo/Other  neg diabetesHypothyroidism    Renal/GU negative Renal ROS     Musculoskeletal  (+) Arthritis ,  PMR   Abdominal  (+) - obese  Peds  Hematology negative hematology ROS (+)   Anesthesia Other Findings   Reproductive/Obstetrics endometriosis                             Anesthesia Physical Anesthesia Plan  ASA: 3  Anesthesia Plan: General   Post-op Pain Management:    Induction: Intravenous  PONV Risk Score and Plan: 3 and Ondansetron, Dexamethasone and Treatment may vary due to age or medical condition  Airway Management Planned: Oral ETT  Additional Equipment:   Intra-op Plan:   Post-operative Plan: Extubation in OR  Informed Consent: I have reviewed the patients History and Physical, chart, labs and discussed the procedure including the risks, benefits and alternatives for the proposed anesthesia with the patient or authorized representative who has indicated his/her understanding and acceptance.     Dental advisory given  Plan Discussed  with: CRNA and Anesthesiologist  Anesthesia Plan Comments: (Risks of general anesthesia discussed including, but not limited to, sore throat, hoarse voice, chipped/damaged teeth, injury to vocal cords, nausea and vomiting, allergic reactions, lung infection, heart attack, stroke, and death. All questions answered. )        Anesthesia Quick Evaluation

## 2022-11-05 NOTE — Anesthesia Postprocedure Evaluation (Signed)
Anesthesia Post Note  Patient: Dominique Livingston  Procedure(s) Performed: TOTAL HIP ARTHROPLASTY ANTERIOR APPROACH (Right: Hip)     Patient location during evaluation: PACU Anesthesia Type: General Level of consciousness: awake Pain management: pain level controlled Vital Signs Assessment: post-procedure vital signs reviewed and stable Respiratory status: spontaneous breathing, nonlabored ventilation and respiratory function stable Cardiovascular status: blood pressure returned to baseline and stable Postop Assessment: no apparent nausea or vomiting Anesthetic complications: no   No notable events documented.  Last Vitals:  Vitals:   11/05/22 1000 11/05/22 1015  BP: 114/61   Pulse: 64   Resp: 13   Temp:  37.1 C  SpO2: 97%     Last Pain:  Vitals:   11/05/22 1003  TempSrc:   PainSc: 0-No pain    LLE Motor Response: Purposeful movement;Responds to commands (11/05/22 1003)   RLE Motor Response: Purposeful movement;Responds to commands (11/05/22 1003)        Nilda Simmer

## 2022-11-05 NOTE — Progress Notes (Signed)
OT Cancellation Note  Patient Details Name: Dominique Livingston MRN: [id number] DOB: 31-May-1933   Cancelled Treatment:    Reason Eval/Treat Not Completed: Patient at procedure or test/ unavailable Patient is off hall for anterior THA at this time. OT to continue to follow and check back as schedule will allow. Rennie Plowman, [place] Acute Rehabilitation Department Office# [phone]  11/05/2022, 7:23 AM

## 2022-11-05 NOTE — Hospital Course (Signed)
Dominique Livingston is a 89 y.o. female with a history of GERD, hyperthyroidism, hypertension, diverticulosis, IBS, hyperlipidemia,. Patient presented secondary to right leg pain after fall and found to have a mildly displaced right femoral neck fracture. Orthopedic surgery was consulted and performed a successful right total hip arthroplasty on 2/4. PT/OT consulted.

## 2022-11-05 NOTE — Op Note (Signed)
OPERATIVE REPORT- TOTAL HIP ARTHROPLASTY   PREOPERATIVE DIAGNOSIS: Right femoral neck fracture  POSTOPERATIVE DIAGNOSIS: Right femoral neck fracture  PROCEDURE: Right total hip arthroplasty, anterior approach.   SURGEON: Gaynelle Arabian, MD   ASSISTANT: Jaynie Bream, PA-C  ANESTHESIA:  General  ESTIMATED BLOOD LOSS:-150 mL    DRAINS: None  COMPLICATIONS: None   CONDITION: PACU - hemodynamically stable.   BRIEF CLINICAL NOTE: Dominique Livingston is a 89 y.o. female who had a fall Friday evening 2/2 with a  Displaced right femoral neck fracture.The patient presents for right total hip arthroplasty.   PROCEDURE IN DETAIL: After successful administration of spinal  anesthetic, the traction boots for the [facility] bed were placed on both  feet and the patient was placed onto the [facility] bed, boots placed into the leg  holders. The Right hip was then isolated from the perineum with plastic  drapes and prepped and draped in the usual sterile fashion. ASIS and  greater trochanter were marked and a oblique incision was made, starting  at about 1 cm lateral and 2 cm distal to the ASIS and coursing towards  the anterior cortex of the femur. The skin was cut with a 10 blade  through subcutaneous tissue to the level of the fascia overlying the  tensor fascia lata muscle. The fascia was then incised in line with the  incision at the junction of the anterior third and posterior 2/3rd. The  muscle was teased off the fascia and then the interval between the TFL  and the rectus was developed. The Hohmann retractor was then placed at  the top of the femoral neck over the capsule. The vessels overlying the  capsule were cauterized and the fat on top of the capsule was removed.  A Hohmann retractor was then placed anterior underneath the rectus  femoris to give exposure to the entire anterior capsule. A T-shaped  capsulotomy was performed. The edges were tagged and the femoral head  was  identified as was the fracture and fracture hematoma.       Osteophytes are removed off the superior acetabulum.  The femoral neck was then cut in situ with an oscillating saw. Traction  was then applied to the left lower extremity utilizing the [facility]  traction. The femoral head was then removed. Retractors were placed  around the acetabulum and then circumferential removal of the labrum was  performed. Osteophytes were also removed. Reaming starts at 47 mm to  medialize and  Increased in 2 mm increments to 49 mm. We reamed in  approximately 40 degrees of abduction, 20 degrees anteversion. A 50 mm  pinnacle acetabular shell was then impacted in anatomic position under  fluoroscopic guidance with excellent purchase. We did not need to place  any additional dome screws. A 32 mm neutral + 4 marathon liner was then  placed into the acetabular shell.       The femoral lift was then placed along the lateral aspect of the femur  just distal to the vastus ridge. The leg was  externally rotated and capsule  was stripped off the inferior aspect of the femoral neck down to the  level of the lesser trochanter, this was done with electrocautery. The femur was lifted after this was performed. The  leg was then placed in an extended and adducted position essentially delivering the femur. We also removed the capsule superiorly and the piriformis from the piriformis fossa to gain excellent exposure of the  proximal  femur. Rongeur was used to remove some cancellous bone to get  into the lateral portion of the proximal femur for placement of the  initial starter reamer. The starter broaches was placed  the starter broach  and was shown to go down the center of the canal. Broaching  with the Actis system was then performed starting at size 0  coursing  Up to size 6. A size 6 had excellent torsional and rotational  and axial stability. The trial standard offset neck was then placed  with a 32 + 5 trial head. The  hip was then reduced. We confirmed that  the stem was in the canal both on AP and lateral x-rays. It also has excellent sizing. The hip was reduced with outstanding stability through full extension and full external rotation.. AP pelvis was taken and the leg lengths were measured and found to be equal. Hip was then dislocated again and the femoral head and neck removed. The  femoral broach was removed. Size 6 Actis stem with a standard offset  neck was then impacted into the femur following native anteversion. Has  excellent purchase in the canal. Excellent torsional and rotational and  axial stability. It is confirmed to be in the canal on AP and lateral  fluoroscopic views. The 32 + 5 metal head was placed and the hip  reduced with outstanding stability. Again AP pelvis was taken and it  confirmed that the leg lengths were equal. The wound was then copiously  irrigated with saline solution and the capsule reattached and repaired  with Ethibond suture. 30 ml of .25% Bupivicaine was  injected into the capsule and into the edge of the tensor fascia lata as well as subcutaneous tissue. The fascia overlying the tensor fascia lata was then closed with a running #1 V-Loc. Subcu was closed with interrupted 2-0 Vicryl and subcuticular running 4-0 Monocryl. Incision was cleaned  and dried. Steri-Strips and a bulky sterile dressing applied. The patient was awakened and transported to  recovery in stable condition.        Please note that a surgical assistant was a medical necessity for this procedure to perform it in a safe and expeditious manner. Assistant was necessary to provide appropriate retraction of vital neurovascular structures and to prevent femoral fracture and allow for anatomic placement of the prosthesis.  Gaynelle Arabian, M.D.

## 2022-11-05 NOTE — Progress Notes (Signed)
PROGRESS NOTE    RAYVON BRANDVOLD  ZJI:[id number] DOB: [birth year] DOA: 11/03/2022 PCP: Elby Showers, MD   Brief Narrative: CHARLINE HOSKINSON is a 89 y.o. female with a history of GERD, hyperthyroidism, hypertension, diverticulosis, IBS, hyperlipidemia,. Patient presented secondary to right leg pain after fall and found to have a mildly displaced right femoral neck fracture. Orthopedic surgery was consulted and performed a successful right total hip arthroplasty on 2/4. PT/OT consulted.   Assessment and Plan:  Right hip fracture Mildly displaced, secondary to fall. Orthopedic surgery consulted and performed a successful right total hip arthroplasty on 2/4 -Orthopedic surgery recommendations: pending today. PT/OT recommendations pending CBC in AM  Right ankle avulsion fracture Right ankle sprain -Orthopedic surgery recommendations:   Primary hypertension -Continue lisinopril  Hypothyroidism -Continue Synthroid  Anxiety -Continue Xanax prn   DVT prophylaxis: Per orthopedic surgery (Aspirin 325 mg BID) Code Status:   Code Status: Full Code Family Communication: Daughter and son-in-law at bedside Disposition Plan: Discharge home vs SNF likely in 1-2 days pending PT/OT recommendations and orthopedic surgery recommendations   Consultants:  Orthopedic surgery  Procedures:  2/4: Right total hip arthroplasty  Antimicrobials: None    Subjective: Patient reports improvement of leg pain.  Objective: BP (!) 133/51 (BP Location: Left Arm)   Pulse 63   Temp (!) 97.5 F (36.4 C) (Oral) Comment (Src): taken both oral and axillary  Resp 12   Ht 5\' 2"  (1.575 m)   Wt 59 kg   SpO2 100%   BMI 23.79 kg/m   Examination:  General exam: Appears calm and comfortable Respiratory system: Clear to auscultation. Respiratory effort normal. Cardiovascular system: S1 & S2 heard, RRR. No murmurs, rubs, gallops or clicks. Gastrointestinal system: Abdomen is nondistended, soft and  nontender. No organomegaly or masses felt. Normal bowel sounds heard. Central nervous system: Alert and oriented. No focal neurological deficits. Musculoskeletal: No edema. No calf tenderness Skin: No cyanosis. No rashes. No ecchymosis noted over incision site Psychiatry: Judgement and insight appear normal. Mood & affect appropriate.    Data Reviewed: I have personally reviewed following labs and imaging studies  CBC Lab Results  Component Value Date   WBC 8.8 11/04/2022   RBC 3.45 (L) 11/04/2022   HGB 10.9 (L) 11/04/2022   HCT 32.5 (L) 11/04/2022   MCV 94.2 11/04/2022   MCH 31.6 11/04/2022   PLT 148 (L) 11/04/2022   MCHC 33.5 11/04/2022   RDW 12.5 11/04/2022   LYMPHSABS 1.6 11/03/2022   MONOABS 0.5 11/03/2022   EOSABS 0.0 11/03/2022   BASOSABS 0.1 17/51/0258     Last metabolic panel Lab Results  Component Value Date   NA 139 11/04/2022   K 4.4 11/04/2022   CL 106 11/04/2022   CO2 25 11/04/2022   BUN 15 11/04/2022   CREATININE 0.87 11/04/2022   GLUCOSE 135 (H) 11/04/2022   GFRNONAA >60 11/04/2022   GFRAA 72 05/28/2020   CALCIUM 8.8 (L) 11/04/2022   PROT 8.4 (H) 06/22/2022   ALBUMIN 4.5 04/08/2022   BILITOT 0.5 06/22/2022   ALKPHOS 56 04/08/2022   AST 39 (H) 06/22/2022   ALT 57 (H) 06/22/2022   ANIONGAP 8 11/04/2022    GFR: Estimated Creatinine Clearance: 36 mL/min (by C-G formula based on SCr of 0.87 mg/dL).  Recent Results (from the past 240 hour(s))  Surgical pcr screen     Status: None   Collection Time: 11/04/22 11:51 PM   Specimen: Nasal Mucosa; Nasal Swab  Result Value Ref Range  Status   MRSA, PCR NEGATIVE NEGATIVE Final   Staphylococcus aureus NEGATIVE NEGATIVE Final    Comment: (NOTE) The Xpert SA Assay (FDA approved for NASAL specimens in patients 21 years of age and older), is one component of a comprehensive surveillance program. It is not intended to diagnose infection nor to guide or monitor treatment. Performed at [facility], [place] [address], [place], [place] [address]       Radiology Studies: DG HIP UNILAT WITH PELVIS 1V RIGHT  Result Date: 11/05/2022 CLINICAL DATA:  Status post right hip arthroplasty. EXAM: DG HIP (WITH OR WITHOUT PELVIS) 1V RIGHT COMPARISON:  11/03/2022 FINDINGS: Two images obtained via portable C-arm radiography in the operating room were submitted. There is been interval right total hip arthroplasty. Hardware components are in anatomic alignment. Surgical drainage catheter and surgical sponge are identified in lateral to right hip. IMPRESSION: Status post right total hip arthroplasty. Surgical drainage catheter and sponge noted lateral to the right hip. Electronically Signed   By: Kerby Moors M.D.   On: 11/05/2022 09:33   DG C-Arm 1-60 Min-No Report  Result Date: 11/05/2022 Fluoroscopy was utilized by the requesting physician.  No radiographic interpretation.   DG C-Arm 1-60 Min-No Report  Result Date: 11/05/2022 Fluoroscopy was utilized by the requesting physician.  No radiographic interpretation.   DG Hip Unilat With Pelvis 2-3 Views Right  Addendum Date: 11/04/2022   ADDENDUM REPORT: 11/04/2022 00:46 ADDENDUM: Right ankle: Bony densities are noted inferior to the lateral malleolus suggesting avulsion fractures of indeterminate age. No dislocation. Electronically Signed   By: Brett Fairy M.D.   On: 11/04/2022 00:46   Result Date: 11/04/2022 CLINICAL DATA:  Fall, hip injury. EXAM: RIGHT ANKLE - COMPLETE 3+ VIEW; DG HIP (WITH OR WITHOUT PELVIS) 2-3V RIGHT COMPARISON:  None Available. FINDINGS: There is a fracture involving the femoral neck on the right with superior subluxation of the distal fracture fragment. No dislocation. The remaining bony structures are intact. The soft tissues are unremarkable. IMPRESSION: Mildly displaced femoral neck fracture on the right. Electronically Signed: By: Brett Fairy M.D. On: 11/03/2022 23:51   DG Ankle Complete Right  Addendum Date:  11/04/2022   ADDENDUM REPORT: 11/04/2022 00:46 ADDENDUM: Right ankle: Bony densities are noted inferior to the lateral malleolus suggesting avulsion fractures of indeterminate age. No dislocation. Electronically Signed   By: Brett Fairy M.D.   On: 11/04/2022 00:46   Result Date: 11/04/2022 CLINICAL DATA:  Fall, hip injury. EXAM: RIGHT ANKLE - COMPLETE 3+ VIEW; DG HIP (WITH OR WITHOUT PELVIS) 2-3V RIGHT COMPARISON:  None Available. FINDINGS: There is a fracture involving the femoral neck on the right with superior subluxation of the distal fracture fragment. No dislocation. The remaining bony structures are intact. The soft tissues are unremarkable. IMPRESSION: Mildly displaced femoral neck fracture on the right. Electronically Signed: By: Brett Fairy M.D. On: 11/03/2022 23:51   DG Chest Portable 1 View  Result Date: 11/03/2022 CLINICAL DATA:  [place], hip fracture, preoperative evaluation EXAM: PORTABLE CHEST 1 VIEW COMPARISON:  08/15/2013 FINDINGS: Single frontal view of the chest demonstrates an unremarkable cardiac silhouette. No airspace disease, effusion, or pneumothorax. No acute bony abnormality. IMPRESSION: 1. No acute intrathoracic process. Electronically Signed   By: Randa Ngo M.D.   On: 11/03/2022 23:45      LOS: 1 day    Cordelia Poche, MD [facility] 11/05/2022, 3:03 PM   If 7PM-7AM, please contact night-coverage [email or web address]

## 2022-11-05 NOTE — Transfer of Care (Signed)
Immediate Anesthesia Transfer of Care Note  Patient: Dominique Livingston  Procedure(s) Performed: TOTAL HIP ARTHROPLASTY ANTERIOR APPROACH (Right: Hip)  Patient Location: PACU  Anesthesia Type:General  Level of Consciousness: awake, oriented, and patient cooperative  Airway & Oxygen Therapy: Patient Spontanous Breathing and Patient connected to face mask oxygen  Post-op Assessment: Report given to RN and Post -op Vital signs reviewed and stable  Post vital signs: Reviewed  Last Vitals:  Vitals Value Taken Time  BP 128/50 11/05/22 0949  Temp    Pulse 66 11/05/22 0951  Resp 13 11/05/22 0951  SpO2 99 % 11/05/22 0951  Vitals shown include unvalidated device data.  Last Pain:  Vitals:   11/05/22 0042  TempSrc:   PainSc: Asleep      Patients Stated Pain Goal: 0 (76/81/15 7262)  Complications: No notable events documented.

## 2022-11-05 NOTE — Interval H&P Note (Signed)
History and Physical Interval Note:  11/05/2022 7:00 AM  Dominique Livingston  has presented today for surgery, with the diagnosis of RIGHT FEMORAL NECK FRACTURE.  The various methods of treatment have been discussed with the patient and family. After consideration of risks, benefits and other options for treatment, the patient has consented to  Procedure(s): TOTAL HIP ARTHROPLASTY ANTERIOR APPROACH (Right) as a surgical intervention.  The patient's history has been reviewed, patient examined, no change in status, stable for surgery.  I have reviewed the patient's chart and labs.  Questions were answered to the patient's satisfaction.     Pilar Plate Liliana Dang

## 2022-11-06 ENCOUNTER — Encounter (HOSPITAL_COMMUNITY): Payer: Self-pay | Admitting: Orthopedic Surgery

## 2022-11-06 DIAGNOSIS — S82891A Other fracture of right lower leg, initial encounter for closed fracture: Secondary | ICD-10-CM | POA: Diagnosis not present

## 2022-11-06 DIAGNOSIS — I10 Essential (primary) hypertension: Secondary | ICD-10-CM | POA: Diagnosis not present

## 2022-11-06 DIAGNOSIS — F419 Anxiety disorder, unspecified: Secondary | ICD-10-CM | POA: Diagnosis not present

## 2022-11-06 DIAGNOSIS — S72001A Fracture of unspecified part of neck of right femur, initial encounter for closed fracture: Secondary | ICD-10-CM | POA: Diagnosis not present

## 2022-11-06 LAB — TYPE AND SCREEN
ABO/RH(D): A POS
Antibody Screen: POSITIVE
Donor AG Type: NEGATIVE
Donor AG Type: NEGATIVE
PT AG Type: NEGATIVE
Unit division: 0
Unit division: 0

## 2022-11-06 LAB — CBC
HCT: 27.9 % — ABNORMAL LOW (ref 36.0–46.0)
Hemoglobin: 9 g/dL — ABNORMAL LOW (ref 12.0–15.0)
MCH: 31 pg (ref 26.0–34.0)
MCHC: 32.3 g/dL (ref 30.0–36.0)
MCV: 96.2 fL (ref 80.0–100.0)
Platelets: 132 10*3/uL — ABNORMAL LOW (ref 150–400)
RBC: 2.9 MIL/uL — ABNORMAL LOW (ref 3.87–5.11)
RDW: 12.6 % (ref 11.5–15.5)
WBC: 8.9 10*3/uL (ref 4.0–10.5)
nRBC: 0 % (ref 0.0–0.2)

## 2022-11-06 LAB — BASIC METABOLIC PANEL
Anion gap: 7 (ref 5–15)
BUN: 20 mg/dL (ref 8–23)
CO2: 25 mmol/L (ref 22–32)
Calcium: 8.2 mg/dL — ABNORMAL LOW (ref 8.9–10.3)
Chloride: 107 mmol/L (ref 98–111)
Creatinine, Ser: 0.76 mg/dL (ref 0.44–1.00)
GFR, Estimated: >60 mL/min (ref >60)
Glucose, Bld: 112 mg/dL — ABNORMAL HIGH (ref 70–99)
Potassium: 4.1 mmol/L (ref 3.5–5.1)
Sodium: 139 mmol/L (ref 135–145)

## 2022-11-06 LAB — BPAM RBC
Blood Product Expiration Date: 202402142359
Blood Product Expiration Date: 202402142359
Unit Type and Rh: 6200
Unit Type and Rh: 6200

## 2022-11-06 LAB — MISC LABCORP TEST (SEND OUT): Labcorp test code: 81950

## 2022-11-06 MED ORDER — ENSURE ENLIVE PO LIQD
237.0000 mL | Freq: Two times a day (BID) | ORAL | Status: DC
  Administered 2022-11-06: 237 mL via ORAL

## 2022-11-06 NOTE — Evaluation (Signed)
Occupational Therapy Evaluation Patient Details Name: Dominique Livingston MRN: [id number] DOB: 12-31-1932 Today's Date: 11/06/2022   History of Present Illness Patient is a 89 y.o. female who presented secondary to right leg pain after fall and found to have a mildly displaced right femoral neck fracture and R ankle sprain/avulsion fx. Orthopedic surgery was consulted and pt is now s/p R AA right total hip arthroplasty on 2/4  PMH: of GERD, hyperthyroidism, hypertension, diverticulosis, IBS, hyperlipidemia,.   Clinical Impression   Patient is a 89 year old female who was admitted for above. Patient was living at home independently prior level. Currently, patient is min A for transfers with RW with increased time and noted pain with task. Patient is motivated to be able to return home soon. Patient was noted to have decreased functional activity tolerance, decreased endurance, decreased standing balance, decreased safety awareness, and decreased knowledge of AD/AE impacting participation in ADLs. Patient would continue to benefit from skilled OT services at this time while admitted and after d/c to address noted deficits in order to improve overall safety and independence in ADLs.        Recommendations for follow up therapy are one component of a multi-disciplinary discharge planning process, led by the attending physician.  Recommendations may be updated based on patient status, additional functional criteria and insurance authorization.   Follow Up Recommendations  Acute inpatient rehab (3hours/day)     Assistance Recommended at Discharge Frequent or constant Supervision/Assistance  Patient can return home with the following A little help with walking and/or transfers;A lot of help with bathing/dressing/bathroom;Assistance with cooking/housework;Direct supervision/assist for medications management;Assist for transportation;Direct supervision/assist for financial management;Help with stairs or ramp  for entrance    Functional Status Assessment  Patient has had a recent decline in their functional status and demonstrates the ability to make significant improvements in function in a reasonable and predictable amount of time.  Equipment Recommendations  Other (comment) (total hip kit)    Recommendations for Other Services       Precautions / Restrictions Precautions Precautions: Fall Required Braces or Orthoses: Other Brace Other Brace: R ankle ASO Restrictions Weight Bearing Restrictions: No Other Position/Activity Restrictions: WBAT      Mobility Bed Mobility Overal bed mobility: Needs Assistance Bed Mobility: Supine to Sit     Supine to sit: Min assist     General bed mobility comments: with education on using gait belt as leg lifted to get in and out of bed.       Balance Overall balance assessment: Needs assistance Sitting-balance support: Feet supported, No upper extremity supported Sitting balance-Leahy Scale: Good     Standing balance support: Reliant on assistive device for balance, During functional activity, Bilateral upper extremity supported Standing balance-Leahy Scale: Poor         ADL either performed or assessed with clinical judgement   ADL Overall ADL's : Needs assistance/impaired Eating/Feeding: Set up;Sitting   Grooming: Wash/dry face;Wash/dry hands;Standing;Min guard Grooming Details (indicate cue type and reason): at sink with RW Upper Body Bathing: Min guard;Sitting   Lower Body Bathing: Maximal assistance;Sit to/from stand;Sitting/lateral leans   Upper Body Dressing : Min guard;Sitting   Lower Body Dressing: Sitting/lateral leans;Sit to/from stand;Maximal assistance Lower Body Dressing Details (indicate cue type and reason): simulated Toilet Transfer: Minimal assistance;Ambulation;Rolling walker (2 wheels) Toilet Transfer Details (indicate cue type and reason): to transfer from bed to commode in bathroom and back with cues for  proper sequencing of task. Toileting- Clothing Manipulation and Hygiene: Minimal  assistance;Sit to/from stand               Vision   Vision Assessment?: No apparent visual deficits            Pertinent Vitals/Pain Pain Assessment Pain Assessment: Faces Faces Pain Scale: Hurts little more Pain Location: right hip and ankle Pain Descriptors / Indicators: Discomfort, Sore Pain Intervention(s): Limited activity within patient's tolerance, Monitored during session, Repositioned        Extremity/Trunk Assessment Upper Extremity Assessment Upper Extremity Assessment: Overall WFL for tasks assessed   Lower Extremity Assessment Lower Extremity Assessment: Defer to PT evaluation       Communication Communication Communication: No difficulties   Cognition Arousal/Alertness: Awake/alert Behavior During Therapy: WFL for tasks assessed/performed Overall Cognitive Status: Within Functional Limits for tasks assessed         General Comments: patient is plesant and cooperative. daughter was present as well.                Home Living Family/patient expects to be discharged to:: Inpatient rehab Living Arrangements: Alone Available Help at Discharge: Family Type of Home: House                       Home Equipment: None          Prior Functioning/Environment Prior Level of Function : Independent/Modified Independent;Driving             Mobility Comments: pt very active, independent at baseline          OT Problem List: Decreased activity tolerance;Impaired balance (sitting and/or standing);Decreased coordination;Decreased safety awareness;Decreased knowledge of precautions;Pain      OT Treatment/Interventions: Self-care/ADL training;Energy conservation;Therapeutic exercise;DME and/or AE instruction;Therapeutic activities;Patient/family education;Balance training    OT Goals(Current goals can be found in the care plan section) Acute Rehab OT  Goals Patient Stated Goal: to return home OT Goal Formulation: With patient Time For Goal Achievement: 11/20/22 Potential to Achieve Goals: Fair  OT Frequency: Min 2X/week       AM-PAC OT "6 Clicks" Daily Activity     Outcome Measure Help from another person eating meals?: None Help from another person taking care of personal grooming?: A Little Help from another person toileting, which includes using toliet, bedpan, or urinal?: A Lot Help from another person bathing (including washing, rinsing, drying)?: A Lot Help from another person to put on and taking off regular upper body clothing?: A Little Help from another person to put on and taking off regular lower body clothing?: A Lot 6 Click Score: 16   End of Session Equipment Utilized During Treatment: Gait belt;Rolling walker (2 wheels) Nurse Communication: Mobility status  Activity Tolerance: Patient tolerated treatment well Patient left: in bed;with call bell/phone within reach;with bed alarm set;with family/visitor present  OT Visit Diagnosis: Unsteadiness on feet (R26.81);Other abnormalities of gait and mobility (R26.89);Muscle weakness (generalized) (M62.81)                Time: 8242-3536 OT Time Calculation (min): 21 min Charges:  OT General Charges $OT Visit: 1 Visit OT Evaluation $OT Eval Moderate Complexity: 1 Mod  Gean Laursen OTR/L, MS Acute Rehabilitation Department Office# [phone]   Willa Rough 11/06/2022, 5:11 PM

## 2022-11-06 NOTE — Evaluation (Signed)
Physical Therapy Evaluation Patient Details Name: Dominique Livingston MRN: [id number] DOB: 1933-03-01 Today's Date: 11/06/2022  History of Present Illness  89 y.o. female   presented secondary to right leg pain after fall and found to have a mildly displaced right femoral neck fracture and R ankle sprain/avulsion fx. Orthopedic surgery was consulted and pt is now s/p R AA right total hip arthroplasty on 2/4  PMH: of GERD, hyperthyroidism, hypertension, diverticulosis, IBS, hyperlipidemia,.  Clinical Impression  Pt admitted with above diagnosis.  Pt reports being very active and independent at baseline, motivated to return to same. Pt amb ~ 1' with RW and min to min/guard assist, anticipate steady progress. Pt has good family support (as much as needed per pt dtr). Pt would benefit from AIR post acute.   Pt currently with functional limitations due to the deficits listed below (see PT Problem List). Pt will benefit from skilled PT to increase their independence and safety with mobility to allow discharge to the venue listed below.          Recommendations for follow up therapy are one component of a multi-disciplinary discharge planning process, led by the attending physician.  Recommendations may be updated based on patient status, additional functional criteria and insurance authorization.  Follow Up Recommendations Acute inpatient rehab (3hours/day)      Assistance Recommended at Discharge Intermittent Supervision/Assistance  Patient can return home with the following  A little help with walking and/or transfers;A little help with bathing/dressing/bathroom;Assist for transportation;Help with stairs or ramp for entrance;Assistance with cooking/housework    Equipment Recommendations Other (comment) (defer to next venue)  Recommendations for Other Services  Rehab consult    Functional Status Assessment Patient has had a recent decline in their functional status and demonstrates the ability to  make significant improvements in function in a reasonable and predictable amount of time.     Precautions / Restrictions Precautions Precautions: Fall Required Braces or Orthoses: Other Brace Other Brace: R ankle ASO Restrictions Weight Bearing Restrictions: No Other Position/Activity Restrictions: WBAT      Mobility  Bed Mobility Overal bed mobility: Needs Assistance Bed Mobility: Supine to Sit     Supine to sit: Min assist     General bed mobility comments: assist to progress RLE off bed, verbal cues for sequence    Transfers Overall transfer level: Needs assistance Equipment used: Rolling walker (2 wheels) Transfers: Sit to/from Stand Sit to Stand: Min assist           General transfer comment: verbal cues for hand placement and RLE Position    Ambulation/Gait Ambulation/Gait assistance: Min guard, Min assist Gait Distance (Feet): 60 Feet Assistive device: Rolling walker (2 wheels) Gait Pattern/deviations: Step-to pattern Gait velocity: decr     General Gait Details: verbal cues for sequence and RW position, min assist to min/guard to steady, without overt LOB  Stairs            Wheelchair Mobility    Modified Rankin (Stroke Patients Only)       Balance Overall balance assessment: Needs assistance Sitting-balance support: Feet supported, No upper extremity supported Sitting balance-Leahy Scale: Good     Standing balance support: Reliant on assistive device for balance, During functional activity, Bilateral upper extremity supported Standing balance-Leahy Scale: Poor                               Pertinent Vitals/Pain Pain Assessment Pain Assessment: Faces Faces  Pain Scale: Hurts little more Pain Location: right hip and ankle Pain Descriptors / Indicators: Discomfort, Sore Pain Intervention(s): Limited activity within patient's tolerance, Monitored during session, Premedicated before session, Repositioned, Ice applied     Home Living Family/patient expects to be discharged to:: Inpatient rehab Living Arrangements: Alone Available Help at Discharge: Family Type of Home: House           Home Equipment: None      Prior Function Prior Level of Function : Independent/Modified Independent;Driving             Mobility Comments: pt very active, independent at baseline       Hand Dominance        Extremity/Trunk Assessment   Upper Extremity Assessment Upper Extremity Assessment: Defer to OT evaluation    Lower Extremity Assessment Lower Extremity Assessment: RLE deficits/detail RLE Deficits / Details: AAROM WFL, knee and hip 2+/5; ankle AROM grossly WFL per obs, NT further       Communication   Communication: No difficulties  Cognition Arousal/Alertness: Awake/alert Behavior During Therapy: WFL for tasks assessed/performed Overall Cognitive Status: Within Functional Limits for tasks assessed                                          General Comments      Exercises     Assessment/Plan    PT Assessment Patient needs continued PT services  PT Problem List Decreased range of motion;Decreased activity tolerance;Decreased balance;Decreased mobility;Decreased knowledge of precautions;Decreased knowledge of use of DME;Pain;Decreased strength       PT Treatment Interventions DME instruction;Therapeutic exercise;Gait training;Therapeutic activities;Functional mobility training;Patient/family education;Balance training    PT Goals (Current goals can be found in the Care Plan section)  Acute Rehab PT Goals Patient Stated Goal: be as independent as possible PT Goal Formulation: With patient Time For Goal Achievement: 11/20/22 Potential to Achieve Goals: Good    Frequency Min 4X/week     Co-evaluation               AM-PAC PT "6 Clicks" Mobility  Outcome Measure Help needed turning from your back to your side while in a flat bed without using bedrails?:  A Little Help needed moving from lying on your back to sitting on the side of a flat bed without using bedrails?: A Little Help needed moving to and from a bed to a chair (including a wheelchair)?: A Little Help needed standing up from a chair using your arms (e.g., wheelchair or bedside chair)?: A Little Help needed to walk in hospital room?: A Little Help needed climbing 3-5 steps with a railing? : A Lot 6 Click Score: 17    End of Session Equipment Utilized During Treatment: Gait belt Activity Tolerance: Patient tolerated treatment well Patient left: in chair;with call bell/phone within reach;with chair alarm set;with family/visitor present Nurse Communication: Mobility status PT Visit Diagnosis: Other abnormalities of gait and mobility (R26.89);Difficulty in walking, not elsewhere classified (R26.2)    Time: 9509-3267 PT Time Calculation (min) (ACUTE ONLY): 31 min   Charges:   PT Evaluation $PT Eval Low Complexity: 1 Low PT Treatments $Gait Training: 8-22 mins        Baxter Flattery, PT  Acute Rehab Dept [facility]) [phone]  [facility] Weekend Pager [facility] only)  [phone]  11/06/2022   [facility] 11/06/2022, 11:38 AM

## 2022-11-06 NOTE — Progress Notes (Signed)
PROGRESS NOTE    Dominique Livingston  YPP:[id number] DOB: Dec 27, 1932 DOA: 11/03/2022 PCP: Elby Showers, MD   Brief Narrative: Dominique Livingston is a 89 y.o. female with a history of GERD, hyperthyroidism, hypertension, diverticulosis, IBS, hyperlipidemia,. Patient presented secondary to right leg pain after fall and found to have a mildly displaced right femoral neck fracture. Orthopedic surgery was consulted and performed a successful right total hip arthroplasty on 2/4. PT/OT consulted and are recommending acute inpatient rehabilitation   Assessment and Plan:  Right hip fracture Mildly displaced, secondary to fall. Orthopedic surgery consulted and performed a successful right total hip arthroplasty on 2/4. PT recommending Inpatient Rehab -Orthopedic surgery recommendations: pending today. -CBC in AM  Right ankle avulsion fracture Right ankle sprain -Orthopedic surgery recommendations: pending today  Acute anemia Postoperative anemia Secondary to hip fracture. Baseline hemoglobin of 13. Hemoglobin of 11.1 on admission. Patient with a 1.9 g/dL drop in hemoglobin after surgery, likely related to acute blood loss anemia. -CBC in AM  Primary hypertension -Continue lisinopril  Hypothyroidism -Continue Synthroid  Anxiety -Continue Xanax prn   DVT prophylaxis: Per orthopedic surgery (Aspirin 325 mg BID) Code Status:   Code Status: Full Code Family Communication: None at bedside Disposition Plan: Discharge inpatient rehab vs SNF likely in 1-2 days pending orthopedic surgery recommendations and stable hemoglobin   Consultants:  Orthopedic surgery  Procedures:  2/4: Right total hip arthroplasty  Antimicrobials: None    Subjective: Patient has some pain of left hip but overall improved.  Objective: BP (!) 134/51 (BP Location: Left Arm)   Pulse 79   Temp 98.5 F (36.9 C) (Oral)   Resp 17   Ht 5\' 2"  (1.575 m)   Wt 59 kg   SpO2 97%   BMI 23.79 kg/m    Examination:  General exam: Appears calm and comfortable Respiratory system: Clear to auscultation. Respiratory effort normal. Cardiovascular system: S1 & S2 heard, RRR. Gastrointestinal system: Abdomen is nondistended, soft and nontender. Normal bowel sounds heard. Central nervous system: Alert and oriented. Skin: Right hip incision site without ecchymosis noted Psychiatry: Judgement and insight appear normal. Mood & affect appropriate.    Data Reviewed: I have personally reviewed following labs and imaging studies  CBC Lab Results  Component Value Date   WBC 8.9 11/06/2022   RBC 2.90 (L) 11/06/2022   HGB 9.0 (L) 11/06/2022   HCT 27.9 (L) 11/06/2022   MCV 96.2 11/06/2022   MCH 31.0 11/06/2022   PLT 132 (L) 11/06/2022   MCHC 32.3 11/06/2022   RDW 12.6 11/06/2022   LYMPHSABS 1.6 11/03/2022   MONOABS 0.5 11/03/2022   EOSABS 0.0 11/03/2022   BASOSABS 0.1 45/80/9983     Last metabolic panel Lab Results  Component Value Date   NA 139 11/06/2022   K 4.1 11/06/2022   CL 107 11/06/2022   CO2 25 11/06/2022   BUN 20 11/06/2022   CREATININE 0.76 11/06/2022   GLUCOSE 112 (H) 11/06/2022   GFRNONAA >60 11/06/2022   GFRAA 72 05/28/2020   CALCIUM 8.2 (L) 11/06/2022   PROT 8.4 (H) 06/22/2022   ALBUMIN 4.5 04/08/2022   BILITOT 0.5 06/22/2022   ALKPHOS 56 04/08/2022   AST 39 (H) 06/22/2022   ALT 57 (H) 06/22/2022   ANIONGAP 7 11/06/2022    GFR: Estimated Creatinine Clearance: 39.2 mL/min (by C-G formula based on SCr of 0.76 mg/dL).  Recent Results (from the past 240 hour(s))  Surgical pcr screen     Status: None  Collection Time: 11/04/22 11:51 PM   Specimen: Nasal Mucosa; Nasal Swab  Result Value Ref Range Status   MRSA, PCR NEGATIVE NEGATIVE Final   Staphylococcus aureus NEGATIVE NEGATIVE Final    Comment: (NOTE) The Xpert SA Assay (FDA approved for NASAL specimens in patients 44 years of age and older), is one component of a comprehensive surveillance program.  It is not intended to diagnose infection nor to guide or monitor treatment. Performed at [facility], [place] [address], [place], [place] [address]       Radiology Studies: Pelvis Portable  Result Date: 11/05/2022 CLINICAL DATA:  Right femoral neck fracture status post arthroplasty EXAM: PORTABLE PELVIS 1-2 VIEWS COMPARISON:  11/03/2022 FINDINGS: Single frontal view of the pelvis includes both hips. Right hip arthroplasty is identified in the expected position without evidence of acute complication. Postsurgical changes are seen within the soft tissues overlying the right hip. Stable mild left hip osteoarthritis. No acute fractures. IMPRESSION: 1. Unremarkable right hip arthroplasty. Electronically Signed   By: Randa Ngo M.D.   On: 11/05/2022 16:14   DG HIP UNILAT WITH PELVIS 1V RIGHT  Result Date: 11/05/2022 CLINICAL DATA:  Status post right hip arthroplasty. EXAM: DG HIP (WITH OR WITHOUT PELVIS) 1V RIGHT COMPARISON:  11/03/2022 FINDINGS: Two images obtained via portable C-arm radiography in the operating room were submitted. There is been interval right total hip arthroplasty. Hardware components are in anatomic alignment. Surgical drainage catheter and surgical sponge are identified in lateral to right hip. IMPRESSION: Status post right total hip arthroplasty. Surgical drainage catheter and sponge noted lateral to the right hip. Electronically Signed   By: Kerby Moors M.D.   On: 11/05/2022 09:33   DG C-Arm 1-60 Min-No Report  Result Date: 11/05/2022 Fluoroscopy was utilized by the requesting physician.  No radiographic interpretation.   DG C-Arm 1-60 Min-No Report  Result Date: 11/05/2022 Fluoroscopy was utilized by the requesting physician.  No radiographic interpretation.      LOS: 2 days    Cordelia Poche, MD [facility] 11/06/2022, 12:35 PM   If 7PM-7AM, please contact night-coverage [email or web address]

## 2022-11-06 NOTE — Progress Notes (Signed)
Inpatient Rehab Admissions Coordinator:  ? ?Per therapy recommendations,  patient was screened for CIR candidacy by Shelly Shoultz, MS, CCC-SLP. At this time, Pt. Appears to be a a potential candidate for CIR. I will place   order for rehab consult per protocol for full assessment. Please contact me any with questions. ? ?Adir Schicker, MS, CCC-SLP ?Rehab Admissions Coordinator  ?[phone] (celll) ?[phone] (office) ? ?

## 2022-11-07 DIAGNOSIS — F419 Anxiety disorder, unspecified: Secondary | ICD-10-CM | POA: Diagnosis not present

## 2022-11-07 DIAGNOSIS — I10 Essential (primary) hypertension: Secondary | ICD-10-CM | POA: Diagnosis not present

## 2022-11-07 DIAGNOSIS — S72001A Fracture of unspecified part of neck of right femur, initial encounter for closed fracture: Secondary | ICD-10-CM | POA: Diagnosis not present

## 2022-11-07 DIAGNOSIS — S82891A Other fracture of right lower leg, initial encounter for closed fracture: Secondary | ICD-10-CM | POA: Diagnosis not present

## 2022-11-07 LAB — CBC
HCT: 25.4 % — ABNORMAL LOW (ref 36.0–46.0)
Hemoglobin: 8.1 g/dL — ABNORMAL LOW (ref 12.0–15.0)
MCH: 31.3 pg (ref 26.0–34.0)
MCHC: 31.9 g/dL (ref 30.0–36.0)
MCV: 98.1 fL (ref 80.0–100.0)
Platelets: 127 10*3/uL — ABNORMAL LOW (ref 150–400)
RBC: 2.59 MIL/uL — ABNORMAL LOW (ref 3.87–5.11)
RDW: 13 % (ref 11.5–15.5)
WBC: 9.4 10*3/uL (ref 4.0–10.5)
nRBC: 0 % (ref 0.0–0.2)

## 2022-11-07 MED ORDER — TRAMADOL HCL 50 MG PO TABS: 50.0000 mg | ORAL_TABLET | Freq: Four times a day (QID) | ORAL | 0 refills | Status: DC | PRN

## 2022-11-07 MED ORDER — HYDROCODONE-ACETAMINOPHEN 5-325 MG PO TABS: 1.0000 | ORAL_TABLET | Freq: Four times a day (QID) | ORAL | 0 refills | Status: DC | PRN

## 2022-11-07 MED ORDER — METHOCARBAMOL 500 MG PO TABS: 500.0000 mg | ORAL_TABLET | Freq: Four times a day (QID) | ORAL | 0 refills | Status: DC | PRN

## 2022-11-07 MED ORDER — ASPIRIN 325 MG PO TBEC: 325.0000 mg | DELAYED_RELEASE_TABLET | Freq: Two times a day (BID) | ORAL | 0 refills | Status: DC

## 2022-11-07 NOTE — Progress Notes (Signed)
PROGRESS NOTE    Dominique Livingston  ZOX:[id number] DOB: 07-Apr-1933 DOA: 11/03/2022 PCP: Elby Showers, MD   Brief Narrative: Dominique Livingston is a 89 y.o. female with a history of GERD, hyperthyroidism, hypertension, diverticulosis, IBS, hyperlipidemia,. Patient presented secondary to right leg pain after fall and found to have a mildly displaced right femoral neck fracture. Orthopedic surgery was consulted and performed a successful right total hip arthroplasty on 2/4. PT/OT consulted and are recommending acute inpatient rehabilitation   Assessment and Plan:  Right hip fracture Mildly displaced, secondary to fall. Orthopedic surgery consulted and performed a successful right total hip arthroplasty on 2/4. PT recommending Inpatient Rehab -Orthopedic surgery recommendations: PT/OT  Right ankle avulsion fracture Right ankle sprain -Orthopedic surgery recommendations: PT/OT  Acute anemia Postoperative anemia Secondary to hip fracture. Baseline hemoglobin of 13. Hemoglobin of 11.1 on admission. Patient with a 2.8 g/dL drop in hemoglobin after surgery, likely related to acute blood loss anemia pre-/perioperatively. No bruising noted over incision site. -CBC in AM  Thrombocytopenia Mild. Likely secondary to blood loss. -Trend CBC  Primary hypertension -Continue lisinopril  Hypothyroidism -Continue Synthroid  Anxiety -Continue Xanax prn   DVT prophylaxis: Per orthopedic surgery (Aspirin 325 mg BID) Code Status:   Code Status: Full Code Family Communication: None at bedside Disposition Plan: Discharge inpatient rehab vs SNF likely in 1 days pending orthopedic surgery recommendations and stable hemoglobin   Consultants:  Orthopedic surgery  Procedures:  2/4: Right total hip arthroplasty  Antimicrobials: None    Subjective: Patient reports some right hip pain but this is stable. No other issues noted.  Objective: BP (!) 136/52 (BP Location: Left Arm)   Pulse 78    Temp 98.6 F (37 C) (Oral)   Resp 18   Ht 5\' 2"  (1.575 m)   Wt 59 kg   SpO2 98%   BMI 23.79 kg/m   Examination:  General exam: Appears calm and comfortable Respiratory system: Clear to auscultation. Respiratory effort normal. Cardiovascular system: S1 & S2 heard, RRR. Gastrointestinal system: Abdomen is nondistended, soft and nontender. No organomegaly or masses felt. Normal bowel sounds heard. Central nervous system: Alert and oriented. No focal neurological deficits. Musculoskeletal: No calf tenderness. Mild edema over incision site with associated tenderness Skin: Right hip incision site with no ecchymosis noted Psychiatry: Judgement and insight appear normal. Mood & affect appropriate.    Data Reviewed: I have personally reviewed following labs and imaging studies  CBC Lab Results  Component Value Date   WBC 9.4 11/07/2022   RBC 2.59 (L) 11/07/2022   HGB 8.1 (L) 11/07/2022   HCT 25.4 (L) 11/07/2022   MCV 98.1 11/07/2022   MCH 31.3 11/07/2022   PLT 127 (L) 11/07/2022   MCHC 31.9 11/07/2022   RDW 13.0 11/07/2022   LYMPHSABS 1.6 11/03/2022   MONOABS 0.5 11/03/2022   EOSABS 0.0 11/03/2022   BASOSABS 0.1 81/19/1478     Last metabolic panel Lab Results  Component Value Date   NA 139 11/06/2022   K 4.1 11/06/2022   CL 107 11/06/2022   CO2 25 11/06/2022   BUN 20 11/06/2022   CREATININE 0.76 11/06/2022   GLUCOSE 112 (H) 11/06/2022   GFRNONAA >60 11/06/2022   GFRAA 72 05/28/2020   CALCIUM 8.2 (L) 11/06/2022   PROT 8.4 (H) 06/22/2022   ALBUMIN 4.5 04/08/2022   BILITOT 0.5 06/22/2022   ALKPHOS 56 04/08/2022   AST 39 (H) 06/22/2022   ALT 57 (H) 06/22/2022   ANIONGAP 7  11/06/2022    GFR: Estimated Creatinine Clearance: 39.2 mL/min (by C-G formula based on SCr of 0.76 mg/dL).  Recent Results (from the past 240 hour(s))  Surgical pcr screen     Status: None   Collection Time: 11/04/22 11:51 PM   Specimen: Nasal Mucosa; Nasal Swab  Result Value Ref Range  Status   MRSA, PCR NEGATIVE NEGATIVE Final   Staphylococcus aureus NEGATIVE NEGATIVE Final    Comment: (NOTE) The Xpert SA Assay (FDA approved for NASAL specimens in patients 63 years of age and older), is one component of a comprehensive surveillance program. It is not intended to diagnose infection nor to guide or monitor treatment. Performed at [facility], [place] [address] [place], [place] [address]       Radiology Studies: Pelvis Portable  Result Date: 11/05/2022 CLINICAL DATA:  Right femoral neck fracture status post arthroplasty EXAM: PORTABLE PELVIS 1-2 VIEWS COMPARISON:  11/03/2022 FINDINGS: Single frontal view of the pelvis includes both hips. Right hip arthroplasty is identified in the expected position without evidence of acute complication. Postsurgical changes are seen within the soft tissues overlying the right hip. Stable mild left hip osteoarthritis. No acute fractures. IMPRESSION: 1. Unremarkable right hip arthroplasty. Electronically Signed   By: Randa Ngo M.D.   On: 11/05/2022 16:14   DG HIP UNILAT WITH PELVIS 1V RIGHT  Result Date: 11/05/2022 CLINICAL DATA:  Status post right hip arthroplasty. EXAM: DG HIP (WITH OR WITHOUT PELVIS) 1V RIGHT COMPARISON:  11/03/2022 FINDINGS: Two images obtained via portable C-arm radiography in the operating room were submitted. There is been interval right total hip arthroplasty. Hardware components are in anatomic alignment. Surgical drainage catheter and surgical sponge are identified in lateral to right hip. IMPRESSION: Status post right total hip arthroplasty. Surgical drainage catheter and sponge noted lateral to the right hip. Electronically Signed   By: Kerby Moors M.D.   On: 11/05/2022 09:33   DG C-Arm 1-60 Min-No Report  Result Date: 11/05/2022 Fluoroscopy was utilized by the requesting physician.  No radiographic interpretation.   DG C-Arm 1-60 Min-No Report  Result Date: 11/05/2022 Fluoroscopy was  utilized by the requesting physician.  No radiographic interpretation.      LOS: 3 days    Cordelia Poche, MD [facility] 11/07/2022, 8:21 AM   If 7PM-7AM, please contact night-coverage [email or web address]

## 2022-11-07 NOTE — Progress Notes (Signed)
   Subjective: 2 Days Post-Op Procedure(s) (LRB): TOTAL HIP ARTHROPLASTY ANTERIOR APPROACH (Right) Patient seen in rounds by Dr. Wynelle Link. Patient is well, and has had no acute complaints or problems. Denies SOB or chest pain. Denies calf pain. Patient reports pain as mild. Started physical and occupational therapy yesterday. Ambulated about 60'. Plan is to go Skilled nursing facility after hospital stay.  Objective: Vital signs in last 24 hours: Temp:  [98.4 F (36.9 C)-98.8 F (37.1 C)] 98.6 F (37 C) (02/06 0646) Pulse Rate:  [76-82] 78 (02/06 0646) Resp:  [16-18] 18 (02/06 0646) BP: (129-146)/(44-65) 136/52 (02/06 0646) SpO2:  [95 %-98 %] 98 % (02/06 0646)  Intake/Output from previous day:  Intake/Output Summary (Last 24 hours) at 11/07/2022 0745 Last data filed at 11/07/2022 0600 Gross per 24 hour  Intake 1348.81 ml  Output 550 ml  Net 798.81 ml    Intake/Output this shift: No intake/output data recorded.  Labs: Recent Labs    11/06/22 0315 11/07/22 0317  HGB 9.0* 8.1*   Recent Labs    11/06/22 0315 11/07/22 0317  WBC 8.9 9.4  RBC 2.90* 2.59*  HCT 27.9* 25.4*  PLT 132* 127*   Recent Labs    11/06/22 0315  NA 139  K 4.1  CL 107  CO2 25  BUN 20  CREATININE 0.76  GLUCOSE 112*  CALCIUM 8.2*   No results for input(s): "LABPT", "INR" in the last 72 hours.  Exam: General - Patient is Alert and Oriented Extremity - Neurologically intact Neurovascular intact Sensation intact distally Dorsiflexion/Plantar flexion intact Dressing/Incision - clean, dry, no drainage Motor Function - intact, moving foot and toes well on exam.  Past Medical History:  Diagnosis Date   Allergy    Anxiety    Arthritis    hands , stiffness in knees & hips    Colon polyp, hyperplastic    Diverticulosis    Endometriosis    Fibrocystic breast disease    GERD (gastroesophageal reflux disease)    Hyperlipidemia    Hypertension    Hypothyroidism    IBS (irritable bowel  syndrome)    Polymyalgia rheumatica (HCC)     Assessment/Plan: 2 Days Post-Op Procedure(s) (LRB): TOTAL HIP ARTHROPLASTY ANTERIOR APPROACH (Right) Principal Problem:   Hip fracture (HCC) Active Problems:   Essential hypertension   Hypothyroidism  Estimated body mass index is 23.79 kg/m as calculated from the following:   Height as of this encounter: 5\' 2"  (1.575 m).   Weight as of this encounter: 59 kg. Advance diet Up with therapy D/C IV fluids  DVT Prophylaxis - Aspirin Weight-bearing as tolerated.  Continue PT and OT while inpatient. Plan to go SNF once discharged. Will go ahead and print scripts in preparation.  The PDMP database was reviewed today prior to any opioid medications being prescribed to this patient.  R. Jaynie Bream, PA-C Orthopedic Surgery [phone] 11/07/2022, 7:45 AM

## 2022-11-07 NOTE — Plan of Care (Signed)
  Problem: Education: Goal: Knowledge of General Education information will improve Description: Including pain rating scale, medication(s)/side effects and non-pharmacologic comfort measures Outcome: Progressing   Problem: Health Behavior/Discharge Planning: Goal: Ability to manage health-related needs will improve Outcome: Progressing   Problem: Clinical Measurements: Goal: Ability to maintain clinical measurements within normal limits will improve Outcome: Progressing Goal: Will remain free from infection Outcome: Progressing Goal: Diagnostic test results will improve Outcome: Progressing Goal: Respiratory complications will improve Outcome: Progressing Goal: Cardiovascular complication will be avoided Outcome: Progressing   Problem: Activity: Goal: Risk for activity intolerance will decrease Outcome: Progressing   Problem: Nutrition: Goal: Adequate nutrition will be maintained Outcome: Progressing   Problem: Coping: Goal: Level of anxiety will decrease Outcome: Progressing   Problem: Elimination: Goal: Will not experience complications related to bowel motility Outcome: Progressing Goal: Will not experience complications related to urinary retention Outcome: Progressing   Problem: Pain Managment: Goal: General experience of comfort will improve Outcome: Progressing   Problem: Safety: Goal: Ability to remain free from injury will improve Outcome: Progressing   Problem: Skin Integrity: Goal: Risk for impaired skin integrity will decrease Outcome: Progressing   Problem: Education: Goal: Verbalization of understanding the information provided (i.e., activity precautions, restrictions, etc) will improve Outcome: Progressing Goal: Individualized Educational Video(s) Outcome: Progressing   Problem: Activity: Goal: Ability to ambulate and perform ADLs will improve Outcome: Progressing   Problem: Clinical Measurements: Goal: Postoperative complications will be  avoided or minimized Outcome: Progressing   Problem: Self-Concept: Goal: Ability to maintain and perform role responsibilities to the fullest extent possible will improve Outcome: Progressing   Problem: Pain Management: Goal: Pain level will decrease Outcome: Progressing   Problem: Education: Goal: Knowledge of the prescribed therapeutic regimen will improve Outcome: Progressing Goal: Understanding of discharge needs will improve Outcome: Progressing Goal: Individualized Educational Video(s) Outcome: Progressing   Problem: Activity: Goal: Ability to avoid complications of mobility impairment will improve Outcome: Progressing Goal: Ability to tolerate increased activity will improve Outcome: Progressing   Problem: Clinical Measurements: Goal: Postoperative complications will be avoided or minimized Outcome: Progressing   Problem: Pain Management: Goal: Pain level will decrease with appropriate interventions Outcome: Progressing   Problem: Skin Integrity: Goal: Will show signs of wound healing Outcome: Progressing   

## 2022-11-07 NOTE — Progress Notes (Signed)
  Inpatient Rehabilitation Admissions Coordinator   I spoke with patient as well as her daughter, Santiago Glad at patient's bedside by phone for rehab assessment. We discussed goals and expectations of a possible CIR admit. They prefer CIR for rehab. Daughter, Santiago Glad, son and niece can provide expected caregiver support that is recommended of initial 24/7 supervision. I feel she is a great candidate for CIR. I am hopeful for a bed in the next 48 hrs. Please call me with any questions.   Danne Baxter, RN, MSN Rehab Admissions Coordinator [phone]

## 2022-11-07 NOTE — Progress Notes (Addendum)
Physical Therapy Treatment Patient Details Name: Dominique Livingston MRN: [id number] DOB: 1932-10-24 Today's Date: 11/07/2022   History of Present Illness Patient is a 89 y.o. female who presented secondary to right leg pain after fall and found to have a mildly displaced right femoral neck fracture and R ankle sprain/avulsion fx. Orthopedic surgery was consulted and pt is now s/p R AA right total hip arthroplasty on 2/4  PMH: of GERD, hyperthyroidism, hypertension, diverticulosis, IBS, hyperlipidemia,.    PT Comments    Pt is progressing toward PT goals; incr gait distance this session, tolerating LE exercise after amb. Pt expresses concern regarding tolerating 3 hrs of therapy per day, discussed with pt and dtr that this would be over the course of the day with rest between, would include ADLs etc as part of her therapy. Pt verbalizes understanding and remains motivated to return to independence.  Recommend AIR level therapies so pt may meet her goals and return to independence; pt has good family support as well.   Recommendations for follow up therapy are one component of a multi-disciplinary discharge planning process, led by the attending physician.  Recommendations may be updated based on patient status, additional functional criteria and insurance authorization.  Follow Up Recommendations  Acute inpatient rehab (3hours/day)     Assistance Recommended at Discharge Intermittent Supervision/Assistance  Patient can return home with the following A little help with walking and/or transfers;A little help with bathing/dressing/bathroom;Assist for transportation;Help with stairs or ramp for entrance;Assistance with cooking/housework   Equipment Recommendations  Other (comment) (defer to next venue)    Recommendations for Other Services       Precautions / Restrictions Precautions Precautions: Fall Other Brace: R ankle ASO Restrictions Weight Bearing Restrictions: No LLE Weight Bearing:  Weight bearing as tolerated     Mobility  Bed Mobility               General bed mobility comments: in bathroom on arrival    Transfers Overall transfer level: Needs assistance Equipment used: Rolling walker (2 wheels) Transfers: Sit to/from Stand Sit to Stand: Min guard           General transfer comment: verbal cues for hand placement and RLE Position, demonstrates controlled descent to recliner    Ambulation/Gait Ambulation/Gait assistance: Min guard Gait Distance (Feet): 100 Feet Assistive device: Rolling walker (2 wheels) Gait Pattern/deviations: Step-to pattern, Step-through pattern, Decreased weight shift to right       General Gait Details: verbal cues for gait progression, improvign wt shift to right with incr step/stride length   Stairs             Wheelchair Mobility    Modified Rankin (Stroke Patients Only)       Balance     Sitting balance-Leahy Scale: Good     Standing balance support: Single extremity supported, No upper extremity supported, During functional activity Standing balance-Leahy Scale: Fair (pt is able to stand and wash hands without UE support)                              Cognition Arousal/Alertness: Awake/alert Behavior During Therapy: WFL for tasks assessed/performed Overall Cognitive Status: Within Functional Limits for tasks assessed                                 General Comments: patient is plesant and cooperative. daughter was present  as well.        Exercises  Ankle pumps L x10 Quad sets bil x 10 Heel slides R x10 Hip abd/add R x10    General Comments        Pertinent Vitals/Pain Pain Assessment Pain Assessment: Faces Faces Pain Scale: Hurts a little bit Pain Location: right hip and ankle Pain Descriptors / Indicators: Discomfort, Sore Pain Intervention(s): Limited activity within patient's tolerance, Monitored during session, Repositioned, Premedicated before  session, Ice applied    Home Living                          Prior Function            PT Goals (current goals can now be found in the care plan section) Acute Rehab PT Goals Patient Stated Goal: be as independent as possible PT Goal Formulation: With patient Time For Goal Achievement: 11/20/22 Potential to Achieve Goals: Good Progress towards PT goals: Progressing toward goals    Frequency    Min 4X/week      PT Plan Current plan remains appropriate    Co-evaluation              AM-PAC PT "6 Clicks" Mobility   Outcome Measure  Help needed turning from your back to your side while in a flat bed without using bedrails?: A Little Help needed moving from lying on your back to sitting on the side of a flat bed without using bedrails?: A Little Help needed moving to and from a bed to a chair (including a wheelchair)?: A Little Help needed standing up from a chair using your arms (e.g., wheelchair or bedside chair)?: A Little Help needed to walk in hospital room?: A Little Help needed climbing 3-5 steps with a railing? : A Little 6 Click Score: 18    End of Session Equipment Utilized During Treatment: Gait belt Activity Tolerance: Patient tolerated treatment well Patient left: in chair;with call bell/phone within reach;with chair alarm set;with family/visitor present Nurse Communication: Mobility status PT Visit Diagnosis: Other abnormalities of gait and mobility (R26.89);Difficulty in walking, not elsewhere classified (R26.2)     Time: 0263-7858 PT Time Calculation (min) (ACUTE ONLY): 27 min  Charges:  $Gait Training: 8-22 mins $Therapeutic Exercise: 8-22 mins                     Baxter Flattery, PT  Acute Rehab Dept [facility]) [phone]  [facility] Weekend Pager [facility] only)  [phone]  11/07/2022    [facility] 11/07/2022, 3:35 PM

## 2022-11-07 NOTE — PMR Pre-admission (Signed)
PMR Admission Coordinator Pre-Admission Assessment  Patient: Dominique Livingston is an 89 y.o., female MRN: [id number] DOB: [birth year] Height: 5\' 2"  (157.5 cm) Weight: 59 kg  Insurance Information HMO:     PPO:      PCP:      IPA:      80/20:      OTHER:  PRIMARY: Medicare a and b      Policy#: [id number]      Subscriber: pt Benefits:  Phone #: passport one source     Name: 2/6 Eff. Date: 01/31/2000     Deduct: $1632      Out of Pocket Max: none      Life Max: none CIR: 100%      SNF: 20 full days Outpatient: 80%     Co-Pay: 20% Home Health: 100%      Co-Pay: none DME: 80%     Co-Pay: 20% Providers: pt choice  SECONDARY: BCBS [place] supplement      Policy#: [id number]  Financial Counselor:       Phone#:   The "Data Collection Information Summary" for patients in Inpatient Rehabilitation Facilities with attached "Privacy Act Statement-Health Care Records" was provided and verbally reviewed with: Patient and Family  Emergency Contact Information Contact Information     Name Relation Home Work Mobile   [place] S Daughter [phone]  [phone]      Current Medical History  Patient Admitting Diagnosis: Hip fx  History of Present Illness:   89 year old right-handed female with history of hypertension, endometriosis status post remote right hemicolectomy, IBS, hyperlipidemia, polymyalgia rheumatica.   Presented 11/03/2022 after mechanical fall at home after she tripped on the carpet.  Denied loss of consciousness.  Patient notes she was on the floor x 1 hour and was unable to reach the phone.    Admission chemistries unremarkable except glucose 144, WBC 11,400, hemoglobin 11.1.  X-rays and imaging revealed right femoral neck fracture.  Underwent right total hip arthroplasty, anterior approach 11/05/2022 per Dr. 01/04/2023.  Patient is weightbearing as tolerated.  Patient also sustained right ankle avulsion fracture conservative care with ASO brace applied.  Hospital course acute blood loss  anemia 8.3 and monitored.  Placed on aspirin 325 mg twice daily for DVT prophylaxis   Patient's medical record from [facility] has been reviewed by the rehabilitation admission coordinator and physician.  Past Medical History  Past Medical History:  Diagnosis Date   Allergy    Anxiety    Arthritis    hands , stiffness in knees & hips    Colon polyp, hyperplastic    Diverticulosis    Endometriosis    Fibrocystic breast disease    GERD (gastroesophageal reflux disease)    Hyperlipidemia    Hypertension    Hypothyroidism    IBS (irritable bowel syndrome)    Polymyalgia rheumatica (HCC)    Has the patient had major surgery during 100 days prior to admission? Yes  Family History   family history includes COPD in her father; Cancer in her mother; Heart attack in her son; Heart disease in her father and son; Hypertension in her daughter and mother.  Current Medications  Current Facility-Administered Medications:    acetaminophen (TYLENOL) tablet 325-650 mg, 325-650 mg, Oral, Q6H PRN, [facility], PA, 650 mg at 11/09/22 01/08/23   ALPRAZolam 8315) tablet 0.25 mg, 0.25 mg, Oral, BID PRN, Prudy Feeler, PA, 0.25 mg at 11/04/22 2114   aspirin EC tablet 325 mg, 325 mg, Oral, BID, 2115,  PA, 325 mg at 11/08/22 2121   bisacodyl (DULCOLAX) suppository 10 mg, 10 mg, Rectal, Daily PRN, Eartha Inch, PA   diphenhydrAMINE (BENADRYL) 12.5 MG/5ML elixir 12.5-25 mg, 12.5-25 mg, Oral, Q4H PRN, Eartha Inch, PA   docusate sodium (COLACE) capsule 100 mg, 100 mg, Oral, BID, Eartha Inch, PA, 100 mg at 11/08/22 2120   feeding supplement (ENSURE ENLIVE / ENSURE PLUS) liquid 237 mL, 237 mL, Oral, BID BM, Narda Bonds, MD, 237 mL at 11/06/22 1514   HYDROcodone-acetaminophen (NORCO/VICODIN) 5-325 MG per tablet 1-2 tablet, 1-2 tablet, Oral, Q4H PRN, Eartha Inch, PA   HYDROcodone-acetaminophen (NORCO/VICODIN) 5-325 MG per tablet 1-2 tablet, 1-2 tablet, Oral,  Q4H PRN, Ollen Gross, MD, 1 tablet at 11/05/22 1949   levothyroxine (SYNTHROID) tablet 75 mcg, 75 mcg, Oral, Daily, Eartha Inch, PA, 75 mcg at 11/09/22 7342   menthol-cetylpyridinium (CEPACOL) lozenge 3 mg, 1 lozenge, Oral, PRN **OR** phenol (CHLORASEPTIC) mouth spray 1 spray, 1 spray, Mouth/Throat, PRN, Eartha Inch, PA   [DISCONTINUED] methocarbamol (ROBAXIN) tablet 500 mg, 500 mg, Oral, Q6H PRN **OR** methocarbamol (ROBAXIN) 500 mg in dextrose 5 % 50 mL IVPB, 500 mg, Intravenous, Q6H PRN, Eartha Inch, PA, Last Rate: 110 mL/hr at 11/05/22 1820, 500 mg at 11/05/22 1820   methocarbamol (ROBAXIN) tablet 500 mg, 500 mg, Oral, Q6H PRN, 500 mg at 11/07/22 0855 **OR** methocarbamol (ROBAXIN) 500 mg in dextrose 5 % 50 mL IVPB, 500 mg, Intravenous, Q6H PRN, Aluisio, Homero Fellers, MD   metoCLOPramide (REGLAN) tablet 5-10 mg, 5-10 mg, Oral, Q8H PRN **OR** metoCLOPramide (REGLAN) injection 5-10 mg, 5-10 mg, Intravenous, Q8H PRN, Eartha Inch, PA   [DISCONTINUED] metoCLOPramide (REGLAN) tablet 5-10 mg, 5-10 mg, Oral, Q8H PRN **OR** metoCLOPramide (REGLAN) injection 5-10 mg, 5-10 mg, Intravenous, Q8H PRN, Aluisio, Homero Fellers, MD   morphine (PF) 2 MG/ML injection 0.5-1 mg, 0.5-1 mg, Intravenous, Q2H PRN, Eartha Inch, PA   morphine (PF) 2 MG/ML injection 1 mg, 1 mg, Intravenous, Q2H PRN, Aluisio, Homero Fellers, MD   multivitamin with minerals tablet 1 tablet, 1 tablet, Oral, Daily, Eartha Inch, PA, 1 tablet at 11/08/22 0838   ondansetron (ZOFRAN) tablet 4 mg, 4 mg, Oral, Q6H PRN **OR** ondansetron (ZOFRAN) injection 4 mg, 4 mg, Intravenous, Q6H PRN, [place], Rebecca, PA   polyethylene glycol (MIRALAX / GLYCOLAX) packet 17 g, 17 g, Oral, Daily PRN, Eartha Inch, PA, 17 g at 11/08/22 0839   polyvinyl alcohol (LIQUIFILM TEARS) 1.4 % ophthalmic solution 1 drop, 1 drop, Both Eyes, BID, [place], Rebecca, PA, 1 drop at 11/08/22 2121   psyllium (HYDROCIL/METAMUCIL) 1 packet, 1 packet, Oral, BID,  Eartha Inch, PA, 1 packet at 11/08/22 2119   sodium phosphate (FLEET) 7-19 GM/118ML enema 1 enema, 1 enema, Rectal, Once PRN, Eartha Inch, PA   traMADol (ULTRAM) tablet 50-100 mg, 50-100 mg, Oral, Q6H PRN, Eartha Inch, PA, 100 mg at 11/07/22 0856  Patients Current Diet:  Diet Order             Diet regular Room service appropriate? Yes; Fluid consistency: Thin  Diet effective now                   Precautions / Restrictions Precautions Precautions: Fall Other Brace: R ankle ASO Restrictions Weight Bearing Restrictions: No LLE Weight Bearing: Weight bearing as tolerated Other Position/Activity Restrictions: WBAT   Has the patient had 2 or more falls or a fall with injury in the past year? Yes  Prior Activity Level Community (5-7x/wk): Independent without AD,  driving and active  Prior Functional Level Self Care: Did the patient need help bathing, dressing, using the toilet or eating? Independent  Indoor Mobility: Did the patient need assistance with walking from room to room (with or without device)? Independent  Stairs: Did the patient need assistance with internal or external stairs (with or without device)? Independent  Functional Cognition: Did the patient need help planning regular tasks such as shopping or remembering to take medications? Independent  Patient Information Are you of Hispanic, Latino/a,or Spanish origin?: A. No, not of Hispanic, Latino/a, or Spanish origin What is your race?: A. White Do you need or want an interpreter to communicate with a doctor or health care staff?: 0. No  Patient's Response To:  Health Literacy and Transportation Is the patient able to respond to health literacy and transportation needs?: Yes Health Literacy - How often do you need to have someone help you when you read instructions, pamphlets, or other written material from your doctor or pharmacy?: Never In the past 12 months, has lack of transportation  kept you from medical appointments or from getting medications?: No In the past 12 months, has lack of transportation kept you from meetings, work, or from getting things needed for daily living?: No  Development worker, international aid / [place] Devices/Equipment: None Home Equipment: None  Prior Device Use: Indicate devices/aids used by the patient prior to current illness, exacerbation or injury? None of the above  Current Functional Level Cognition  Overall Cognitive Status: Within Functional Limits for tasks assessed Orientation Level: Oriented X4 General Comments: pt c/o "raw" feeling top of R ankle; RN had placed mepilex at proximal border of R ankle ASO. pt also reporting she believes the grips on the slipper socks may be aggravating her latex allergy; socks and ASL removed, elevated R ankle +ice; some edema and incr area of brusing noted over lateral ankle and dorsum R foot    Extremity Assessment (includes Sensation/Coordination)  Upper Extremity Assessment: Overall WFL for tasks assessed  Lower Extremity Assessment: Defer to PT evaluation RLE Deficits / Details: AAROM WFL, knee and hip 2+/5; ankle AROM grossly WFL per obs, NT further    ADLs  Overall ADL's : Needs assistance/impaired Eating/Feeding: Set up, Sitting Grooming: Wash/dry face, Wash/dry hands, Standing, Min guard Grooming Details (indicate cue type and reason): at sink with RW Upper Body Bathing: Min guard, Sitting Lower Body Bathing: Maximal assistance, Sit to/from stand, Sitting/lateral leans Upper Body Dressing : Min guard, Sitting Lower Body Dressing: Sitting/lateral leans, Sit to/from stand, Maximal assistance Lower Body Dressing Details (indicate cue type and reason): simulated Toilet Transfer: Minimal assistance, Ambulation, Rolling walker (2 wheels) Toilet Transfer Details (indicate cue type and reason): to transfer from bed to commode in bathroom and back with cues for proper sequencing of  task. Toileting- Clothing Manipulation and Hygiene: Minimal assistance, Sit to/from stand    Mobility  Overal bed mobility: Needs Assistance Bed Mobility: Supine to Sit, Sit to Supine Supine to sit: Supervision Sit to supine: Supervision General bed mobility comments: incr time, cues to complete progression of RLE off and on bed without physical assist    Transfers  Overall transfer level: Needs assistance Equipment used: Rolling walker (2 wheels) Transfers: Sit to/from Stand Sit to Stand: Min guard General transfer comment: verbal cues for hand placement and RLE Position, demonstrates good stability on ascent and descent    Ambulation / Gait / Stairs / Emergency planning/management officer  Ambulation/Gait Ambulation/Gait assistance: Supervision, Min guard Gait Distance (Feet): 80 Feet Assistive device:  Rolling walker (2 wheels) Gait Pattern/deviations: Step-to pattern, Step-through pattern, Decreased weight shift to right General Gait Details: verbal cues for RW position and safety with turns Gait velocity: decr    Posture / Balance Balance Overall balance assessment: Needs assistance Sitting-balance support: Feet supported, No upper extremity supported Sitting balance-Leahy Scale: Good Standing balance support: Reliant on assistive device for balance, During functional activity Standing balance-Leahy Scale: Fair    Special needs/care consideration Fall precautions   Previous Home Environment  Living Arrangements: Alone  Lives With: Alone Available Help at Discharge: Family, Available 24 hours/day (dtr, son and niece can provide initial 24/7 supervision) Type of Home: House Home Layout: One level Home Access: Stairs to enter Entrance Stairs-Rails: None Entrance Stairs-Number of Steps: 2 to 3 Bathroom Shower/Tub: Optometrist: Yes How Accessible: Accessible via walker [place]: No  Discharge Living Setting Plans for  Discharge Living Setting: Patient's home, Alone, House Type of Home at Discharge: House Discharge Home Layout: One level Discharge Home Access: Stairs to enter Entrance Stairs-Rails: None Entrance Stairs-Number of Steps: 2 to 3 Discharge Bathroom Shower/Tub: Tub/shower unit Discharge Bathroom Toilet: Standard Discharge Bathroom Accessibility: Yes How Accessible: Accessible via walker Does the patient have any problems obtaining your medications?: No  Social/Family/Support Systems Patient Roles: Parent Contact Information: daughter, Santiago Glad Anticipated Caregiver: daughter, son and niece Anticipated Caregiver's Contact Information: see contacts Ability/Limitations of Caregiver: daughter retired, son works, niece close by Building control surveyor Availability: 24/7 Discharge Plan Discussed with Primary Caregiver: Yes Is Caregiver In Agreement with Plan?: Yes Does Caregiver/Family have Issues with Lodging/Transportation while Pt is in Rehab?: No  Goals Patient/Family Goal for Rehab: supervision with PT and OT Expected length of stay: [place] 7 to 10 days Pt/Family Agrees to Admission and willing to participate: Yes Program Orientation Provided & Reviewed with Pt/Caregiver Including Roles  & Responsibilities: Yes  Decrease burden of Care through IP rehab admission: n/a  Possible need for SNF placement upon discharge: not anticipated  Patient Condition: I have reviewed medical records from [facility], spoke with patient and daughter. I discussed via phone for inpatient rehabilitation assessment.  Patient will benefit from ongoing PT and OT, can actively participate in 3 hours of therapy a day 5 days of the week, and can make measurable gains during the admission.  Patient will also benefit from the coordinated team approach during an Inpatient Acute Rehabilitation admission.  The patient will receive intensive therapy as well as Rehabilitation physician, nursing, social worker, and care management  interventions.  Due to bladder management, bowel management, safety, skin/wound care, disease management, medication administration, pain management, and patient education the patient requires 24 hour a day rehabilitation nursing.  The patient is currently min assist overall with mobility and basic ADLs.  Discharge setting and therapy post discharge at home with home health is anticipated.  Patient has agreed to participate in the Acute Inpatient Rehabilitation Program and will admit today.  Preadmission Screen Completed By:  Cleatrice Burke, 11/09/2022 10:07 AM ______________________________________________________________________   Discussed status with Dr. Dagoberto Ligas on 11/09/22 at 1006 and received approval for admission today.  Admission Coordinator:  Cleatrice Burke, RN, time 1006 Date 11/09/22   Assessment/Plan: Diagnosis: Does the need for close, 24 hr/day Medical supervision in concert with the patient's rehab needs make it unreasonable for this patient to be served in a less intensive setting? Yes Co-Morbidities requiring supervision/potential complications: Polymyalgia rheumatica, R ankle avulsion fx and R femoral neck fx, WBAT Due  to bladder management, bowel management, safety, skin/wound care, disease management, medication administration, pain management, and patient education, does the patient require 24 hr/day rehab nursing? Yes Does the patient require coordinated care of a physician, rehab nurse, PT, OT, and SLP to address physical and functional deficits in the context of the above medical diagnosis(es)? Yes Addressing deficits in the following areas: balance, endurance, locomotion, strength, transferring, bowel/bladder control, bathing, dressing, feeding, grooming, and toileting Can the patient actively participate in an intensive therapy program of at least 3 hrs of therapy 5 days a week? Yes The potential for patient to make measurable gains while on inpatient rehab is  good Anticipated functional outcomes upon discharge from inpatient rehab: supervision PT, supervision OT, n/a SLP Estimated rehab length of stay to reach the above functional goals is: 7-10 days Anticipated discharge destination: Home 10. Overall Rehab/Functional Prognosis: good   MD Signature:

## 2022-11-08 DIAGNOSIS — D62 Acute posthemorrhagic anemia: Secondary | ICD-10-CM

## 2022-11-08 DIAGNOSIS — S72001D Fracture of unspecified part of neck of right femur, subsequent encounter for closed fracture with routine healing: Secondary | ICD-10-CM | POA: Diagnosis not present

## 2022-11-08 DIAGNOSIS — E039 Hypothyroidism, unspecified: Secondary | ICD-10-CM

## 2022-11-08 DIAGNOSIS — S72001A Fracture of unspecified part of neck of right femur, initial encounter for closed fracture: Secondary | ICD-10-CM | POA: Diagnosis not present

## 2022-11-08 DIAGNOSIS — I10 Essential (primary) hypertension: Secondary | ICD-10-CM | POA: Diagnosis not present

## 2022-11-08 LAB — CBC
HCT: 26.5 % — ABNORMAL LOW (ref 36.0–46.0)
Hemoglobin: 8.3 g/dL — ABNORMAL LOW (ref 12.0–15.0)
MCH: 30.9 pg (ref 26.0–34.0)
MCHC: 31.3 g/dL (ref 30.0–36.0)
MCV: 98.5 fL (ref 80.0–100.0)
Platelets: 156 10*3/uL (ref 150–400)
RBC: 2.69 MIL/uL — ABNORMAL LOW (ref 3.87–5.11)
RDW: 13.2 % (ref 11.5–15.5)
WBC: 9.4 10*3/uL (ref 4.0–10.5)
nRBC: 0 % (ref 0.0–0.2)

## 2022-11-08 NOTE — H&P (Signed)
Physical Medicine and Rehabilitation Admission H&P    Chief Complaint  Patient presents with   Hip Pain  : HPI: Dominique Livingston is a 89 year old right-handed female with history of hypertension, endometriosis status post remote right hemicolectomy, IBS, hyperlipidemia, polymyalgia rheumatica.  Per chart review patient lives alone.  Independent and active prior to admission.  Presented 11/03/2022 after mechanical fall at home after she tripped on the carpet.  Denied loss of consciousness.  Patient notes she was on the floor x 1 hour and was unable to reach the phone.  Admission chemistries unremarkable except glucose 144, WBC 11,400, hemoglobin 11.1.  X-rays and imaging revealed right femoral neck fracture.  Underwent right total hip arthroplasty, anterior approach 11/05/2022 per Dr. Maureen Ralphs.  Patient is weightbearing as tolerated.  Patient also sustained right ankle avulsion fracture conservative care with ASO brace applied.  Hospital course acute blood loss anemia 8.3 and monitored.  Presently on aspirin 325 mg twice daily for DVT prophylaxis.  Blood pressure soft maintained on low-dose lisinopril.  Therapy evaluations completed due to patient decreased functional mobility was admitted for a comprehensive rehab program.   Pt reports no hip pain at rest, but with movement has RLE pain 2-3/10.  Hasn't taken pain meds since overnight last night. Has general soreness/achiness, but not pain, per se.   Not sleeping well in hospital- interested in meds to help.   Battling constipation since 7/23- LBM -very small yesterday but only small BM Monday and prior to that, last Thursday x3- got cleaned out 1 week ago. No change in appetite, no nausea.   Peeing OK, however doesn't have good control. "Leaking some" when transferred to bed from stretcher- is new- no dysuria; no frequency, but thinks since cannot easily get OOB, it's slowing her down.   Has corset type brace/splint for R ankle- has rubbed her  raw on back of R ankle, so they are putting foam dressing under it as well.    Review of Systems  Constitutional:  Negative for chills and fever.  HENT:  Negative for hearing loss.   Eyes:  Negative for blurred vision and double vision.  Respiratory:  Negative for cough and shortness of breath.   Cardiovascular:  Positive for leg swelling. Negative for chest pain and palpitations.  Gastrointestinal:  Positive for constipation. Negative for heartburn, nausea and vomiting.       GERD  Genitourinary:  Negative for dysuria, flank pain and hematuria.  Musculoskeletal:  Positive for joint pain and myalgias.  Skin:  Negative for rash.  Psychiatric/Behavioral:         Anxiety  All other systems reviewed and are negative.  Past Medical History:  Diagnosis Date   Allergy    Anxiety    Arthritis    hands , stiffness in knees & hips    Colon polyp, hyperplastic    Diverticulosis    Endometriosis    Fibrocystic breast disease    GERD (gastroesophageal reflux disease)    Hyperlipidemia    Hypertension    Hypothyroidism    IBS (irritable bowel syndrome)    Polymyalgia rheumatica (HCC)    Past Surgical History:  Procedure Laterality Date   ABDOMINAL HYSTERECTOMY     APPENDECTOMY     BREAST BIOPSY Right    CATARACT EXTRACTION, BILATERAL Bilateral    cataracts   CHOLECYSTECTOMY N/A 05/09/2016   Procedure: LAPAROSCOPIC CHOLECYSTECTOMY WITH INTRAOPERATIVE CHOLANGIOGRAM;  Surgeon: Jackolyn Confer, MD;  Location: [facility];  Service: General;  Laterality:  N/A;   LEFT COLECTOMY     removed about 18 in due to endometriosos   TOTAL HIP ARTHROPLASTY Right 11/05/2022   Procedure: TOTAL HIP ARTHROPLASTY ANTERIOR APPROACH;  Surgeon: Ollen Gross, MD;  Location: [facility];  Service: Orthopedics;  Laterality: Right;   Family History  Problem Relation Age of Onset   Cancer Mother        type unknown   Hypertension Mother    Heart disease Father    COPD Father    Hypertension Daughter    Heart  disease Son    Heart attack Son    Breast cancer Neg Hx    Social History:  reports that she has never smoked. She has never used smokeless tobacco. She reports that she does not drink alcohol and does not use drugs. Allergies:  Allergies  Allergen Reactions   Amoxicillin-Pot Clavulanate Nausea Only   Erythromycin Nausea And Vomiting   Mushroom Extract Complex Other (See Comments)    Cantalope, adding enviromental allergens such as grass & trees Had allergy test   Latex Rash   Macrobid [Nitrofurantoin Monohyd Macro] Rash   Zetia [Ezetimibe] Rash   Medications Prior to Admission  Medication Sig Dispense Refill   ALPRAZolam (XANAX) 0.25 MG tablet Take 0.25 mg by mouth 2 (two) times daily as needed for anxiety.     levothyroxine (SYNTHROID) 75 MCG tablet Take 1 tablet by mouth once daily (Patient taking differently: Take 75 mcg by mouth daily before breakfast.) 90 tablet 0   lisinopril (ZESTRIL) 5 MG tablet Take 1 tablet by mouth once daily 90 tablet 3   Multiple Vitamin (MULTIVITAMIN) tablet Take 1 tablet by mouth daily.     Polyethyl Glycol-Propyl Glycol (SYSTANE ULTRA OP) Place 1 drop into both eyes 2 (two) times daily.     psyllium (METAMUCIL SMOOTH TEXTURE) 28 % packet Take 1 packet by mouth 2 (two) times daily.        Home: Home Living Family/patient expects to be discharged to:: Inpatient rehab Living Arrangements: Alone Available Help at Discharge: Family, Available 24 hours/day (dtr, son and niece can provide initial 24/7 supervision) Type of Home: House Home Access: Stairs to enter [employer] of Steps: 2 to 3 Entrance Stairs-Rails: None Home Layout: One level Bathroom Shower/Tub: Associate Professor: Yes Home Equipment: None  Lives With: Alone   Functional History: Prior Function Prior Level of Function : Independent/Modified Independent, Driving Mobility Comments: pt very active, independent at  baseline  Functional Status:  Mobility: Bed Mobility Overal bed mobility: Needs Assistance Bed Mobility: Supine to Sit, Sit to Supine Supine to sit: Supervision Sit to supine: Supervision General bed mobility comments: incr time, cues to complete progression of RLE off and on bed without physical assist Transfers Overall transfer level: Needs assistance Equipment used: Rolling walker (2 wheels) Transfers: Sit to/from Stand Sit to Stand: Min guard General transfer comment: verbal cues for hand placement and RLE Position, demonstrates good stability on ascent and descent Ambulation/Gait Ambulation/Gait assistance: Supervision, Min guard Gait Distance (Feet): 80 Feet Assistive device: Rolling walker (2 wheels) Gait Pattern/deviations: Step-to pattern, Step-through pattern, Decreased weight shift to right General Gait Details: verbal cues for RW position and safety with turns Gait velocity: decr    ADL: ADL Overall ADL's : Needs assistance/impaired Eating/Feeding: Set up, Sitting Grooming: Wash/dry face, Wash/dry hands, Standing, Min guard Grooming Details (indicate cue type and reason): at sink with RW Upper Body Bathing: Min guard, Sitting Lower Body Bathing: Maximal  assistance, Sit to/from stand, Sitting/lateral leans Upper Body Dressing : Min guard, Sitting Lower Body Dressing: Sitting/lateral leans, Sit to/from stand, Maximal assistance Lower Body Dressing Details (indicate cue type and reason): simulated Toilet Transfer: Minimal assistance, Ambulation, Rolling walker (2 wheels) Toilet Transfer Details (indicate cue type and reason): to transfer from bed to commode in bathroom and back with cues for proper sequencing of task. Toileting- Clothing Manipulation and Hygiene: Minimal assistance, Sit to/from stand  Cognition: Cognition Overall Cognitive Status: Within Functional Limits for tasks assessed Orientation Level: Oriented X4 Cognition Arousal/Alertness:  Awake/alert Behavior During Therapy: WFL for tasks assessed/performed Overall Cognitive Status: Within Functional Limits for tasks assessed General Comments: pt c/o "raw" feeling top of R ankle; RN had placed mepilex at proximal border of R ankle ASO. pt also reporting she believes the grips on the slipper socks may be aggravating her latex allergy; socks and ASL removed, elevated R ankle +ice; some edema and incr area of brusing noted over lateral ankle and dorsum R foot  Physical Exam: Blood pressure (!) 132/54, pulse 80, temperature 98.7 F (37.1 C), temperature source Oral, resp. rate 18, height 5\' 2"  (1.575 m), weight 59 kg, SpO2 100 %. Physical Exam Vitals and nursing note reviewed.  Constitutional:      Appearance: Normal appearance. She is normal weight.     Comments: Awake, alert, appropriate, appears younger than 89 yrs old, NAD, HOH  HENT:     Head: Normocephalic and atraumatic.     Right Ear: External ear normal.     Left Ear: External ear normal.     Nose: Nose normal. No congestion.     Mouth/Throat:     Mouth: Mucous membranes are dry.     Pharynx: Oropharynx is clear. No oropharyngeal exudate.  Eyes:     General:        Right eye: No discharge.        Left eye: No discharge.     Extraocular Movements: Extraocular movements intact.  Cardiovascular:     Rate and Rhythm: Normal rate and regular rhythm.     Heart sounds: Normal heart sounds. No murmur heard.    No gallop.  Pulmonary:     Effort: Pulmonary effort is normal. No respiratory distress.     Breath sounds: Normal breath sounds. No wheezing, rhonchi or rales.  Abdominal:     General: There is distension.     Palpations: Abdomen is soft.     Tenderness: There is no abdominal tenderness.     Comments: Soft, but somewhat distended; hypoactive BS  Musculoskeletal:     Cervical back: Neck supple. No tenderness.     Comments: UE strength 5-/5 in biceps, triceps, WE, grip and FA B/L RLE- HF 4/5; KE/KF 4/5 and  DF/PF 4+/5 LLE- 5-/5 in same muscles Mild swelling of R hip/upper thigh and R ankle- wearing corset lace up ankle brace- foam dressing under it-   Skin:    General: Skin is warm and dry.     Comments: Hip incision clean and dry with original surgical dressing in place.  Appropriately tender R ankle and anterior tibialis are bruised Raw spot on back of R ankle  Neurological:     Mental Status: She is alert and oriented to person, place, and time.     Comments: Patient is alert and oriented x 3 and follows commands. Intact to light touch in all 4 extremities  Psychiatric:        Mood and Affect: Mood  normal.        Behavior: Behavior normal.     Comments: HOH     Results for orders placed or performed during the hospital encounter of 11/03/22 (from the past 48 hour(s))  CBC     Status: Abnormal   Collection Time: 11/08/22  3:41 AM  Result Value Ref Range   WBC 9.4 4.0 - 10.5 K/uL   RBC 2.69 (L) 3.87 - 5.11 MIL/uL   Hemoglobin 8.3 (L) 12.0 - 15.0 g/dL   HCT 26.5 (L) 36.0 - 46.0 %   MCV 98.5 80.0 - 100.0 fL   MCH 30.9 26.0 - 34.0 pg   MCHC 31.3 30.0 - 36.0 g/dL   RDW 13.2 11.5 - 15.5 %   Platelets 156 150 - 400 K/uL   nRBC 0.0 0.0 - 0.2 %    Comment: Performed at [facility], [place] [address], [place], [place] [address]   No results found.    Blood pressure (!) 132/54, pulse 80, temperature 98.7 F (37.1 C), temperature source Oral, resp. rate 18, height 5\' 2"  (1.575 m), weight 59 kg, SpO2 100 %.  Medical Problem List and Plan: 1. Functional deficits secondary to right femoral neck fracture after mechanical fall.  Status post right total hip arthroplasty anterior approach 11/05/2022.  Weightbearing as tolerated  -patient may  shower-cover incision  -ELOS/Goals: 10-14 days- supervision to CGA 2.  Antithrombotics: -DVT/anticoagulation:  Mechanical: Antiembolism stockings, thigh (TED hose) Bilateral lower extremities.  Check vascular study  -antiplatelet  therapy: Aspirin 325 mg twice daily x 18 days then begin aspirin 81 mg daily for 3 weeks then discontinue 3. Pain Management: Hydrocodone/tramadol as needed.  Robaxin as needed for muscle spasms- recommended to take tylenol or tramadol before therapy sessions.  4. Mood/Behavior/Sleep: Xanax 0.25 mg twice daily as needed .Provide emotional support  -antipsychotic agents: N/A 5. Neuropsych/cognition: This patient is capable of making decisions on her own behalf. 6. Skin/Wound Care: Routine skin checks 7. Fluids/Electrolytes/Nutrition: Routine in and outs with follow-up chemistries 8.  Right ankle avulsion fracture.  ASO ankle brace applied.  Weightbearing as tolerated 9.  Acute blood loss anemia.  Follow-up CBC 10.  Hypertension.  Low-dose lisinopril 5 mg daily 11.  Hypothyroidism.  Synthroid 12. Insomnia- will add Trazodone 25-50 mg QHS for sleep prn 13. Constipation- rare Bms- if no BM by tomorrow, will give Sorbitol and soap suds enema if necessary to clean her out.  - also con't benefiber, 2-3x/day- her home dose.       Lavon Paganini Angiulli, PA-C 11/09/2022   I have personally performed a face to face diagnostic evaluation of this patient and formulated the key components of the plan.  Additionally, I have personally reviewed laboratory data, imaging studies, as well as relevant notes and concur with the physician assistant's documentation above.   The patient's status has not changed from the original H&P.  Any changes in documentation from the acute care chart have been noted above.

## 2022-11-08 NOTE — Care Management Important Message (Signed)
Important Message  Patient Details IM Letter given. Name: Dominique Livingston MRN: [id number] Date of Birth: [birth year]   Medicare Important Message Given:  Yes     [name] 11/08/2022, 11:18 AM

## 2022-11-08 NOTE — Progress Notes (Signed)
Physical Therapy Treatment Patient Details Name: Dominique Livingston MRN: [id number] DOB: [birth year] Today's Date: 11/08/2022   History of Present Illness Patient is a 89 y.o. female who presented secondary to right leg pain after fall and found to have a mildly displaced right femoral neck fracture and R ankle sprain/avulsion fx. Orthopedic surgery was consulted and pt is now s/p R AA right total hip arthroplasty on 2/4  PMH: of GERD, hyperthyroidism, hypertension, diverticulosis, IBS, hyperlipidemia,.    PT Comments    Pt progressing toward goals. Having some incr R ankle pain (see comments below). Pt amb hallway distance, reviewed hip exercises and performed on bil LEs. Pt without incr pain, provided with ice to R ankle and hip end of session. Agree with plan for AIR post acute    Recommendations for follow up therapy are one component of a multi-disciplinary discharge planning process, led by the attending physician.  Recommendations may be updated based on patient status, additional functional criteria and insurance authorization.  Follow Up Recommendations  Acute inpatient rehab (3hours/day)     Assistance Recommended at Discharge Intermittent Supervision/Assistance  Patient can return home with the following A little help with walking and/or transfers;A little help with bathing/dressing/bathroom;Assist for transportation;Help with stairs or ramp for entrance;Assistance with cooking/housework   Equipment Recommendations  Other (comment) (defer to next venue)    Recommendations for Other Services       Precautions / Restrictions Precautions Precautions: Fall Required Braces or Orthoses: Other Brace Other Brace: R ankle ASO Restrictions Weight Bearing Restrictions: No LLE Weight Bearing: Weight bearing as tolerated     Mobility  Bed Mobility Overal bed mobility: Needs Assistance Bed Mobility: Supine to Sit, Sit to Supine     Supine to sit: Supervision Sit to supine:  Supervision   General bed mobility comments: incr time, cues to complete progression of RLE off and on bed without physical assist    Transfers Overall transfer level: Needs assistance Equipment used: Rolling walker (2 wheels) Transfers: Sit to/from Stand Sit to Stand: Min guard           General transfer comment: verbal cues for hand placement and RLE Position, demonstrates good stability on ascent and descent    Ambulation/Gait Ambulation/Gait assistance: Supervision, Min guard Gait Distance (Feet): 80 Feet Assistive device: Rolling walker (2 wheels) Gait Pattern/deviations: Step-to pattern, Step-through pattern, Decreased weight shift to right       General Gait Details: verbal cues for RW position and safety with turns   Stairs             Wheelchair Mobility    Modified Rankin (Stroke Patients Only)       Balance   Sitting-balance support: Feet supported, No upper extremity supported Sitting balance-Leahy Scale: Good     Standing balance support: Reliant on assistive device for balance, During functional activity Standing balance-Leahy Scale: Fair                              Cognition Arousal/Alertness: Awake/alert Behavior During Therapy: WFL for tasks assessed/performed Overall Cognitive Status: Within Functional Limits for tasks assessed                                 General Comments: pt c/o "raw" feeling top of R ankle; RN had placed mepilex at proximal border of R ankle ASO. pt also reporting she believes  the grips on the slipper socks may be aggravating her latex allergy; socks and ASL removed, elevated R ankle +ice; some edema and incr area of brusing noted over lateral ankle and dorsum R foot        Exercises General Exercises - Lower Extremity Ankle Circles/Pumps: AROM, Left, 10 reps Quad Sets: AROM, Both, 10 reps Long Arc Quad: AROM, Both, 10 reps, Seated Heel Slides: AROM, Both, 10 reps Hip  ABduction/ADduction: AROM, Both, 10 reps    General Comments        Pertinent Vitals/Pain Pain Assessment Pain Assessment: Faces Faces Pain Scale: Hurts a little bit Pain Location: right hip and ankle Pain Descriptors / Indicators: Discomfort, Sore ("raw"- ankle) Pain Intervention(s): Limited activity within patient's tolerance, Monitored during session, Repositioned, Ice applied (ice to ankle and hip)    Home Living                          Prior Function            PT Goals (current goals can now be found in the care plan section) Acute Rehab PT Goals Patient Stated Goal: be as independent as possible PT Goal Formulation: With patient Time For Goal Achievement: 11/20/22 Potential to Achieve Goals: Good Progress towards PT goals: Progressing toward goals    Frequency    Min 4X/week      PT Plan Current plan remains appropriate    Co-evaluation              AM-PAC PT "6 Clicks" Mobility   Outcome Measure  Help needed turning from your back to your side while in a flat bed without using bedrails?: A Little Help needed moving from lying on your back to sitting on the side of a flat bed without using bedrails?: A Little Help needed moving to and from a bed to a chair (including a wheelchair)?: A Little Help needed standing up from a chair using your arms (e.g., wheelchair or bedside chair)?: A Little Help needed to walk in hospital room?: A Little Help needed climbing 3-5 steps with a railing? : A Little 6 Click Score: 18    End of Session Equipment Utilized During Treatment: Gait belt Activity Tolerance: Patient tolerated treatment well Patient left: in bed;with call bell/phone within reach;with bed alarm set Nurse Communication: Mobility status PT Visit Diagnosis: Other abnormalities of gait and mobility (R26.89);Difficulty in walking, not elsewhere classified (R26.2)     Time: 1696-7893 PT Time Calculation (min) (ACUTE ONLY): 23  min  Charges:  $Gait Training: 8-22 mins $Therapeutic Exercise: 8-22 mins                     Baxter Flattery, PT  Acute Rehab Dept [facility]) [phone]  [facility] Weekend Pager [facility] only)  [phone]  11/08/2022    [facility] 11/08/2022, 3:59 PM

## 2022-11-08 NOTE — TOC Progression Note (Signed)
Transition of Care [facility]) - Progression Note    Patient Details  Name: Dominique Livingston MRN: [id number] Date of Birth: [birth year]  [facility] of [facility]) CM/SW Contact  Lennart Pall, LCSW Phone Number: 11/08/2022, 9:39 AM  Clinical Narrative:    Continue to await bed at [facility].  TOC will continue to follow should there be any change in dc plan.   Expected Discharge Plan: [place] Barriers to Discharge: No Barriers Identified  Expected Discharge Plan and Services In-house Referral: Clinical Social Work     Living arrangements for the past 2 months: Single Family Home                                       Social Determinants of Health (SDOH) Interventions SDOH Screenings   Food Insecurity: No Food Insecurity (11/04/2022)  Housing: Low Risk  (11/04/2022)  Transportation Needs: No Transportation Needs (11/04/2022)  Utilities: Not At Risk (11/04/2022)  Depression (PHQ2-9): Low Risk  (06/22/2022)  Tobacco Use: Low Risk  (11/06/2022)    Readmission Risk Interventions    11/05/2022    2:02 PM  Readmission Risk Prevention Plan  Post Dischage Appt Complete  Medication Screening Not Complete  Transportation Screening Complete

## 2022-11-08 NOTE — Progress Notes (Signed)
   Subjective: 3 Days Post-Op Procedure(s) (LRB): TOTAL HIP ARTHROPLASTY ANTERIOR APPROACH (Right) Patient seen in rounds by Dr. Wynelle Link. Patient is well, and has had no acute complaints or problems. Denies SOB or chest pain. Denies calf pain. Voiding without difficulty. Patient reports pain as mild.  Worked with physical therapy yesterday and ambulated about 100'  Objective: Vital signs in last 24 hours: Temp:  [97.8 F (36.6 C)-98.2 F (36.8 C)] 98.2 F (36.8 C) (02/07 0550) Pulse Rate:  [73] 73 (02/07 0550) Resp:  [16-18] 17 (02/07 0550) BP: (97-122)/(53-56) 97/55 (02/07 0550) SpO2:  [95 %-97 %] 97 % (02/07 0550)  Intake/Output from previous day:  Intake/Output Summary (Last 24 hours) at 11/08/2022 0806 Last data filed at 11/08/2022 0600 Gross per 24 hour  Intake 540 ml  Output 0 ml  Net 540 ml    Intake/Output this shift: No intake/output data recorded.  Labs: Recent Labs    11/06/22 0315 11/07/22 0317 11/08/22 0341  HGB 9.0* 8.1* 8.3*   Recent Labs    11/07/22 0317 11/08/22 0341  WBC 9.4 9.4  RBC 2.59* 2.69*  HCT 25.4* 26.5*  PLT 127* 156   Recent Labs    11/06/22 0315  NA 139  K 4.1  CL 107  CO2 25  BUN 20  CREATININE 0.76  GLUCOSE 112*  CALCIUM 8.2*   No results for input(s): "LABPT", "INR" in the last 72 hours.  Exam: General - Patient is Alert and Oriented Extremity - Neurologically intact Neurovascular intact Sensation intact distally Dorsiflexion/Plantar flexion intact Dressing/Incision - clean, dry, no drainage Motor Function - intact, moving foot and toes well on exam.  Past Medical History:  Diagnosis Date   Allergy    Anxiety    Arthritis    hands , stiffness in knees & hips    Colon polyp, hyperplastic    Diverticulosis    Endometriosis    Fibrocystic breast disease    GERD (gastroesophageal reflux disease)    Hyperlipidemia    Hypertension    Hypothyroidism    IBS (irritable bowel syndrome)    Polymyalgia rheumatica  (HCC)     Assessment/Plan: 3 Days Post-Op Procedure(s) (LRB): TOTAL HIP ARTHROPLASTY ANTERIOR APPROACH (Right) Principal Problem:   Hip fracture (HCC) Active Problems:   Essential hypertension   Hypothyroidism  Estimated body mass index is 23.79 kg/m as calculated from the following:   Height as of this encounter: 5\' 2"  (1.575 m).   Weight as of this encounter: 59 kg.  DVT Prophylaxis - Aspirin Weight-bearing as tolerated.  Continue with PT and OT while admitted. Plan to go to [facility] after hospital stay to work on further mobility. Orthopedic team will see patient back in office in 2 weeks.  Rainey Pines, PA-C Orthopedic Surgery [phone] 11/08/2022, 8:06 AM

## 2022-11-08 NOTE — Progress Notes (Addendum)
Inpatient Rehabilitation Admissions Coordinator   [facility] bed is not available to admit her today. I am hopeful for bed in the next 24 hrs. I contacted her daughter, by phone at pt's bedside, and they are aware. Acute team and TOC made aware.  Danne Baxter, RN, MSN Rehab Admissions Coordinator [phone] 11/08/2022 11:19 AM

## 2022-11-08 NOTE — Plan of Care (Signed)
  Problem: Education: Goal: Knowledge of General Education information will improve Description: Including pain rating scale, medication(s)/side effects and non-pharmacologic comfort measures Outcome: Progressing   Problem: Health Behavior/Discharge Planning: Goal: Ability to manage health-related needs will improve Outcome: Progressing   Problem: Clinical Measurements: Goal: Ability to maintain clinical measurements within normal limits will improve Outcome: Progressing Goal: Will remain free from infection Outcome: Progressing Goal: Diagnostic test results will improve Outcome: Progressing Goal: Respiratory complications will improve Outcome: Progressing Goal: Cardiovascular complication will be avoided Outcome: Progressing   Problem: Activity: Goal: Risk for activity intolerance will decrease Outcome: Progressing   Problem: Nutrition: Goal: Adequate nutrition will be maintained Outcome: Progressing   Problem: Coping: Goal: Level of anxiety will decrease Outcome: Progressing   Problem: Elimination: Goal: Will not experience complications related to bowel motility Outcome: Progressing Goal: Will not experience complications related to urinary retention Outcome: Progressing   Problem: Pain Managment: Goal: General experience of comfort will improve Outcome: Progressing   Problem: Safety: Goal: Ability to remain free from injury will improve Outcome: Progressing   Problem: Skin Integrity: Goal: Risk for impaired skin integrity will decrease Outcome: Progressing   Problem: Education: Goal: Verbalization of understanding the information provided (i.e., activity precautions, restrictions, etc) will improve Outcome: Progressing Goal: Individualized Educational Video(s) Outcome: Progressing   Problem: Activity: Goal: Ability to ambulate and perform ADLs will improve Outcome: Progressing   Problem: Clinical Measurements: Goal: Postoperative complications will be  avoided or minimized Outcome: Progressing   Problem: Self-Concept: Goal: Ability to maintain and perform role responsibilities to the fullest extent possible will improve Outcome: Progressing   Problem: Pain Management: Goal: Pain level will decrease Outcome: Progressing   Problem: Education: Goal: Knowledge of the prescribed therapeutic regimen will improve Outcome: Progressing Goal: Understanding of discharge needs will improve Outcome: Progressing Goal: Individualized Educational Video(s) Outcome: Progressing   Problem: Activity: Goal: Ability to avoid complications of mobility impairment will improve Outcome: Progressing Goal: Ability to tolerate increased activity will improve Outcome: Progressing   Problem: Clinical Measurements: Goal: Postoperative complications will be avoided or minimized Outcome: Progressing   Problem: Pain Management: Goal: Pain level will decrease with appropriate interventions Outcome: Progressing   Problem: Skin Integrity: Goal: Will show signs of wound healing Outcome: Progressing   

## 2022-11-08 NOTE — Progress Notes (Signed)
[facility]                                                                               Dominique Livingston, is a 89 y.o. female, DOB - [birth year], HKV:[id number] Admit date - 11/03/2022    Outpatient Primary MD for the patient is Baxley, Cresenciano Lick, MD  LOS / 4  days    Brief summary   Dominique Livingston is a 89 y.o. female with a history of GERD, hyperthyroidism, hypertension, diverticulosis, IBS, hyperlipidemia,. Patient presented secondary to right leg pain after fall and found to have a mildly displaced right femoral neck fracture. Orthopedic surgery was consulted and performed a successful right total hip arthroplasty on 2/4. PT/OT consulted and are recommending acute inpatient rehabilitation   Assessment & Plan    Assessment and Plan:   Right hip fracture Orthopedics consulted and she underwent right total hip arthroplasty on 11/05/2022 Therapy evaluations recommending [facility] Currently waiting for bed at CIR   Right ankle avulsion fracture Pain control and therapy evaluation.   Postoperative anemia/acute anemia of blood loss Baseline hemoglobin around 13 hemoglobin drop postsurgery and has stabilized now. Transfuse to keep hemoglobin greater than 7.   Essential hypertension Blood pressure parameters are soft, will hold the lisinopril today.    Hypothyroidism:  Resume synthroid.    H/o IBS, diverticulosis:  - stable.    Malnutrition Type:  Nutrition Problem: Inadequate oral intake Etiology: inability to eat   Malnutrition Characteristics:  Signs/Symptoms: NPO status   Nutrition Interventions:  Interventions: Ensure Enlive (each supplement provides 350kcal and 20 grams of protein), Refer to RD note for recommendations, MVI  Estimated body mass index is 23.79 kg/m as calculated from the following:   Height as of this encounter: 5\' 2"  (1.575 m).   Weight as of this encounter: 59 kg.  Code Status: full code.  DVT Prophylaxis:  SCDs Start:  11/05/22 1043 SCDs Start: 11/05/22 1043 Place TED hose Start: 11/05/22 1043 SCDs Start: 11/04/22 0052   Level of Care: Level of care: Med-Surg Family Communication: none at bedside  Disposition Plan:     Remains inpatient appropriate:  waiting for [facility] bed.   Procedures:  None.   Consultants:   Orthopedics.   Antimicrobials:   Anti-infectives (From admission, onward)    Start     Dose/Rate Route Frequency Ordered Stop   11/05/22 1400  ceFAZolin (ANCEF) IVPB 2g/100 mL premix        2 g 200 mL/hr over 30 Minutes Intravenous Every 6 hours 11/05/22 1042 11/05/22 2023   11/05/22 1400  ceFAZolin (ANCEF) IVPB 2g/100 mL premix  Status:  Discontinued        2 g 200 mL/hr over 30 Minutes Intravenous Every 6 hours 11/05/22 1042 11/05/22 1049   11/05/22 0800  ceFAZolin (ANCEF) IVPB 2g/100 mL premix        2 g 200 mL/hr over 30 Minutes Intravenous  Once 11/05/22 0702 11/05/22 0814   11/05/22 0712  ceFAZolin (ANCEF) 2-4 GM/100ML-% IVPB       Note to Pharmacy: Dara Lords M: cabinet override      11/05/22 [phone] 11/05/22 3295  Medications  Scheduled Meds:  aspirin EC  325 mg Oral BID   docusate sodium  100 mg Oral BID   feeding supplement  237 mL Oral BID BM   levothyroxine  75 mcg Oral Daily   lisinopril  5 mg Oral Daily   multivitamin with minerals  1 tablet Oral Daily   polyvinyl alcohol  1 drop Both Eyes BID   psyllium  1 packet Oral BID   Continuous Infusions:  methocarbamol (ROBAXIN) IV 500 mg (11/05/22 1820)   methocarbamol (ROBAXIN) IV     PRN Meds:.acetaminophen, ALPRAZolam, bisacodyl, diphenhydrAMINE, HYDROcodone-acetaminophen, HYDROcodone-acetaminophen, menthol-cetylpyridinium **OR** phenol, [DISCONTINUED] methocarbamol **OR** methocarbamol (ROBAXIN) IV, methocarbamol **OR** methocarbamol (ROBAXIN) IV, metoCLOPramide **OR** metoCLOPramide (REGLAN) injection, [DISCONTINUED] metoCLOPramide **OR** metoCLOPramide (REGLAN) injection, morphine injection, morphine  injection, ondansetron **OR** ondansetron (ZOFRAN) IV, polyethylene glycol, sodium phosphate, traMADol    Subjective:   Dominique Livingston was seen and examined today.  No new complaints.   Objective:   Vitals:   11/07/22 1316 11/07/22 2105 11/08/22 0550 11/08/22 0835  BP: (!) 122/53 (!) 99/56 (!) 97/55 97/60  Pulse: 73 73 73 77  Resp: 18 16 17    Temp: 97.8 F (36.6 C) 98 F (36.7 C) 98.2 F (36.8 C)   TempSrc: Oral Oral Oral   SpO2: 95% 96% 97%   Weight:      Height:        Intake/Output Summary (Last 24 hours) at 11/08/2022 1119 Last data filed at 11/08/2022 1000 Gross per 24 hour  Intake 780 ml  Output 200 ml  Net 580 ml   Filed Weights   11/04/22 2205  Weight: 59 kg     Exam General: Alert and oriented x 3, NAD Cardiovascular: S1 S2 auscultated, no murmurs, RRR Respiratory: Clear to auscultation bilaterally, no wheezing, rales or rhonchi Gastrointestinal: Soft, nontender, nondistended, + bowel sounds Ext: no pedal edema bilaterally Neuro: AAOx3, Cr N's II- XII. Strength 5/5 upper and lower extremities bilaterally Skin: No rashes Psych: Normal affect and demeanor, alert and oriented x3    Data Reviewed:  I have personally reviewed following labs and imaging studies   CBC Lab Results  Component Value Date   WBC 9.4 11/08/2022   RBC 2.69 (L) 11/08/2022   HGB 8.3 (L) 11/08/2022   HCT 26.5 (L) 11/08/2022   MCV 98.5 11/08/2022   MCH 30.9 11/08/2022   PLT 156 11/08/2022   MCHC 31.3 11/08/2022   RDW 13.2 11/08/2022   LYMPHSABS 1.6 11/03/2022   MONOABS 0.5 11/03/2022   EOSABS 0.0 11/03/2022   BASOSABS 0.1 42/35/3614     Last metabolic panel Lab Results  Component Value Date   NA 139 11/06/2022   K 4.1 11/06/2022   CL 107 11/06/2022   CO2 25 11/06/2022   BUN 20 11/06/2022   CREATININE 0.76 11/06/2022   GLUCOSE 112 (H) 11/06/2022   GFRNONAA >60 11/06/2022   GFRAA 72 05/28/2020   CALCIUM 8.2 (L) 11/06/2022   PROT 8.4 (H) 06/22/2022   ALBUMIN 4.5  04/08/2022   BILITOT 0.5 06/22/2022   ALKPHOS 56 04/08/2022   AST 39 (H) 06/22/2022   ALT 57 (H) 06/22/2022   ANIONGAP 7 11/06/2022    CBG (last 3)  No results for input(s): "GLUCAP" in the last 72 hours.    Coagulation Profile: Recent Labs  Lab 11/03/22 2250  INR 1.0     Radiology Studies: No results found.     Hosie Poisson M.D. [facility] 11/08/2022, 11:19 AM  Available via Epic secure  chat 7am-7pm After 7 pm, please refer to night coverage provider listed on amion.

## 2022-11-09 ENCOUNTER — Inpatient Hospital Stay (HOSPITAL_COMMUNITY)
Admission: RE | Admit: 2022-11-09 | Discharge: 2022-11-15 | DRG: 560 | Disposition: A | Discharge status: Home / Self Care | Payer: Medicare Other | Source: Intra-hospital | Attending: Physical Medicine and Rehabilitation | Admitting: Physical Medicine and Rehabilitation

## 2022-11-09 ENCOUNTER — Inpatient Hospital Stay (HOSPITAL_COMMUNITY): Payer: Medicare Other

## 2022-11-09 ENCOUNTER — Encounter (HOSPITAL_COMMUNITY): Payer: Self-pay | Admitting: Physical Medicine and Rehabilitation

## 2022-11-09 ENCOUNTER — Other Ambulatory Visit: Payer: Self-pay

## 2022-11-09 DIAGNOSIS — Z90710 Acquired absence of both cervix and uterus: Secondary | ICD-10-CM | POA: Diagnosis not present

## 2022-11-09 DIAGNOSIS — Z96641 Presence of right artificial hip joint: Secondary | ICD-10-CM | POA: Diagnosis present

## 2022-11-09 DIAGNOSIS — Z91040 Latex allergy status: Secondary | ICD-10-CM | POA: Diagnosis not present

## 2022-11-09 DIAGNOSIS — Z9842 Cataract extraction status, left eye: Secondary | ICD-10-CM | POA: Diagnosis not present

## 2022-11-09 DIAGNOSIS — Z8719 Personal history of other diseases of the digestive system: Secondary | ICD-10-CM

## 2022-11-09 DIAGNOSIS — Z825 Family history of asthma and other chronic lower respiratory diseases: Secondary | ICD-10-CM | POA: Diagnosis not present

## 2022-11-09 DIAGNOSIS — Z79890 Hormone replacement therapy (postmenopausal): Secondary | ICD-10-CM | POA: Diagnosis not present

## 2022-11-09 DIAGNOSIS — K219 Gastro-esophageal reflux disease without esophagitis: Secondary | ICD-10-CM | POA: Diagnosis present

## 2022-11-09 DIAGNOSIS — E039 Hypothyroidism, unspecified: Secondary | ICD-10-CM | POA: Diagnosis present

## 2022-11-09 DIAGNOSIS — E785 Hyperlipidemia, unspecified: Secondary | ICD-10-CM | POA: Diagnosis present

## 2022-11-09 DIAGNOSIS — Z9841 Cataract extraction status, right eye: Secondary | ICD-10-CM

## 2022-11-09 DIAGNOSIS — D62 Acute posthemorrhagic anemia: Secondary | ICD-10-CM | POA: Diagnosis present

## 2022-11-09 DIAGNOSIS — Z471 Aftercare following joint replacement surgery: Principal | ICD-10-CM

## 2022-11-09 DIAGNOSIS — I10 Essential (primary) hypertension: Secondary | ICD-10-CM | POA: Diagnosis present

## 2022-11-09 DIAGNOSIS — R3915 Urgency of urination: Secondary | ICD-10-CM | POA: Diagnosis present

## 2022-11-09 DIAGNOSIS — W1809XD Striking against other object with subsequent fall, subsequent encounter: Secondary | ICD-10-CM

## 2022-11-09 DIAGNOSIS — Z8249 Family history of ischemic heart disease and other diseases of the circulatory system: Secondary | ICD-10-CM | POA: Diagnosis not present

## 2022-11-09 DIAGNOSIS — Z79899 Other long term (current) drug therapy: Secondary | ICD-10-CM

## 2022-11-09 DIAGNOSIS — Z888 Allergy status to other drugs, medicaments and biological substances status: Secondary | ICD-10-CM | POA: Diagnosis not present

## 2022-11-09 DIAGNOSIS — Z880 Allergy status to penicillin: Secondary | ICD-10-CM

## 2022-11-09 DIAGNOSIS — M7989 Other specified soft tissue disorders: Secondary | ICD-10-CM

## 2022-11-09 DIAGNOSIS — Z9049 Acquired absence of other specified parts of digestive tract: Secondary | ICD-10-CM

## 2022-11-09 DIAGNOSIS — S82891D Other fracture of right lower leg, subsequent encounter for closed fracture with routine healing: Secondary | ICD-10-CM | POA: Diagnosis not present

## 2022-11-09 DIAGNOSIS — G4700 Insomnia, unspecified: Secondary | ICD-10-CM | POA: Diagnosis present

## 2022-11-09 DIAGNOSIS — K581 Irritable bowel syndrome with constipation: Secondary | ICD-10-CM | POA: Diagnosis present

## 2022-11-09 DIAGNOSIS — N809 Endometriosis, unspecified: Secondary | ICD-10-CM | POA: Diagnosis present

## 2022-11-09 DIAGNOSIS — S93401D Sprain of unspecified ligament of right ankle, subsequent encounter: Secondary | ICD-10-CM | POA: Diagnosis not present

## 2022-11-09 DIAGNOSIS — K5903 Drug induced constipation: Secondary | ICD-10-CM

## 2022-11-09 DIAGNOSIS — M25551 Pain in right hip: Secondary | ICD-10-CM | POA: Diagnosis not present

## 2022-11-09 DIAGNOSIS — Z8739 Personal history of other diseases of the musculoskeletal system and connective tissue: Secondary | ICD-10-CM

## 2022-11-09 DIAGNOSIS — M353 Polymyalgia rheumatica: Secondary | ICD-10-CM | POA: Diagnosis present

## 2022-11-09 DIAGNOSIS — S72001D Fracture of unspecified part of neck of right femur, subsequent encounter for closed fracture with routine healing: Secondary | ICD-10-CM

## 2022-11-09 DIAGNOSIS — M62838 Other muscle spasm: Secondary | ICD-10-CM | POA: Diagnosis present

## 2022-11-09 DIAGNOSIS — H9190 Unspecified hearing loss, unspecified ear: Secondary | ICD-10-CM | POA: Diagnosis present

## 2022-11-09 DIAGNOSIS — S72009A Fracture of unspecified part of neck of unspecified femur, initial encounter for closed fracture: Secondary | ICD-10-CM | POA: Diagnosis present

## 2022-11-09 DIAGNOSIS — S82891A Other fracture of right lower leg, initial encounter for closed fracture: Secondary | ICD-10-CM | POA: Diagnosis present

## 2022-11-09 LAB — BPAM RBC
Blood Product Expiration Date: 202402202359
Blood Product Expiration Date: 202402262359
Unit Type and Rh: 6200
Unit Type and Rh: 6200

## 2022-11-09 LAB — TYPE AND SCREEN
ABO/RH(D): A POS
Antibody Screen: POSITIVE
Donor AG Type: NEGATIVE
Donor AG Type: NEGATIVE
Unit division: 0
Unit division: 0

## 2022-11-09 MED ORDER — ALPRAZOLAM 0.25 MG PO TABS: 0.2500 mg | ORAL_TABLET | Freq: Two times a day (BID) | ORAL | Status: DC | PRN

## 2022-11-09 MED ORDER — POLYVINYL ALCOHOL 1.4 % OP SOLN
1.0000 [drp] | Freq: Two times a day (BID) | OPHTHALMIC | Status: DC
  Administered 2022-11-09 – 2022-11-15 (×12): 1 [drp] via OPHTHALMIC
  Filled 2022-11-09: qty 15

## 2022-11-09 MED ORDER — DOCUSATE SODIUM 100 MG PO CAPS: 100.0000 mg | ORAL_CAPSULE | Freq: Two times a day (BID) | ORAL | 0 refills | Status: DC | PRN

## 2022-11-09 MED ORDER — LISINOPRIL 5 MG PO TABS
5.0000 mg | ORAL_TABLET | Freq: Every day | ORAL | Status: DC
  Administered 2022-11-09 – 2022-11-12 (×4): 5 mg via ORAL
  Filled 2022-11-09 (×4): qty 1

## 2022-11-09 MED ORDER — HYDROCODONE-ACETAMINOPHEN 5-325 MG PO TABS
1.0000 | ORAL_TABLET | ORAL | Status: DC | PRN
  Administered 2022-11-10 – 2022-11-12 (×2): 1 via ORAL
  Administered 2022-11-12 – 2022-11-13 (×2): 2 via ORAL
  Filled 2022-11-09: qty 1
  Filled 2022-11-09 (×3): qty 2
  Filled 2022-11-09: qty 1
  Filled 2022-11-09: qty 2

## 2022-11-09 MED ORDER — ENSURE ENLIVE PO LIQD: 237.0000 mL | Freq: Two times a day (BID) | ORAL | 12 refills | Status: DC

## 2022-11-09 MED ORDER — BISACODYL 10 MG RE SUPP: 10.0000 mg | Freq: Every day | RECTAL | 0 refills | Status: DC | PRN

## 2022-11-09 MED ORDER — BISACODYL 10 MG RE SUPP: 10.0000 mg | Freq: Every day | RECTAL | Status: DC | PRN

## 2022-11-09 MED ORDER — METHOCARBAMOL 1000 MG/10ML IJ SOLN: 500.0000 mg | Freq: Four times a day (QID) | INTRAVENOUS | Status: DC | PRN

## 2022-11-09 MED ORDER — ASPIRIN 325 MG PO TBEC
325.0000 mg | DELAYED_RELEASE_TABLET | Freq: Two times a day (BID) | ORAL | Status: DC
  Administered 2022-11-09 – 2022-11-15 (×12): 325 mg via ORAL
  Filled 2022-11-09 (×12): qty 1

## 2022-11-09 MED ORDER — TRAZODONE HCL 50 MG PO TABS
25.0000 mg | ORAL_TABLET | Freq: Every day | ORAL | Status: DC
  Administered 2022-11-09 – 2022-11-11 (×3): 50 mg via ORAL
  Filled 2022-11-09 (×3): qty 1

## 2022-11-09 MED ORDER — ACETAMINOPHEN 325 MG PO TABS
325.0000 mg | ORAL_TABLET | Freq: Four times a day (QID) | ORAL | Status: DC | PRN
  Administered 2022-11-10 – 2022-11-13 (×2): 650 mg via ORAL
  Filled 2022-11-09 (×2): qty 2

## 2022-11-09 MED ORDER — DIPHENHYDRAMINE HCL 12.5 MG/5ML PO ELIX: 12.5000 mg | ORAL_SOLUTION | ORAL | 0 refills | Status: DC | PRN

## 2022-11-09 MED ORDER — PSYLLIUM 95 % PO PACK
1.0000 | PACK | Freq: Two times a day (BID) | ORAL | Status: DC
  Administered 2022-11-09 – 2022-11-14 (×8): 1 via ORAL
  Filled 2022-11-09 (×11): qty 1

## 2022-11-09 MED ORDER — ONDANSETRON HCL 4 MG/2ML IJ SOLN: 4.0000 mg | Freq: Four times a day (QID) | INTRAMUSCULAR | Status: DC | PRN

## 2022-11-09 MED ORDER — LEVOTHYROXINE SODIUM 75 MCG PO TABS
75.0000 ug | ORAL_TABLET | Freq: Every day | ORAL | Status: DC
  Administered 2022-11-10 – 2022-11-15 (×6): 75 ug via ORAL
  Filled 2022-11-09 (×6): qty 1

## 2022-11-09 MED ORDER — TRAMADOL HCL 50 MG PO TABS
50.0000 mg | ORAL_TABLET | Freq: Four times a day (QID) | ORAL | Status: DC | PRN
  Administered 2022-11-14: 100 mg via ORAL
  Filled 2022-11-09: qty 2

## 2022-11-09 MED ORDER — POLYETHYLENE GLYCOL 3350 17 G PO PACK: 17.0000 g | PACK | Freq: Every day | ORAL | 0 refills | Status: DC | PRN

## 2022-11-09 MED ORDER — ENSURE ENLIVE PO LIQD
237.0000 mL | Freq: Two times a day (BID) | ORAL | Status: DC
  Administered 2022-11-09 – 2022-11-14 (×7): 237 mL via ORAL

## 2022-11-09 MED ORDER — ADULT MULTIVITAMIN W/MINERALS CH
1.0000 | ORAL_TABLET | Freq: Every day | ORAL | Status: DC
  Administered 2022-11-10 – 2022-11-15 (×6): 1 via ORAL
  Filled 2022-11-09 (×6): qty 1

## 2022-11-09 MED ORDER — METHOCARBAMOL 500 MG PO TABS: 500.0000 mg | ORAL_TABLET | Freq: Four times a day (QID) | ORAL | Status: DC | PRN

## 2022-11-09 MED ORDER — ONDANSETRON HCL 4 MG PO TABS: 4.0000 mg | ORAL_TABLET | Freq: Four times a day (QID) | ORAL | Status: DC | PRN

## 2022-11-09 MED ORDER — DOCUSATE SODIUM 100 MG PO CAPS
100.0000 mg | ORAL_CAPSULE | Freq: Two times a day (BID) | ORAL | Status: DC
  Administered 2022-11-09 – 2022-11-14 (×11): 100 mg via ORAL
  Filled 2022-11-09 (×12): qty 1

## 2022-11-09 MED ORDER — POLYETHYLENE GLYCOL 3350 17 G PO PACK: 17.0000 g | PACK | Freq: Every day | ORAL | Status: DC | PRN

## 2022-11-09 NOTE — Progress Notes (Signed)
Inpatient Rehabilitation Admissions Coordinator   I have CIR bed to admit her to today. I contacted both patient and daughter by phone and they are in agreement. I have alerted acute team and TOC and will make the arrangements. Dr Dagoberto Ligas will be admitting Rehab MD, room number pending. I will arrange [facility] transport when room is available. [facility] staff to provide [facility] transport paperwork needed.  Danne Baxter, RN, MSN Rehab Admissions Coordinator [phone] 11/09/2022 10:04 AM

## 2022-11-09 NOTE — Progress Notes (Signed)
Orthopedic Tech Progress Note Patient Details:  [name] 11/13/1932 [id number]  RN called for [place] to come adjust existing ASO lace up ankle brace. Brace adjusted to fit better, but will most likely continue to bunch up behind her ankle due to inability to bring foot into dorsiflexion.  [name], female   DOB: 05-Jul-1933, 89 y.o.   MRN: [id number]  Carin Primrose 11/09/2022, 3:46 PM

## 2022-11-09 NOTE — Progress Notes (Signed)
Patient arrived to unit and oriented to unit routine, no questions at this time. Verbalized willingness to call for assistance.

## 2022-11-09 NOTE — Discharge Summary (Signed)
Physician Discharge Summary   Patient: Dominique Livingston MRN: [id number] DOB: 1932/10/17  Admit date:     11/03/2022  Discharge date: 11/09/22  Discharge Physician: Hosie Poisson   PCP: Elby Showers, MD   Recommendations at discharge:  Please follow up with PCP in one week.  Please follow up with orthopedics as recommended.   Discharge Diagnoses: Principal Problem:   Hip fracture [facility]) Active Problems:   Essential hypertension   Hypothyroidism    Hospital Course: Dominique Livingston is a 89 y.o. female with a history of GERD, hyperthyroidism, hypertension, diverticulosis, IBS, hyperlipidemia,. Patient presented secondary to right leg pain after fall and found to have a mildly displaced right femoral neck fracture. Orthopedic surgery was consulted and performed a successful right total hip arthroplasty on 2/4. PT/OT consulted and are recommending acute inpatient rehabilitation  Assessment and Plan:   Right hip fracture Orthopedics consulted and she underwent right total hip arthroplasty on 11/05/2022 Therapy evaluations recommending CIR Currently waiting for bed at CIR     Right ankle avulsion fracture Pain control and therapy evaluation.     Postoperative anemia/acute anemia of blood loss Baseline hemoglobin around 13 hemoglobin drop postsurgery and has stabilized now. Transfuse to keep hemoglobin greater than 7.     Essential hypertension Blood pressure parameters are soft, will hold the lisinopril today.      Hypothyroidism:  Resume synthroid.      H/o IBS, diverticulosis:  - stable.      Malnutrition Type:   Nutrition Problem: Inadequate oral intake Etiology: inability to eat     Malnutrition Characteristics:   Signs/Symptoms: NPO status     Nutrition Interventions:   Interventions: Ensure Enlive (each supplement provides 350kcal and 20 grams of protein), Refer to RD note for recommendations, MVI   Estimated body mass index is 23.79 kg/m as  calculated from the following:   Height as of this encounter: 5\' 2"  (1.575 m).   Weight as of this encounter: 59 kg.    Consultants: orthopedics.  Procedures performed:  right total hip arthroplasty on 11/05/2022  Disposition: Rehabilitation facility Diet recommendation:  Discharge Diet Orders (From admission, onward)     Start     Ordered   11/09/22 0000  Diet - low sodium heart healthy        11/09/22 1017           Regular diet DISCHARGE MEDICATION: Allergies as of 11/09/2022       Reactions   Amoxicillin-pot Clavulanate Nausea Only   Erythromycin Nausea And Vomiting   Mushroom Extract Complex Other (See Comments)   Cantalope, adding enviromental allergens such as grass & trees Had allergy test   Latex Rash   Macrobid [nitrofurantoin Monohyd Macro] Rash   Zetia [ezetimibe] Rash        Medication List     TAKE these medications    ALPRAZolam 0.25 MG tablet Commonly known as: XANAX Take 0.25 mg by mouth 2 (two) times daily as needed for anxiety.   aspirin EC 325 MG tablet Take 1 tablet (325 mg total) by mouth 2 (two) times daily for 18 days. Then take one 81 mg aspirin once a day for three weeks. Then discontinue aspirin.   bisacodyl 10 MG suppository Commonly known as: DULCOLAX Place 1 suppository (10 mg total) rectally daily as needed for moderate constipation.   diphenhydrAMINE 12.5 MG/5ML elixir Commonly known as: BENADRYL Take 5-10 mLs (12.5-25 mg total) by mouth every 4 (four) hours as needed for  itching.   docusate sodium 100 MG capsule Commonly known as: COLACE Take 1 capsule (100 mg total) by mouth 2 (two) times daily as needed for mild constipation.   feeding supplement Liqd Take 237 mLs by mouth 2 (two) times daily between meals.   HYDROcodone-acetaminophen 5-325 MG tablet Commonly known as: NORCO/VICODIN Take 1-2 tablets by mouth every 6 (six) hours as needed for severe pain.   levothyroxine 75 MCG tablet Commonly known as: SYNTHROID Take  1 tablet by mouth once daily What changed: when to take this   lisinopril 5 MG tablet Commonly known as: ZESTRIL Take 1 tablet by mouth once daily   methocarbamol 500 MG tablet Commonly known as: ROBAXIN Take 1 tablet (500 mg total) by mouth every 6 (six) hours as needed for muscle spasms.   multivitamin tablet Take 1 tablet by mouth daily.   polyethylene glycol 17 g packet Commonly known as: MIRALAX / GLYCOLAX Take 17 g by mouth daily as needed for mild constipation.   psyllium 28 % packet Commonly known as: METAMUCIL SMOOTH TEXTURE Take 1 packet by mouth 2 (two) times daily.   SYSTANE ULTRA OP Place 1 drop into both eyes 2 (two) times daily.   traMADol 50 MG tablet Commonly known as: ULTRAM Take 1-2 tablets (50-100 mg total) by mouth every 6 (six) hours as needed for moderate pain.        Follow-up Information     Gaynelle Arabian, MD. Schedule an appointment as soon as possible for a visit in 2 week(s).   Specialty: Orthopedic Surgery Contact information: [address] [place] [address] [phone]                Discharge Exam: Dominique Livingston Weights   11/04/22 2205  Weight: 59 kg   General exam: Appears calm and comfortable  Respiratory system: Clear to auscultation. Respiratory effort normal. Cardiovascular system: S1 & S2 heard, RRR. No JVD, murmurs, rubs, gallops or clicks. No pedal edema. Gastrointestinal system: Abdomen is nondistended, soft and nontender. No organomegaly or masses felt. Normal bowel sounds heard. Central nervous system: Alert and oriented. No focal neurological deficits. Extremities: Symmetric 5 x 5 power. Skin: No rashes, lesions or ulcers Psychiatry: Judgement and insight appear normal. Mood & affect appropriate.    Condition at discharge: fair  The results of significant diagnostics from this hospitalization (including imaging, microbiology, ancillary and laboratory) are listed below for reference.   Imaging  Studies: Pelvis Portable  Result Date: 11/05/2022 CLINICAL DATA:  Right femoral neck fracture status post arthroplasty EXAM: PORTABLE PELVIS 1-2 VIEWS COMPARISON:  11/03/2022 FINDINGS: Single frontal view of the pelvis includes both hips. Right hip arthroplasty is identified in the expected position without evidence of acute complication. Postsurgical changes are seen within the soft tissues overlying the right hip. Stable mild left hip osteoarthritis. No acute fractures. IMPRESSION: 1. Unremarkable right hip arthroplasty. Electronically Signed   By: Randa Ngo M.D.   On: 11/05/2022 16:14   DG HIP UNILAT WITH PELVIS 1V RIGHT  Result Date: 11/05/2022 CLINICAL DATA:  Status post right hip arthroplasty. EXAM: DG HIP (WITH OR WITHOUT PELVIS) 1V RIGHT COMPARISON:  11/03/2022 FINDINGS: Two images obtained via portable C-arm radiography in the operating room were submitted. There is been interval right total hip arthroplasty. Hardware components are in anatomic alignment. Surgical drainage catheter and surgical sponge are identified in lateral to right hip. IMPRESSION: Status post right total hip arthroplasty. Surgical drainage catheter and sponge noted lateral to the right  hip. Electronically Signed   By: Kerby Moors M.D.   On: 11/05/2022 09:33   DG C-Arm 1-60 Min-No Report  Result Date: 11/05/2022 Fluoroscopy was utilized by the requesting physician.  No radiographic interpretation.   DG C-Arm 1-60 Min-No Report  Result Date: 11/05/2022 Fluoroscopy was utilized by the requesting physician.  No radiographic interpretation.   DG Hip Unilat With Pelvis 2-3 Views Right  Addendum Date: 11/04/2022   ADDENDUM REPORT: 11/04/2022 00:46 ADDENDUM: Right ankle: Bony densities are noted inferior to the lateral malleolus suggesting avulsion fractures of indeterminate age. No dislocation. Electronically Signed   By: Brett Fairy M.D.   On: 11/04/2022 00:46   Result Date: 11/04/2022 CLINICAL DATA:  Fall, hip  injury. EXAM: RIGHT ANKLE - COMPLETE 3+ VIEW; DG HIP (WITH OR WITHOUT PELVIS) 2-3V RIGHT COMPARISON:  None Available. FINDINGS: There is a fracture involving the femoral neck on the right with superior subluxation of the distal fracture fragment. No dislocation. The remaining bony structures are intact. The soft tissues are unremarkable. IMPRESSION: Mildly displaced femoral neck fracture on the right. Electronically Signed: By: Brett Fairy M.D. On: 11/03/2022 23:51   DG Ankle Complete Right  Addendum Date: 11/04/2022   ADDENDUM REPORT: 11/04/2022 00:46 ADDENDUM: Right ankle: Bony densities are noted inferior to the lateral malleolus suggesting avulsion fractures of indeterminate age. No dislocation. Electronically Signed   By: Brett Fairy M.D.   On: 11/04/2022 00:46   Result Date: 11/04/2022 CLINICAL DATA:  Fall, hip injury. EXAM: RIGHT ANKLE - COMPLETE 3+ VIEW; DG HIP (WITH OR WITHOUT PELVIS) 2-3V RIGHT COMPARISON:  None Available. FINDINGS: There is a fracture involving the femoral neck on the right with superior subluxation of the distal fracture fragment. No dislocation. The remaining bony structures are intact. The soft tissues are unremarkable. IMPRESSION: Mildly displaced femoral neck fracture on the right. Electronically Signed: By: Brett Fairy M.D. On: 11/03/2022 23:51   DG Chest Portable 1 View  Result Date: 11/03/2022 CLINICAL DATA:  [place], hip fracture, preoperative evaluation EXAM: PORTABLE CHEST 1 VIEW COMPARISON:  08/15/2013 FINDINGS: Single frontal view of the chest demonstrates an unremarkable cardiac silhouette. No airspace disease, effusion, or pneumothorax. No acute bony abnormality. IMPRESSION: 1. No acute intrathoracic process. Electronically Signed   By: Randa Ngo M.D.   On: 11/03/2022 23:45    Microbiology: Results for orders placed or performed during the hospital encounter of 11/03/22  Surgical pcr screen     Status: None   Collection Time: 11/04/22 11:51 PM    Specimen: Nasal Mucosa; Nasal Swab  Result Value Ref Range Status   MRSA, PCR NEGATIVE NEGATIVE Final   Staphylococcus aureus NEGATIVE NEGATIVE Final    Comment: (NOTE) The Xpert SA Assay (FDA approved for NASAL specimens in patients 67 years of age and older), is one component of a comprehensive surveillance program. It is not intended to diagnose infection nor to guide or monitor treatment. Performed at [facility], [place] [address] [place], [place] [address]     Labs: CBC: Recent Labs  Lab 11/03/22 2250 11/04/22 0419 11/06/22 0315 11/07/22 0317 11/08/22 0341  WBC 11.4* 8.8 8.9 9.4 9.4  NEUTROABS 9.1*  --   --   --   --   HGB 11.1* 10.9* 9.0* 8.1* 8.3*  HCT 33.4* 32.5* 27.9* 25.4* 26.5*  MCV 95.4 94.2 96.2 98.1 98.5  PLT 159 148* 132* 127* 505   Basic Metabolic Panel: Recent Labs  Lab 11/03/22 2250 11/04/22 0419 11/06/22 0315  NA 140 139 139  K 3.9 4.4 4.1  CL 102 106 107  CO2 25 25 25   GLUCOSE 144* 135* 112*  BUN 19 15 20   CREATININE 0.84 0.87 0.76  CALCIUM 9.1 8.8* 8.2*   Liver Function Tests: No results for input(s): "AST", "ALT", "ALKPHOS", "BILITOT", "PROT", "ALBUMIN" in the last 168 hours. CBG: No results for input(s): "GLUCAP" in the last 168 hours.  Discharge time spent: 39 minutes.   Signed: , MD [facility] 11/09/2022

## 2022-11-09 NOTE — Progress Notes (Signed)
Patient off unit via [facility] to [facility]. All belongings w/ patient except flowers and vase. Report given to Stacy at [facility]. Dtr aware of transfer.

## 2022-11-09 NOTE — TOC Transition Note (Signed)
Transition of Care [facility]) - CM/SW Discharge Note   Patient Details  Name: Dominique Livingston MRN: [id number] Date of Birth: 1933/08/22  [facility] CM/SW Contact:  Lennart Pall, LCSW Phone Number: 11/09/2022, 10:09 AM   Clinical Narrative:    Bed available today at CIR and pt/ family aware/ agreeable with transfer.  [facility] admissions arranged [facility] transport.  No TOC needs.   Final next level of care: IP Rehab Facility Barriers to Discharge: Barriers Resolved   Patient Goals and CMS Choice CMS Medicare.gov Compare Post Acute Care list provided to:: Other (Comment Required) (Patient daughter, CSW advised to look at [facility])    Discharge Placement                         Discharge Plan and Services Additional resources added to the After Visit Summary for   In-house Referral: Clinical Social Work              DME Arranged: N/A DME Agency: NA                  Social Determinants of Health ([place]) Interventions SDOH Screenings   Food Insecurity: No Food Insecurity (11/04/2022)  Housing: Low Risk  (11/04/2022)  Transportation Needs: No Transportation Needs (11/04/2022)  Utilities: Not At Risk (11/04/2022)  Depression (PHQ2-9): Low Risk  (06/22/2022)  Tobacco Use: Low Risk  (11/06/2022)     Readmission Risk Interventions    11/05/2022    2:02 PM  Readmission Risk Prevention Plan  Post Dischage Appt Complete  Medication Screening Not Complete  Transportation Screening Complete

## 2022-11-09 NOTE — H&P (Signed)
Expand All Collapse All      Physical Medicine and Rehabilitation Admission H&P        Chief Complaint  Patient presents with   Hip Pain  : HPI: Dominique Livingston is a 89 year old right-handed female with history of hypertension, endometriosis status post remote right hemicolectomy, IBS, hyperlipidemia, polymyalgia rheumatica.  Per chart review patient lives alone.  Independent and active prior to admission.  Presented 11/03/2022 after mechanical fall at home after she tripped on the carpet.  Denied loss of consciousness.  Patient notes she was on the floor x 1 hour and was unable to reach the phone.  Admission chemistries unremarkable except glucose 144, WBC 11,400, hemoglobin 11.1.  X-rays and imaging revealed right femoral neck fracture.  Underwent right total hip arthroplasty, anterior approach 11/05/2022 per Dr. Despina Hick.  Patient is weightbearing as tolerated.  Patient also sustained right ankle avulsion fracture conservative care with ASO brace applied.  Hospital course acute blood loss anemia 8.3 and monitored.  Presently on aspirin 325 mg twice daily for DVT prophylaxis.  Blood pressure soft maintained on low-dose lisinopril.  Therapy evaluations completed due to patient decreased functional mobility was admitted for a comprehensive rehab program.     Pt reports no hip pain at rest, but with movement has RLE pain 2-3/10.  Hasn't taken pain meds since overnight last night. Has general soreness/achiness, but not pain, per se.    Not sleeping well in hospital- interested in meds to help.    Battling constipation since 7/23- LBM -very small yesterday but only small BM Monday and prior to that, last Thursday x3- got cleaned out 1 week ago. No change in appetite, no nausea.    Peeing OK, however doesn't have good control. "Leaking some" when transferred to bed from stretcher- is new- no dysuria; no frequency, but thinks since cannot easily get OOB, it's slowing her down.    Has corset type  brace/splint for R ankle- has rubbed her raw on back of R ankle, so they are putting foam dressing under it as well.      Review of Systems  Constitutional:  Negative for chills and fever.  HENT:  Negative for hearing loss.   Eyes:  Negative for blurred vision and double vision.  Respiratory:  Negative for cough and shortness of breath.   Cardiovascular:  Positive for leg swelling. Negative for chest pain and palpitations.  Gastrointestinal:  Positive for constipation. Negative for heartburn, nausea and vomiting.       GERD  Genitourinary:  Negative for dysuria, flank pain and hematuria.  Musculoskeletal:  Positive for joint pain and myalgias.  Skin:  Negative for rash.  Psychiatric/Behavioral:         Anxiety  All other systems reviewed and are negative.       Past Medical History:  Diagnosis Date   Allergy     Anxiety     Arthritis      hands , stiffness in knees & hips    Colon polyp, hyperplastic     Diverticulosis     Endometriosis     Fibrocystic breast disease     GERD (gastroesophageal reflux disease)     Hyperlipidemia     Hypertension     Hypothyroidism     IBS (irritable bowel syndrome)     Polymyalgia rheumatica (HCC)           Past Surgical History:  Procedure Laterality Date   ABDOMINAL HYSTERECTOMY  APPENDECTOMY       BREAST BIOPSY Right     CATARACT EXTRACTION, BILATERAL Bilateral      cataracts   CHOLECYSTECTOMY N/A 05/09/2016    Procedure: LAPAROSCOPIC CHOLECYSTECTOMY WITH INTRAOPERATIVE CHOLANGIOGRAM;  Surgeon: Jackolyn Confer, MD;  Location: [facility];  Service: General;  Laterality: N/A;   LEFT COLECTOMY        removed about 18 in due to [place] Right 11/05/2022    Procedure: [place];  Surgeon: Gaynelle Arabian, MD;  Location: [facility];  Service: Orthopedics;  Laterality: Right;         Family History  Problem Relation Age of Onset   Cancer Mother          type unknown    Hypertension Mother     Heart disease Father     COPD Father     Hypertension Daughter     Heart disease Son     Heart attack Son     Breast cancer Neg Hx      Social History:  reports that she has never smoked. She has never used smokeless tobacco. She reports that she does not drink alcohol and does not use drugs. Allergies:       Allergies  Allergen Reactions   Amoxicillin-Pot Clavulanate Nausea Only   Erythromycin Nausea And Vomiting   Mushroom Extract Complex Other (See Comments)      Cantalope, adding enviromental allergens such as grass & trees Had allergy test   Latex Rash   Macrobid [Nitrofurantoin Monohyd Macro] Rash   Zetia [Ezetimibe] Rash          Medications Prior to Admission  Medication Sig Dispense Refill   ALPRAZolam (XANAX) 0.25 MG tablet Take 0.25 mg by mouth 2 (two) times daily as needed for anxiety.       levothyroxine (SYNTHROID) 75 MCG tablet Take 1 tablet by mouth once daily (Patient taking differently: Take 75 mcg by mouth daily before breakfast.) 90 tablet 0   lisinopril (ZESTRIL) 5 MG tablet Take 1 tablet by mouth once daily 90 tablet 3   Multiple Vitamin (MULTIVITAMIN) tablet Take 1 tablet by mouth daily.       Polyethyl Glycol-Propyl Glycol (SYSTANE ULTRA OP) Place 1 drop into both eyes 2 (two) times daily.       psyllium (METAMUCIL SMOOTH TEXTURE) 28 % packet Take 1 packet by mouth 2 (two) times daily.              Home: Home Living Family/patient expects to be discharged to:: Inpatient rehab Living Arrangements: Alone Available Help at Discharge: Family, Available 24 hours/day (dtr, son and niece can provide initial 24/7 supervision) Type of Home: House Home Access: Stairs to enter [employer] of Steps: 2 to 3 Entrance Stairs-Rails: None Home Layout: One level Bathroom Shower/Tub: Optometrist: Yes Home Equipment: None  Lives With: Alone   Functional History: Prior  Function Prior Level of Function : Independent/Modified Independent, Driving Mobility Comments: pt very active, independent at baseline   Functional Status:  Mobility: Bed Mobility Overal bed mobility: Needs Assistance Bed Mobility: Supine to Sit, Sit to Supine Supine to sit: Supervision Sit to supine: Supervision General bed mobility comments: incr time, cues to complete progression of RLE off and on bed without physical assist Transfers Overall transfer level: Needs assistance Equipment used: Rolling walker (2 wheels) Transfers: Sit to/from Stand Sit to Stand: Min guard General transfer comment: verbal cues  for hand placement and RLE Position, demonstrates good stability on ascent and descent Ambulation/Gait Ambulation/Gait assistance: Supervision, Min guard Gait Distance (Feet): 80 Feet Assistive device: Rolling walker (2 wheels) Gait Pattern/deviations: Step-to pattern, Step-through pattern, Decreased weight shift to right General Gait Details: verbal cues for RW position and safety with turns Gait velocity: decr   ADL: ADL Overall ADL's : Needs assistance/impaired Eating/Feeding: Set up, Sitting Grooming: Wash/dry face, Wash/dry hands, Standing, Min guard Grooming Details (indicate cue type and reason): at sink with RW Upper Body Bathing: Min guard, Sitting Lower Body Bathing: Maximal assistance, Sit to/from stand, Sitting/lateral leans Upper Body Dressing : Min guard, Sitting Lower Body Dressing: Sitting/lateral leans, Sit to/from stand, Maximal assistance Lower Body Dressing Details (indicate cue type and reason): simulated Toilet Transfer: Minimal assistance, Ambulation, Rolling walker (2 wheels) Toilet Transfer Details (indicate cue type and reason): to transfer from bed to commode in bathroom and back with cues for proper sequencing of task. Toileting- Clothing Manipulation and Hygiene: Minimal assistance, Sit to/from stand   Cognition: Cognition Overall Cognitive  Status: Within Functional Limits for tasks assessed Orientation Level: Oriented X4 Cognition Arousal/Alertness: Awake/alert Behavior During Therapy: WFL for tasks assessed/performed Overall Cognitive Status: Within Functional Limits for tasks assessed General Comments: pt c/o "raw" feeling top of R ankle; RN had placed mepilex at proximal border of R ankle ASO. pt also reporting she believes the grips on the slipper socks may be aggravating her latex allergy; socks and ASL removed, elevated R ankle +ice; some edema and incr area of brusing noted over lateral ankle and dorsum R foot   Physical Exam: Blood pressure (!) 132/54, pulse 80, temperature 98.7 F (37.1 C), temperature source Oral, resp. rate 18, height 5\' 2"  (1.575 m), weight 59 kg, SpO2 100 %. Physical Exam Vitals and nursing note reviewed.  Constitutional:      Appearance: Normal appearance. She is normal weight.     Comments: Awake, alert, appropriate, appears younger than 89 yrs old, NAD, HOH  HENT:     Head: Normocephalic and atraumatic.     Right Ear: External ear normal.     Left Ear: External ear normal.     Nose: Nose normal. No congestion.     Mouth/Throat:     Mouth: Mucous membranes are dry.     Pharynx: Oropharynx is clear. No oropharyngeal exudate.  Eyes:     General:        Right eye: No discharge.        Left eye: No discharge.     Extraocular Movements: Extraocular movements intact.  Cardiovascular:     Rate and Rhythm: Normal rate and regular rhythm.     Heart sounds: Normal heart sounds. No murmur heard.    No gallop.  Pulmonary:     Effort: Pulmonary effort is normal. No respiratory distress.     Breath sounds: Normal breath sounds. No wheezing, rhonchi or rales.  Abdominal:     General: There is distension.     Palpations: Abdomen is soft.     Tenderness: There is no abdominal tenderness.     Comments: Soft, but somewhat distended; hypoactive BS  Musculoskeletal:     Cervical back: Neck supple.  No tenderness.     Comments: UE strength 5-/5 in biceps, triceps, WE, grip and FA B/L RLE- HF 4/5; KE/KF 4/5 and DF 3-/5 and PF 4+/5 LLE- 5-/5 in same muscles Mild swelling of R hip/upper thigh and R ankle- wearing corset lace up ankle brace-  foam dressing under it-   Skin:    General: Skin is warm and dry.     Comments: Hip incision clean and dry with original surgical dressing in place.  Appropriately tender R ankle and anterior tibialis are bruised Raw spot on back of R ankle  Neurological:     Mental Status: She is alert and oriented to person, place, and time.     Comments: Patient is alert and oriented x 3 and follows commands. Intact to light touch in all 4 extremities  Psychiatric:        Mood and Affect: Mood normal.        Behavior: Behavior normal.     Comments: HOH        Lab Results Last 48 Hours        Results for orders placed or performed during the hospital encounter of 11/03/22 (from the past 48 hour(s))  CBC     Status: Abnormal    Collection Time: 11/08/22  3:41 AM  Result Value Ref Range    WBC 9.4 4.0 - 10.5 K/uL    RBC 2.69 (L) 3.87 - 5.11 MIL/uL    Hemoglobin 8.3 (L) 12.0 - 15.0 g/dL    HCT 16.1 (L) 09.6 - 46.0 %    MCV 98.5 80.0 - 100.0 fL    MCH 30.9 26.0 - 34.0 pg    MCHC 31.3 30.0 - 36.0 g/dL    RDW 04.5 40.9 - 81.1 %    Platelets 156 150 - 400 K/uL    nRBC 0.0 0.0 - 0.2 %      Comment: Performed at [facility], [address] [place], [place] [address]      Imaging Results (Last 48 hours)  No results found.         Blood pressure (!) 132/54, pulse 80, temperature 98.7 F (37.1 C), temperature source Oral, resp. rate 18, height 5\' 2"  (1.575 m), weight 59 kg, SpO2 100 %.   Medical Problem List and Plan: 1. Functional deficits secondary to right femoral neck fracture after mechanical fall.  Status post right total hip arthroplasty anterior approach 11/05/2022.  Weightbearing as tolerated             -patient may   shower-cover incision             -ELOS/Goals: 10-14 days- supervision to CGA 2.  Antithrombotics: -DVT/anticoagulation:  Mechanical: Antiembolism stockings, thigh (TED hose) Bilateral lower extremities.  Check vascular study             -antiplatelet therapy: Aspirin 325 mg twice daily x 18 days then begin aspirin 81 mg daily for 3 weeks then discontinue 3. Pain Management: Hydrocodone/tramadol as needed.  Robaxin as needed for muscle spasms- recommended to take tylenol or tramadol before therapy sessions.  4. Mood/Behavior/Sleep: Xanax 0.25 mg twice daily as needed .Provide emotional support             -antipsychotic agents: N/A 5. Neuropsych/cognition: This patient is capable of making decisions on her own behalf. 6. Skin/Wound Care: Routine skin checks 7. Fluids/Electrolytes/Nutrition: Routine in and outs with follow-up chemistries 8.  Right ankle avulsion fracture.  ASO ankle brace applied.  Weightbearing as tolerated 9.  Acute blood loss anemia.  Follow-up CBC 10.  Hypertension.  Low-dose lisinopril 5 mg daily 11.  Hypothyroidism.  Synthroid 12. Insomnia- will add Trazodone 25-50 mg QHS for sleep prn 13. Constipation- rare Bms- if no BM by tomorrow, will give Sorbitol and  soap suds enema if necessary to clean her out.  - also con't benefiber, 2-3x/day- her home dose.   14. Urinary urgency- hopefully trazodone and Anticholinergic effects help her with urinary urgency; if not, will need to check U/A and Cx and maybe if that's negative, use Myrbetriq?          Lavon Paganini Angiulli, PA-C 11/09/2022     I have personally performed a face to face diagnostic evaluation of this patient and formulated the key components of the plan.  Additionally, I have personally reviewed laboratory data, imaging studies, as well as relevant notes and concur with the physician assistant's documentation above.   The patient's status has not changed from the original H&P.  Any changes in documentation from the  acute care chart have been noted above.

## 2022-11-09 NOTE — Progress Notes (Signed)
Inpatient Rehabilitation Admission Medication Review by a Pharmacist  A complete drug regimen review was completed for this patient to identify any potential clinically significant medication issues.  High Risk Drug Classes Is patient taking? Indication by Medication  Antipsychotic No   Anticoagulant No   Antibiotic No   Opioid Yes Tramadol, norco-pain  Antiplatelet Yes ASA-DVT ppx  Hypoglycemics/insulin No   Vasoactive Medication Yes Lisinopril-HTN  Chemotherapy No   Other Yes Levothyroxine-hypothyroidism Xanax-anxiety Robaxin-muscle spasms Zofran-N/V Bisacodyl, Miralax, psyllium, docusate-constipation     Type of Medication Issue Identified Description of Issue Recommendation(s)  Drug Interaction(s) (clinically significant)     Duplicate Therapy     Allergy     No Medication Administration End Date  ASA 325mg  BID x18d followed by ASA daily for 21 days Add end dates  Incorrect Dose     Additional Drug Therapy Needed     Significant med changes from prior encounter (inform family/care partners about these prior to discharge).    Other       Clinically significant medication issues were identified that warrant physician communication and completion of prescribed/recommended actions by midnight of the next day:  No  Name of provider notified for urgent issues identified:   Provider Method of Notification:   Pharmacist comments:   Time spent performing this drug regimen review (minutes):  [place], PharmD. [facility] Acute Care PGY-1  11/09/2022 2:48 PM

## 2022-11-09 NOTE — Progress Notes (Signed)
Inpatient [facility] Individual Statement of Services  Patient Name:  Dominique Livingston  Date:  11/09/2022  Welcome to the [facility].  Our goal is to provide you with an individualized program based on your diagnosis and situation, designed to meet your specific needs.  With this comprehensive rehabilitation program, you will be expected to participate in at least 3 hours of rehabilitation therapies Monday-Friday, with modified therapy programming on the weekends.  Your rehabilitation program will include the following services:  Physical Therapy (PT), Occupational Therapy (OT), 24 hour per day rehabilitation nursing, Therapeutic Recreaction (TR), Care Coordinator, Rehabilitation Medicine, Nutrition Services, and Pharmacy Services  Weekly team conferences will be held on Tuesday to discuss your progress.  Your Inpatient Rehabilitation Care Coordinator will talk with you frequently to get your input and to update you on team discussions.  Team conferences with you and your family in attendance may also be held.  Expected length of stay: 7-10 days  Overall anticipated outcome: supervision-independent with cues  Depending on your progress and recovery, your program may change. Your Inpatient Rehabilitation Care Coordinator will coordinate services and will keep you informed of any changes. Your Inpatient Rehabilitation Care Coordinator's name and contact numbers are listed  below.  The following services may also be recommended but are not provided by the [facility] will be made to provide these services after discharge if needed.  Arrangements include referral to agencies that provide these services.  Your insurance has been verified to be:  [place] Your primary doctor is:  Kyra Manges Baxley  Pertinent information will be shared  with your doctor and your insurance company.  Inpatient Rehabilitation Care Coordinator:  Ovidio Kin, [place] or Emilia Beck  Information discussed with and copy given to patient by: Elease Hashimoto, 11/09/2022, 2:48 PM

## 2022-11-09 NOTE — Progress Notes (Signed)
Bilateral lower extremity venous duplex has been completed. Preliminary results can be found in CV Proc through chart review.   11/09/22 4:38 PM Dominique Livingston RVT

## 2022-11-09 NOTE — Progress Notes (Signed)
Inpatient Rehabilitation Care Coordinator Assessment and Plan Patient Details  Name: Dominique Livingston MRN: [id number] Date of Birth: Mar 27, 1933  Today's Date: 11/09/2022  Hospital Problems: Principal Problem:   Femoral neck fracture [facility])  Past Medical History:  Past Medical History:  Diagnosis Date   Allergy    Anxiety    Arthritis    hands , stiffness in knees & hips    Colon polyp, hyperplastic    Diverticulosis    Endometriosis    Fibrocystic breast disease    GERD (gastroesophageal reflux disease)    Hyperlipidemia    Hypertension    Hypothyroidism    IBS (irritable bowel syndrome)    Polymyalgia rheumatica (HCC)    Past Surgical History:  Past Surgical History:  Procedure Laterality Date   ABDOMINAL HYSTERECTOMY     APPENDECTOMY     BREAST BIOPSY Right    CATARACT EXTRACTION, BILATERAL Bilateral    cataracts   CHOLECYSTECTOMY N/A 05/09/2016   Procedure: LAPAROSCOPIC CHOLECYSTECTOMY WITH INTRAOPERATIVE CHOLANGIOGRAM;  Surgeon: Jackolyn Confer, MD;  Location: [facility];  Service: General;  Laterality: N/A;   LEFT COLECTOMY     removed about 18 in due to [place] Right 11/05/2022   Procedure: [place];  Surgeon: Gaynelle Arabian, MD;  Location: [facility];  Service: Orthopedics;  Laterality: Right;   Social History:  reports that she has never smoked. She has never used smokeless tobacco. She reports that she does not drink alcohol and does not use drugs.  Family / Support Systems Marital Status: Widow/Widower Patient Roles: Parent, Other (Comment) (grandmother) Children: karen-daughter [phone] Son who is local and supportive Other Supports: Niece Anticipated Caregiver: Two children and niece Ability/Limitations of Caregiver: daughter is retired, son works and niece close by Building control surveyor Availability: 24/7 Family Dynamics: Close with two children and extended family, she has friends and church members who will  check on her if needed  Social History Preferred language: English Religion: Protestant Cultural Background: No issues Education: [place] - How often do you need to have someone help you when you read instructions, pamphlets, or other written material from your doctor or pharmacy?: Never Writes: Yes Employment Status: Retired Public relations account executive Issues: No issues Guardian/Conservator: None-according to MD pt is capable of making her own decisions while here   Abuse/Neglect Abuse/Neglect Assessment Can Be Completed: Yes Physical Abuse: Denies Verbal Abuse: Denies Sexual Abuse: Denies Exploitation of patient/patient's resources: Denies Self-Neglect: Denies  Patient response to: Social Isolation - How often do you feel lonely or isolated from those around you?: Never  Emotional Status Pt's affect, behavior and adjustment status: Pt is motivated to recover and regain her independence. She has always been independent and able to do for herself all of her life. She is not used to this, depending upon others. Recent Psychosocial Issues: other health issues Psychiatric History: No history/issues Substance Abuse History: No issues  Patient / Family Perceptions, Expectations & Goals Pt/Family understanding of illness & functional limitations: Pt is able to explain her hip fracture and replacemnt. She does talk with the MD and feels she has a good understanding of her plan and hopes to do well here Premorbid pt/family roles/activities: Mom, retiree, grandmother, aunt, church member, etc Anticipated changes in roles/activities/participation: resume Pt/family expectations/goals: Pt states: " I hope to do well and be mobile when I leave here."  [employer]: None Premorbid Home Care/DME Agencies: None Transportation available at discharge: self and family Is  the patient able to respond to transportation needs?: Yes In the past 12 months, has  lack of transportation kept you from medical appointments or from getting medications?: No In the past 12 months, has lack of transportation kept you from meetings, work, or from getting things needed for daily living?: No  Discharge Planning Living Arrangements: Alone Support Systems: Children, Other relatives, Church/faith community Type of Residence: Private residence Insurance Resources: [employer], Multimedia programmer (specify) Nurse, mental health) Financial Resources: Radio broadcast assistant Screen Referred: No Living Expenses: Own Money Management: Patient Does the patient have any problems obtaining your medications?: No Home Management: self Patient/Family Preliminary Plans: Return home with family assisting if needed. Pt is hopeful she wil not need assist and can be somewhat self sufficent by discharge. Aware team conference is Tuesday and goals will be set tomorrow upoin evaluation Care Coordinator Anticipated Follow Up Needs: HH/OP  Clinical Impression Pleasant very independent lady who wants to get back to her home and self sufficency. Her two children are local and involved, daughter is retired. Will await team evaluations and work on discharge needs.  Elease Hashimoto 11/09/2022, 2:46 PM

## 2022-11-09 NOTE — Discharge Instructions (Addendum)
Inpatient Rehab Discharge Instructions  AKISHA SCHMIDTKE Discharge date and time: No discharge date for patient encounter.   Activities/Precautions/ Functional Status: Activity:  Weight Bearing as tolerated/Ankle brace Diet: regular diet Wound Care: Routine skin checks Functional status:  ___ No restrictions     ___ Walk up steps independently ___ 24/7 supervision/assistance   ___ Walk up steps with assistance ___ Intermittent supervision/assistance  ___ Bathe/dress independently ___ Walk with walker     _x__ Bathe/dress with assistance ___ Walk Independently    ___ Shower independently ___ Walk with assistance    ___ Shower with assistance ___ No alcohol     ___ Return to work/school ________  Special Instructions: No driving smoking or alcohol  Continue aspirin 325 mg twice daily x 2 more days then begin aspirin 81 mg daily x 3 weeks and stop  COMMUNITY REFERRALS UPON DISCHARGE:    Home Exercise Program given to patient to do at home  Medical Equipment/Items Ordered:3 in 1                                                 Agency/Supplier:[employer] [employer]  [phone]     My questions have been answered and I understand these instructions. I will adhere to these goals and the provided educational materials after my discharge from the hospital.  Patient/Caregiver Signature _______________________________ Date __________  Clinician Signature _______________________________________ Date __________  Please bring this form and your medication list with you to all your follow-up doctor's appointments.

## 2022-11-09 NOTE — Progress Notes (Signed)
Genice Rouge, MD  Physician Physical Medicine and Rehabilitation   PMR Pre-admission    Signed   Date of Service: 11/07/2022  4:00 PM  Related encounter: ED to Hosp-Admission (Discharged) from 11/03/2022 in [facility]   Signed      Show:Clear all [x] Written[x] Templated[x] Copied  Added by: [x] , RN[x] , MD  [] Hover for details PMR Admission Coordinator Pre-Admission Assessment   Patient: Dominique Livingston is an 89 y.o., female MRN: DOB: 09/16/1933 Height: 5\' 2"  (157.5 cm) Weight: 59 kg   Insurance Information HMO:     PPO:      PCP:      IPA:      80/20:      OTHER:  PRIMARY: Medicare a and b      Policy#: [id number]      Subscriber: pt Benefits:  Phone #: passport one source     Name: 2/6 Eff. Date: 01/31/2000     Deduct: $1632      Out of Pocket Max: none      Life Max: none CIR: 100%      SNF: 20 full days Outpatient: 80%     Co-Pay: 20% Home Health: 100%      Co-Pay: none DME: 80%     Co-Pay: 20% Providers: pt choice  SECONDARY: BCBS [place] supplement      Policy#:   Financial Counselor:       Phone#:    The "Data Collection Information Summary" for patients in Inpatient Rehabilitation Facilities with attached "Privacy Act Statement-Health Care Records" was provided and verbally reviewed with: Patient and Family   Emergency Contact Information Contact Information       Name Relation Home Work Mobile    Smiley S Daughter [phone]   [phone]         Current Medical History  Patient Admitting Diagnosis: Hip fx   History of Present Illness:   89 year old right-handed female with history of hypertension, endometriosis status post remote right hemicolectomy, IBS, hyperlipidemia, polymyalgia rheumatica.   Presented 11/03/2022 after mechanical fall at home after she tripped on the carpet.  Denied loss of consciousness.  Patient notes she was on the floor x 1 hour and was unable to reach  the phone.     Admission chemistries unremarkable except glucose 144, WBC 11,400, hemoglobin 11.1.  X-rays and imaging revealed right femoral neck fracture.  Underwent right total hip arthroplasty, anterior approach 11/05/2022 per Dr. [phone].  Patient is weightbearing as tolerated.  Patient also sustained right ankle avulsion fracture conservative care with ASO brace applied.  Hospital course acute blood loss anemia 8.3 and monitored.  Placed on aspirin 325 mg twice daily for DVT prophylaxis    Patient's medical record from [facility] has been reviewed by the rehabilitation admission coordinator and physician.   Past Medical History      Past Medical History:  Diagnosis Date   Allergy     Anxiety     Arthritis      hands , stiffness in knees & hips    Colon polyp, hyperplastic     Diverticulosis     Endometriosis     Fibrocystic breast disease     GERD (gastroesophageal reflux disease)     Hyperlipidemia     Hypertension     Hypothyroidism     IBS (irritable bowel syndrome)     Polymyalgia rheumatica (HCC)      Has the patient had major surgery during  100 days prior to admission? Yes   Family History   family history includes COPD in her father; Cancer in her mother; Heart attack in her son; Heart disease in her father and son; Hypertension in her daughter and mother.   Current Medications   Current Facility-Administered Medications:    acetaminophen (TYLENOL) tablet 325-650 mg, 325-650 mg, Oral, Q6H PRN, Jearld Lesch, PA, 650 mg at 11/09/22 1610   ALPRAZolam Duanne Moron) tablet 0.25 mg, 0.25 mg, Oral, BID PRN, Jearld Lesch, PA, 0.25 mg at 11/04/22 2114   aspirin EC tablet 325 mg, 325 mg, Oral, BID, Jearld Lesch, PA, 325 mg at 11/08/22 2121   bisacodyl (DULCOLAX) suppository 10 mg, 10 mg, Rectal, Daily PRN, Jearld Lesch, PA   diphenhydrAMINE (BENADRYL) 12.5 MG/5ML elixir 12.5-25 mg, 12.5-25 mg, Oral, Q4H PRN, Jearld Lesch, PA   docusate sodium  (COLACE) capsule 100 mg, 100 mg, Oral, BID, Jearld Lesch, PA, 100 mg at 11/08/22 2120   feeding supplement (ENSURE ENLIVE / ENSURE PLUS) liquid 237 mL, 237 mL, Oral, BID BM, Mariel Aloe, MD, 237 mL at 11/06/22 1514   HYDROcodone-acetaminophen (NORCO/VICODIN) 5-325 MG per tablet 1-2 tablet, 1-2 tablet, Oral, Q4H PRN, Jearld Lesch, PA   HYDROcodone-acetaminophen (NORCO/VICODIN) 5-325 MG per tablet 1-2 tablet, 1-2 tablet, Oral, Q4H PRN, Gaynelle Arabian, MD, 1 tablet at 11/05/22 1949   levothyroxine (SYNTHROID) tablet 75 mcg, 75 mcg, Oral, Daily, Jearld Lesch, PA, 75 mcg at 11/09/22 9604   menthol-cetylpyridinium (CEPACOL) lozenge 3 mg, 1 lozenge, Oral, PRN **OR** phenol (CHLORASEPTIC) mouth spray 1 spray, 1 spray, Mouth/Throat, PRN, Jearld Lesch, PA   [DISCONTINUED] methocarbamol (ROBAXIN) tablet 500 mg, 500 mg, Oral, Q6H PRN **OR** methocarbamol (ROBAXIN) 500 mg in dextrose 5 % 50 mL IVPB, 500 mg, Intravenous, Q6H PRN, Jearld Lesch, PA, Last Rate: 110 mL/hr at 11/05/22 1820, 500 mg at 11/05/22 1820   methocarbamol (ROBAXIN) tablet 500 mg, 500 mg, Oral, Q6H PRN, 500 mg at 11/07/22 0855 **OR** methocarbamol (ROBAXIN) 500 mg in dextrose 5 % 50 mL IVPB, 500 mg, Intravenous, Q6H PRN, Aluisio, Pilar Plate, MD   metoCLOPramide (REGLAN) tablet 5-10 mg, 5-10 mg, Oral, Q8H PRN **OR** metoCLOPramide (REGLAN) injection 5-10 mg, 5-10 mg, Intravenous, Q8H PRN, Jearld Lesch, PA   [DISCONTINUED] metoCLOPramide (REGLAN) tablet 5-10 mg, 5-10 mg, Oral, Q8H PRN **OR** metoCLOPramide (REGLAN) injection 5-10 mg, 5-10 mg, Intravenous, Q8H PRN, Aluisio, Pilar Plate, MD   morphine (PF) 2 MG/ML injection 0.5-1 mg, 0.5-1 mg, Intravenous, Q2H PRN, Jearld Lesch, PA   morphine (PF) 2 MG/ML injection 1 mg, 1 mg, Intravenous, Q2H PRN, Aluisio, Pilar Plate, MD   multivitamin with minerals tablet 1 tablet, 1 tablet, Oral, Daily, Jearld Lesch, PA, 1 tablet at 11/08/22 0838   ondansetron (ZOFRAN) tablet 4 mg, 4 mg,  Oral, Q6H PRN **OR** ondansetron (ZOFRAN) injection 4 mg, 4 mg, Intravenous, Q6H PRN, [place], Rebecca, PA   polyethylene glycol (MIRALAX / GLYCOLAX) packet 17 g, 17 g, Oral, Daily PRN, Jearld Lesch, PA, 17 g at 11/08/22 0839   polyvinyl alcohol (LIQUIFILM TEARS) 1.4 % ophthalmic solution 1 drop, 1 drop, Both Eyes, BID, [place], Rebecca, PA, 1 drop at 11/08/22 2121   psyllium (HYDROCIL/METAMUCIL) 1 packet, 1 packet, Oral, BID, Jearld Lesch, PA, 1 packet at 11/08/22 2119   sodium phosphate (FLEET) 7-19 GM/118ML enema 1 enema, 1 enema, Rectal, Once PRN, Jearld Lesch, PA   traMADol (ULTRAM) tablet 50-100 mg, 50-100 mg, Oral, Q6H PRN, Jearld Lesch, PA, 100 mg at 11/07/22 0856   Patients Current Diet:  Diet Order                  Diet regular Room service appropriate? Yes; Fluid consistency: Thin  Diet effective now                         Precautions / Restrictions Precautions Precautions: Fall Other Brace: R ankle ASO Restrictions Weight Bearing Restrictions: No LLE Weight Bearing: Weight bearing as tolerated Other Position/Activity Restrictions: WBAT    Has the patient had 2 or more falls or a fall with injury in the past year? Yes   Prior Activity Level Community (5-7x/wk): Independent without AD,  driving and active   Prior Functional Level Self Care: Did the patient need help bathing, dressing, using the toilet or eating? Independent   Indoor Mobility: Did the patient need assistance with walking from room to room (with or without device)? Independent   Stairs: Did the patient need assistance with internal or external stairs (with or without device)? Independent   Functional Cognition: Did the patient need help planning regular tasks such as shopping or remembering to take medications? Independent   Patient Information Are you of Hispanic, Latino/a,or Spanish origin?: A. No, not of Hispanic, Latino/a, or Spanish origin What is your race?: A.  White Do you need or want an interpreter to communicate with a doctor or health care staff?: 0. No   Patient's Response To:  Health Literacy and Transportation Is the patient able to respond to health literacy and transportation needs?: Yes Health Literacy - How often do you need to have someone help you when you read instructions, pamphlets, or other written material from your doctor or pharmacy?: Never In the past 12 months, has lack of transportation kept you from medical appointments or from getting medications?: No In the past 12 months, has lack of transportation kept you from meetings, work, or from getting things needed for daily living?: No   Development worker, international aid / [place] Devices/Equipment: None Home Equipment: None   Prior Device Use: Indicate devices/aids used by the patient prior to current illness, exacerbation or injury? None of the above   Current Functional Level Cognition   Overall Cognitive Status: Within Functional Limits for tasks assessed Orientation Level: Oriented X4 General Comments: pt c/o "raw" feeling top of R ankle; RN had placed mepilex at proximal border of R ankle ASO. pt also reporting she believes the grips on the slipper socks may be aggravating her latex allergy; socks and ASL removed, elevated R ankle +ice; some edema and incr area of brusing noted over lateral ankle and dorsum R foot    Extremity Assessment (includes Sensation/Coordination)   Upper Extremity Assessment: Overall WFL for tasks assessed  Lower Extremity Assessment: Defer to PT evaluation RLE Deficits / Details: AAROM WFL, knee and hip 2+/5; ankle AROM grossly WFL per obs, NT further     ADLs   Overall ADL's : Needs assistance/impaired Eating/Feeding: Set up, Sitting Grooming: Wash/dry face, Wash/dry hands, Standing, Min guard Grooming Details (indicate cue type and reason): at sink with RW Upper Body Bathing: Min guard, Sitting Lower Body Bathing: Maximal  assistance, Sit to/from stand, Sitting/lateral leans Upper Body Dressing : Min guard, Sitting Lower Body Dressing: Sitting/lateral leans, Sit to/from stand, Maximal assistance Lower Body Dressing Details (indicate cue type and reason): simulated Toilet Transfer: Minimal assistance, Ambulation, Rolling walker (2 wheels) Toilet Transfer Details (indicate cue type and reason): to transfer from bed to commode in bathroom  and back with cues for proper sequencing of task. Toileting- Clothing Manipulation and Hygiene: Minimal assistance, Sit to/from stand     Mobility   Overal bed mobility: Needs Assistance Bed Mobility: Supine to Sit, Sit to Supine Supine to sit: Supervision Sit to supine: Supervision General bed mobility comments: incr time, cues to complete progression of RLE off and on bed without physical assist     Transfers   Overall transfer level: Needs assistance Equipment used: Rolling walker (2 wheels) Transfers: Sit to/from Stand Sit to Stand: Min guard General transfer comment: verbal cues for hand placement and RLE Position, demonstrates good stability on ascent and descent     Ambulation / Gait / Stairs / Emergency planning/management officer   Ambulation/Gait Ambulation/Gait assistance: Supervision, Min guard Gait Distance (Feet): 80 Feet Assistive device: Rolling walker (2 wheels) Gait Pattern/deviations: Step-to pattern, Step-through pattern, Decreased weight shift to right General Gait Details: verbal cues for RW position and safety with turns Gait velocity: decr     Posture / Balance Balance Overall balance assessment: Needs assistance Sitting-balance support: Feet supported, No upper extremity supported Sitting balance-Leahy Scale: Good Standing balance support: Reliant on assistive device for balance, During functional activity Standing balance-Leahy Scale: Fair     Special needs/care consideration Fall precautions    Previous Home Environment  Living Arrangements: Alone   Lives With: Alone Available Help at Discharge: Family, Available 24 hours/day (dtr, son and niece can provide initial 24/7 supervision) Type of Home: House Home Layout: One level Home Access: Stairs to enter Entrance Stairs-Rails: None Entrance Stairs-Number of Steps: 2 to 3 Bathroom Shower/Tub: Optometrist: Yes How Accessible: Accessible via walker Joffre: No   Discharge Living Setting Plans for Discharge Living Setting: Patient's home, Alone, House Type of Home at Discharge: House Discharge Home Layout: One level Discharge Home Access: Stairs to enter Entrance Stairs-Rails: None Entrance Stairs-Number of Steps: 2 to 3 Discharge Bathroom Shower/Tub: Tub/shower unit Discharge Bathroom Toilet: Standard Discharge Bathroom Accessibility: Yes How Accessible: Accessible via walker Does the patient have any problems obtaining your medications?: No   Social/Family/Support Systems Patient Roles: Parent Contact Information: daughter, Santiago Glad Anticipated Caregiver: daughter, son and niece Anticipated Caregiver's Contact Information: see contacts Ability/Limitations of Caregiver: daughter retired, son works, niece close by Building control surveyor Availability: 24/7 Discharge Plan Discussed with Primary Caregiver: Yes Is Caregiver In Agreement with Plan?: Yes Does Caregiver/Family have Issues with Lodging/Transportation while Pt is in Rehab?: No   Goals Patient/Family Goal for Rehab: supervision with PT and OT Expected length of stay: [place] 7 to 10 days Pt/Family Agrees to Admission and willing to participate: Yes Program Orientation Provided & Reviewed with Pt/Caregiver Including Roles  & Responsibilities: Yes   Decrease burden of Care through IP rehab admission: n/a   Possible need for SNF placement upon discharge: not anticipated   Patient Condition: I have reviewed medical records from [facility], spoke with patient and  daughter. I discussed via phone for inpatient rehabilitation assessment.  Patient will benefit from ongoing PT and OT, can actively participate in 3 hours of therapy a day 5 days of the week, and can make measurable gains during the admission.  Patient will also benefit from the coordinated team approach during an Inpatient Acute Rehabilitation admission.  The patient will receive intensive therapy as well as Rehabilitation physician, nursing, social worker, and care management interventions.  Due to bladder management, bowel management, safety, skin/wound care, disease management, medication administration, pain management, and patient  education the patient requires 24 hour a day rehabilitation nursing.  The patient is currently min assist overall with mobility and basic ADLs.  Discharge setting and therapy post discharge at home with home health is anticipated.  Patient has agreed to participate in the Acute Inpatient Rehabilitation Program and will admit today.   Preadmission Screen Completed By:  Clois Dupes, 11/09/2022 10:07 AM ______________________________________________________________________   Discussed status with Dr. Berline Chough on 11/09/22 at 1006 and received approval for admission today.   Admission Coordinator:  Clois Dupes, RN, time 1006 Date 11/09/22    Assessment/Plan: Diagnosis: Does the need for close, 24 hr/day Medical supervision in concert with the patient's rehab needs make it unreasonable for this patient to be served in a less intensive setting? Yes Co-Morbidities requiring supervision/potential complications: Polymyalgia rheumatica, R ankle avulsion fx and R femoral neck fx, WBAT Due to bladder management, bowel management, safety, skin/wound care, disease management, medication administration, pain management, and patient education, does the patient require 24 hr/day rehab nursing? Yes Does the patient require coordinated care of a physician, rehab nurse, PT,  OT, and SLP to address physical and functional deficits in the context of the above medical diagnosis(es)? Yes Addressing deficits in the following areas: balance, endurance, locomotion, strength, transferring, bowel/bladder control, bathing, dressing, feeding, grooming, and toileting Can the patient actively participate in an intensive therapy program of at least 3 hrs of therapy 5 days a week? Yes The potential for patient to make measurable gains while on inpatient rehab is good Anticipated functional outcomes upon discharge from inpatient rehab: supervision PT, supervision OT, n/a SLP Estimated rehab length of stay to reach the above functional goals is: 7-10 days Anticipated discharge destination: Home 10. Overall Rehab/Functional Prognosis: good     MD Signature:           Revision History

## 2022-11-10 DIAGNOSIS — S82891D Other fracture of right lower leg, subsequent encounter for closed fracture with routine healing: Secondary | ICD-10-CM | POA: Diagnosis not present

## 2022-11-10 DIAGNOSIS — S72001D Fracture of unspecified part of neck of right femur, subsequent encounter for closed fracture with routine healing: Secondary | ICD-10-CM | POA: Diagnosis not present

## 2022-11-10 LAB — COMPREHENSIVE METABOLIC PANEL
ALT: 20 U/L (ref 0–44)
AST: 23 U/L (ref 15–41)
Albumin: 2.6 g/dL — ABNORMAL LOW (ref 3.5–5.0)
Alkaline Phosphatase: 36 U/L — ABNORMAL LOW (ref 38–126)
Anion gap: 10 (ref 5–15)
BUN: 23 mg/dL (ref 8–23)
CO2: 27 mmol/L (ref 22–32)
Calcium: 8.6 mg/dL — ABNORMAL LOW (ref 8.9–10.3)
Chloride: 99 mmol/L (ref 98–111)
Creatinine, Ser: 0.86 mg/dL (ref 0.44–1.00)
GFR, Estimated: >60 mL/min (ref >60)
Glucose, Bld: 104 mg/dL — ABNORMAL HIGH (ref 70–99)
Potassium: 4.1 mmol/L (ref 3.5–5.1)
Sodium: 136 mmol/L (ref 135–145)
Total Bilirubin: 0.4 mg/dL (ref 0.3–1.2)
Total Protein: 5.7 g/dL — ABNORMAL LOW (ref 6.5–8.1)

## 2022-11-10 LAB — CBC WITH DIFFERENTIAL/PLATELET
Abs Immature Granulocytes: 0.16 10*3/uL — ABNORMAL HIGH (ref 0.00–0.07)
Basophils Absolute: 0.1 10*3/uL (ref 0.0–0.1)
Basophils Relative: 1 %
Eosinophils Absolute: 0.3 10*3/uL (ref 0.0–0.5)
Eosinophils Relative: 3 %
HCT: 26 % — ABNORMAL LOW (ref 36.0–46.0)
Hemoglobin: 8.3 g/dL — ABNORMAL LOW (ref 12.0–15.0)
Immature Granulocytes: 2 %
Lymphocytes Relative: 27 %
Lymphs Abs: 2.2 10*3/uL (ref 0.7–4.0)
MCH: 30.6 pg (ref 26.0–34.0)
MCHC: 31.9 g/dL (ref 30.0–36.0)
MCV: 95.9 fL (ref 80.0–100.0)
Monocytes Absolute: 0.6 10*3/uL (ref 0.1–1.0)
Monocytes Relative: 7 %
Neutro Abs: 5.1 10*3/uL (ref 1.7–7.7)
Neutrophils Relative %: 60 %
Platelets: 228 10*3/uL (ref 150–400)
RBC: 2.71 MIL/uL — ABNORMAL LOW (ref 3.87–5.11)
RDW: 13.3 % (ref 11.5–15.5)
WBC: 8.4 10*3/uL (ref 4.0–10.5)
nRBC: 0 % (ref 0.0–0.2)

## 2022-11-10 MED ORDER — SORBITOL 70 % SOLN
30.0000 mL | Freq: Once | Status: AC
  Administered 2022-11-10: 30 mL via ORAL
  Filled 2022-11-10: qty 30

## 2022-11-10 NOTE — Progress Notes (Signed)
PROGRESS NOTE   Subjective/Complaints:  Pt reports doing pretty good- slept better with Trazodone.  Ate 75% of breakfast.  Needs to go to bathroom- reminded pt to ask for tylenol this AM before therapy (also informed nurse) and also ask for tramadol at lunch if tylenol not enough for therapy.   LBM 2 days ago- formed not hard but very small amounts- d/w pt using Sorbitol after therapy today- she's agreeable.     ROS:  Pt denies SOB, abd pain, CP, N/V/C/D, and vision changes Except for HPI   Objective:   VAS [place] LOWER EXTREMITY VENOUS (DVT)  Result Date: 11/09/2022  Lower Venous DVT Study Patient Name:  Dominique Livingston  Date of Exam:   11/09/2022 Medical Rec #: [id number]         Accession #:    [id number] Date of Birth: 04-30-1933         Patient Gender: F Patient Age:   89 years Exam Location:  [facility] Procedure:      VAS [place] LOWER EXTREMITY VENOUS (DVT) Referring Phys: Lauraine Rinne --------------------------------------------------------------------------------  Indications: Swelling.  Risk Factors: Surgery Trauma. Comparison Study: No prior studies. Performing Technologist: Oliver Hum RVT  Examination Guidelines: A complete evaluation includes B-mode imaging, spectral Doppler, color Doppler, and power Doppler as needed of all accessible portions of each vessel. Bilateral testing is considered an integral part of a complete examination. Limited examinations for reoccurring indications may be performed as noted. The reflux portion of the exam is performed with the patient in reverse Trendelenburg.  +---------+---------------+---------+-----------+----------+--------------+ RIGHT    CompressibilityPhasicitySpontaneityPropertiesThrombus Aging +---------+---------------+---------+-----------+----------+--------------+ CFV      Full           Yes      Yes                                  +---------+---------------+---------+-----------+----------+--------------+ SFJ      Full                                                        +---------+---------------+---------+-----------+----------+--------------+ FV Prox  Full                                                        +---------+---------------+---------+-----------+----------+--------------+ FV Mid   Full                                                        +---------+---------------+---------+-----------+----------+--------------+ FV DistalFull                                                        +---------+---------------+---------+-----------+----------+--------------+  PFV      Full                                                        +---------+---------------+---------+-----------+----------+--------------+ POP      Full           Yes      Yes                                 +---------+---------------+---------+-----------+----------+--------------+ PTV      Full                                                        +---------+---------------+---------+-----------+----------+--------------+ PERO     Full                                                        +---------+---------------+---------+-----------+----------+--------------+   +---------+---------------+---------+-----------+----------+--------------+ LEFT     CompressibilityPhasicitySpontaneityPropertiesThrombus Aging +---------+---------------+---------+-----------+----------+--------------+ CFV      Full           Yes      Yes                                 +---------+---------------+---------+-----------+----------+--------------+ SFJ      Full                                                        +---------+---------------+---------+-----------+----------+--------------+ FV Prox  Full                                                         +---------+---------------+---------+-----------+----------+--------------+ FV Mid   Full                                                        +---------+---------------+---------+-----------+----------+--------------+ FV DistalFull                                                        +---------+---------------+---------+-----------+----------+--------------+ PFV      Full                                                        +---------+---------------+---------+-----------+----------+--------------+  POP      Full           Yes      Yes                                 +---------+---------------+---------+-----------+----------+--------------+ PTV      Full                                                        +---------+---------------+---------+-----------+----------+--------------+ PERO     Full                                                        +---------+---------------+---------+-----------+----------+--------------+    Summary: RIGHT: - There is no evidence of deep vein thrombosis in the lower extremity.  - No cystic structure found in the popliteal fossa.  LEFT: - There is no evidence of deep vein thrombosis in the lower extremity.  - No cystic structure found in the popliteal fossa.  *See table(s) above for measurements and observations.    Preliminary    Recent Labs    11/08/22 0341 11/10/22 0523  WBC 9.4 8.4  HGB 8.3* 8.3*  HCT 26.5* 26.0*  PLT 156 228   Recent Labs    11/10/22 0523  NA 136  K 4.1  CL 99  CO2 27  GLUCOSE 104*  BUN 23  CREATININE 0.86  CALCIUM 8.6*    Intake/Output Summary (Last 24 hours) at 11/10/2022 2355 Last data filed at 11/10/2022 0615 Gross per 24 hour  Intake 720 ml  Output --  Net 720 ml        Physical Exam: Vital Signs Blood pressure (!) 109/50, pulse 72, temperature 98 F (36.7 C), temperature source Oral, resp. rate 17, height 5\' 2"  (1.575 m), weight 60.2 kg, SpO2 95 %.    General: awake,  alert, appropriate, sitting up in bed; NAD HENT: conjugate gaze; oropharynx moist CV: regular rate and rhythm; no JVD Pulmonary: CTA B/L; no W/R/R- good air movement GI: soft, NT, protuberant vs somewhat distended; hypoactive BS Psychiatric: appropriate- HOH but bright interactive affect Neurological: Ox3 Musculoskeletal:     Cervical back: Neck supple. No tenderness.     Comments: UE strength 5-/5 in biceps, triceps, WE, grip and FA B/L RLE- HF 4/5; KE/KF 4/5 and DF 3-/5 and PF 4+/5 LLE- 5-/5 in same muscles Mild swelling of R hip/upper thigh and R ankle- wearing corset lace up ankle brace- foam dressing under it-   Skin:    General: Skin is warm and dry.     Comments: Hip incision clean and dry with original surgical dressing in place.  Appropriately tender R ankle and anterior tibialis are bruised Raw spot on back of R ankle  Neurological:     Mental Status: She is alert and oriented to person, place, and time.     Comments: Patient is alert and oriented x 3 and follows commands. Intact to light touch in all 4 extremities  Assessment/Plan: 1. Functional deficits which require 3+ hours per day of interdisciplinary therapy in a comprehensive inpatient rehab setting. Physiatrist is  providing close team supervision and 24 hour management of active medical problems listed below. Physiatrist and rehab team continue to assess barriers to discharge/monitor patient progress toward functional and medical goals  Care Tool:  Bathing              Bathing assist       Upper Body Dressing/Undressing Upper body dressing        Upper body assist      Lower Body Dressing/Undressing Lower body dressing            Lower body assist       Toileting Toileting    Toileting assist       Transfers Chair/bed transfer  Transfers assist           Locomotion Ambulation   Ambulation assist              Walk 10 feet activity   Assist           Walk 50  feet activity   Assist           Walk 150 feet activity   Assist           Walk 10 feet on uneven surface  activity   Assist           Wheelchair     Assist               Wheelchair 50 feet with 2 turns activity    Assist            Wheelchair 150 feet activity     Assist          Blood pressure (!) 109/50, pulse 72, temperature 98 F (36.7 C), temperature source Oral, resp. rate 17, height 5\' 2"  (1.575 m), weight 60.2 kg, SpO2 95 %.  Medical Problem List and Plan: 1. Functional deficits secondary to right femoral neck fracture after mechanical fall.  Status post right total hip arthroplasty anterior approach 11/05/2022.  Weightbearing as tolerated             -patient may  shower-cover incision             -ELOS/Goals: 10-14 days- supervision to CGA  First day of evaluations- Con't CIR PT and OT 2.  Antithrombotics: -DVT/anticoagulation:  Mechanical: Antiembolism stockings, thigh (TED hose) Bilateral lower extremities.  Check vascular study             -antiplatelet therapy: Aspirin 325 mg twice daily x 18 days then begin aspirin 81 mg daily for 3 weeks then discontinue 3. Pain Management: Hydrocodone/tramadol as needed.  Robaxin as needed for muscle spasms- recommended to take tylenol or tramadol before therapy sessions.   2/9- asked pt to take tylenol before therapy- can ask for tramadol if tylenol "not enough". Let nursing know 4. Mood/Behavior/Sleep: Xanax 0.25 mg twice daily as needed .Provide emotional support             -antipsychotic agents: N/A 5. Neuropsych/cognition: This patient is capable of making decisions on her own behalf. 6. Skin/Wound Care: Routine skin checks 7. Fluids/Electrolytes/Nutrition: Routine in and outs with follow-up chemistries  2/9- labs look good 8.  Right ankle avulsion fracture.  ASO ankle brace applied.  Weightbearing as tolerated 9.  Acute blood loss anemia.  Follow-up CBC  2/9- Hb 8.3- stable-  con't to monitor 10.  Hypertension.  Low-dose lisinopril 5 mg daily 11.  Hypothyroidism.  Synthroid 12. Insomnia- will add Trazodone 25-50 mg QHS for  sleep prn  2/9- slept somewhat better- con't regimen 13. Constipation- rare Bms- if no BM by tomorrow, will give Sorbitol and soap suds enema if necessary to clean her out.  - also con't benefiber, 2-3x/day- her home dose.   2/9- will give Sorbitol after therapy today 30 cc to get her cleaned out. If need be, add soap suds enema.   14. Urinary urgency- hopefully trazodone and Anticholinergic effects help her with urinary urgency; if not, will need to check U/A and Cx and maybe if that's negative, use Myrbetriq?  2/9- doing slightly better- wait on U/A etc for now   I spent a total of 37   minutes on total care today- >50% coordination of care- due to  D/w nursing about Sorbitol and pain relief and need for bathroom.           LOS: 1 days A FACE TO FACE EVALUATION WAS PERFORMED  Kiyomi Pallo 11/10/2022, 8:33 AM

## 2022-11-10 NOTE — Evaluation (Signed)
Physical Therapy Assessment and Plan  Patient Details  Name: Dominique Livingston MRN: [id number] Date of Birth: 04/06/1933  PT Diagnosis: Abnormality of gait, Difficulty walking, Edema, Muscle weakness, Pain in joint, and Pain in R hip and R ankle Rehab Potential: Good ELOS: 7-9 days   Today's Date: 11/10/2022 PT Individual Time: 1021-1119 PT Individual Time Calculation (min): 62 min    Hospital Problem: Principal Problem:   Femoral neck fracture ([place]) Active Problems:   Essential hypertension   History of polymyalgia rheumatica   Avulsion fracture of right ankle   Past Medical History:  Past Medical History:  Diagnosis Date   Allergy    Anxiety    Arthritis    hands , stiffness in knees & hips    Colon polyp, hyperplastic    Diverticulosis    Endometriosis    Fibrocystic breast disease    GERD (gastroesophageal reflux disease)    Hyperlipidemia    Hypertension    Hypothyroidism    IBS (irritable bowel syndrome)    Polymyalgia rheumatica ([place])    Past Surgical History:  Past Surgical History:  Procedure Laterality Date   ABDOMINAL HYSTERECTOMY     APPENDECTOMY     BREAST BIOPSY Right    CATARACT EXTRACTION, BILATERAL Bilateral    cataracts   CHOLECYSTECTOMY N/A 05/09/2016   Procedure: LAPAROSCOPIC CHOLECYSTECTOMY WITH INTRAOPERATIVE CHOLANGIOGRAM;  Surgeon: Jackolyn Confer, MD;  Location: [facility];  Service: General;  Laterality: N/A;   LEFT COLECTOMY     removed about 18 in due to [place] Right 11/05/2022   Procedure: [place];  Surgeon: Gaynelle Arabian, MD;  Location: [facility];  Service: Orthopedics;  Laterality: Right;    Assessment & Plan Clinical Impression: Patient is a 89 y.o. year old right-handed female with history of hypertension, endometriosis status post remote right hemicolectomy, IBS, hyperlipidemia, polymyalgia rheumatica.  Per chart review patient lives alone.  Independent and active prior to  admission.  Presented 11/03/2022 after mechanical fall at home after she tripped on the carpet.  Denied loss of consciousness.  Patient notes she was on the floor x 1 hour and was unable to reach the phone.  Admission chemistries unremarkable except glucose 144, WBC 11,400, hemoglobin 11.1.  X-rays and imaging revealed right femoral neck fracture.  Underwent right total hip arthroplasty, anterior approach 11/05/2022 per Dr. Maureen Ralphs.  Patient is weightbearing as tolerated.  Patient also sustained right ankle avulsion fracture conservative care with ASO brace applied.  Hospital course acute blood loss anemia 8.3 and monitored.  Presently on aspirin 325 mg twice daily for DVT prophylaxis.  Blood pressure soft maintained on low-dose lisinopril.  Therapy evaluations completed due to patient decreased functional mobility was admitted for a comprehensive rehab program. .  Patient transferred to CIR on 11/09/2022 .   Patient currently requires min with mobility secondary to muscle weakness, decreased cardiorespiratoy endurance, and decreased standing balance.  Prior to hospitalization, patient was independent  with mobility and lived with Alone in a House home.  Home access is 3Stairs to enter (Stairs in back deck (3)).  Patient will benefit from skilled PT intervention to maximize safe functional mobility, minimize fall risk, and decrease caregiver burden for planned discharge home with intermittent assist.  Anticipate patient will benefit from follow up OP at discharge.  PT - End of Session Activity Tolerance: Tolerates 30+ min activity with multiple rests Endurance Deficit: Yes Endurance Deficit Description: Deficits due to discomfort in R hip/ R  ankle PT Assessment Rehab Potential (ACUTE/IP ONLY): Good PT Barriers to Discharge: Decreased caregiver support;Inaccessible home environment;Lack of/limited family support PT Patient demonstrates impairments in the following area(s): Balance;Endurance;Edema;Motor;Pain PT  Transfers Functional Problem(s): Floor;Car;Bed to Chair PT Locomotion Functional Problem(s): Ambulation;Stairs PT Plan PT Intensity: Minimum of 1-2 x/day ,45 to 90 minutes PT Frequency: 5 out of 7 days PT Duration Estimated Length of Stay: 7-9 days PT Treatment/Interventions: Ambulation/gait training;Community reintegration;DME/adaptive equipment instruction;Neuromuscular re-education;Stair training;UE/LE Strength taining/ROM;Wheelchair propulsion/positioning;Balance/vestibular training;Discharge planning;Functional electrical stimulation;Pain management;Therapeutic Activities;UE/LE Coordination activities;Functional mobility training;Patient/family education;Therapeutic Exercise;Splinting/orthotics PT Transfers Anticipated Outcome(s): Mod I PT Locomotion Anticipated Outcome(s): Mod I PT Recommendation Recommendations for Other Services: Therapeutic Recreation consult Therapeutic Recreation Interventions: Pet therapy;Kitchen group;Stress management;Outing/community reintergration Follow Up Recommendations: Outpatient PT Patient destination: Home Equipment Recommended: To be determined   PT Evaluation Precautions/Restrictions Precautions Precautions: Fall Required Braces or Orthoses: Other Brace Other Brace: R ankle ASO Restrictions Weight Bearing Restrictions: Yes RLE Weight Bearing: Weight bearing as tolerated LLE Weight Bearing: Weight bearing as tolerated Other Position/Activity Restrictions: WBAT General   Vital Signs Pain Pain Assessment Pain Scale: 0-10 Pain Score: 3  Pain Type: Acute pain Pain Location: Hip Pain Orientation: Right Pain Descriptors / Indicators: Aching;Discomfort Pain Frequency: Intermittent Pain Onset: With Activity Patients Stated Pain Goal: 1 Pain Intervention(s): Medication (See eMAR) Multiple Pain Sites: No Pain Interference Pain Interference Pain Effect on Sleep: 1. Rarely or not at all Pain Interference with Therapy Activities: 1. Rarely or  not at all Pain Interference with Day-to-Day Activities: 2. Occasionally Home Living/Prior Functioning Home Living Available Help at Discharge: Family Type of Home: House Home Access: Stairs to enter (Stairs in back deck (3)) Technical brewer of Steps: 3 Entrance Stairs-Rails: Right Home Layout: One level Bathroom Shower/Tub: Chiropodist: Standard Bathroom Accessibility: Yes  Lives With: Alone Prior Function Level of Independence: Independent with basic ADLs;Independent with gait;Independent with homemaking with ambulation;Independent with transfers  Able to Take Stairs?: Yes Driving: Yes Vocation: Retired Vision/Perception  Vision - History Ability to See in Adequate Light: 0 Adequate Perception Perception: Within Functional Limits Praxis Praxis: Intact  Cognition Overall Cognitive Status: Within Functional Limits for tasks assessed Arousal/Alertness: Awake/alert Orientation Level: Oriented X4 Memory: Appears intact Awareness: Appears intact Problem Solving: Appears intact Safety/Judgment: Appears intact Sensation Sensation Light Touch: Appears Intact Proprioception: Appears Intact Coordination Gross Motor Movements are Fluid and Coordinated: No Coordination and Movement Description: Antalgic gait Motor  Motor Motor: Other (comment) Motor - Skilled Clinical Observations: Antalgic gait   Trunk/Postural Assessment  Cervical Assessment Cervical Assessment: Within Functional Limits Thoracic Assessment Thoracic Assessment: Within Functional Limits Lumbar Assessment Lumbar Assessment: Within Functional Limits Postural Control Postural Control: Within Functional Limits  Balance Balance Balance Assessed: Yes Standardized Balance Assessment Standardized Balance Assessment: Timed Up and Go Test Timed Up and Go Test TUG: Normal TUG Normal TUG (seconds): 20.49 Static Sitting Balance Static Sitting - Balance Support: Feet supported Static  Sitting - Level of Assistance: 6: Modified independent (Device/Increase time) Static Standing Balance Static Standing - Balance Support: During functional activity;Bilateral upper extremity supported Static Standing - Level of Assistance: 5: Stand by assistance Dynamic Standing Balance Dynamic Standing - Balance Support: During functional activity Dynamic Standing - Level of Assistance: 5: Stand by assistance Extremity Assessment  RUE Assessment RUE Assessment: Within Functional Limits LUE Assessment LUE Assessment: Within Functional Limits RLE Assessment RLE Assessment: Exceptions to [facility] RLE Strength RLE Overall Strength: Deficits Right Hip Flexion: 3-/5 Right Hip ABduction: 3+/5 Right Hip ADduction: 4/5 Right Knee Flexion:  4/5 Right Knee Extension: 4/5 Right Ankle Dorsiflexion:  (Unable to test due to Ankle Brace on) LLE Assessment LLE Assessment: Exceptions to [facility] LLE Strength LLE Overall Strength: Deficits Left Hip Flexion: 4-/5 Left Hip ABduction: 4-/5 Left Hip ADduction: 4/5 Left Knee Flexion: 4/5 Left Knee Extension: 4/5 Left Ankle Dorsiflexion: 4-/5  Care Tool Care Tool Bed Mobility Roll left and right activity   Roll left and right assist level: Supervision/Verbal cueing    Sit to lying activity   Sit to lying assist level: Supervision/Verbal cueing    Lying to sitting on side of bed activity   Lying to sitting on side of bed assist level: the ability to move from lying on the back to sitting on the side of the bed with no back support.: Supervision/Verbal cueing     Care Tool Transfers Sit to stand transfer   Sit to stand assist level: Minimal Assistance - Patient > 75%    Chair/bed transfer   Chair/bed transfer assist level: Minimal Assistance - Patient > 75%     Toilet transfer   Assist Level: Minimal Assistance - Patient > 75%    Car transfer   Car transfer assist level: Contact Guard/Touching assist      Care Tool Locomotion Ambulation    Assist level: Contact Guard/Touching assist Assistive device: Walker-rolling  83f  Walk 10 feet activity   Assist level: Contact Guard/Touching assist Assistive device: Walker-rolling   Walk 50 feet with 2 turns activity   Assist level: Contact Guard/Touching assist Assistive device: Walker-rolling  Walk 150 feet activity   Assist level: Contact Guard/Touching assist Assistive device: Walker-rolling  Walk 10 feet on uneven surfaces activity   Assist level: Contact Guard/Touching assist Assistive device: Walker-rolling  Stairs   Assist level: Contact Guard/Touching assist Stairs assistive device: 2 hand rails Max number of stairs: 12  Walk up/down 1 step activity   Walk up/down 1 step (curb) assist level: Contact Guard/Touching assist Walk up/down 1 step or curb assistive device: 2 hand rails  Walk up/down 4 steps activity   Walk up/down 4 steps assist level: Contact Guard/Touching assist Walk up/down 4 steps assistive device: 2 hand rails  Walk up/down 12 steps activity   Walk up/down 12 steps assist level: Contact Guard/Touching assist Walk up/down 12 steps assistive device: 2 hand rails  Pick up small objects from floor   Pick up small object from the floor assist level: Minimal Assistance - Patient > 75%    Wheelchair Is the patient using a wheelchair?: Yes Type of Wheelchair: Manual   Wheelchair assist level: Dependent - Patient 0%    Wheel 50 feet with 2 turns activity   Assist Level: Dependent - Patient 0%  Wheel 150 feet activity   Assist Level: Dependent - Patient 0%    Refer to Care Plan for Long Term Goals  SHORT TERM GOAL WEEK 1 PT Short Term Goal 1 (Week 1): Pt wil ambulate 150 ft Mod I PT Short Term Goal 2 (Week 1): Pt will decressed TUG by 5 seconds PT Short Term Goal 3 (Week 1): Pt will be Mod I for transfers  Recommendations for other services: Therapeutic Recreation  Pet therapy, Kitchen group, Stress management, and Outing/community  reintegration  Skilled Therapeutic Intervention  Mobility Transfers Transfers: Sit to Stand;Stand to SLockheed MartinTransfers Sit to Stand: Minimal Assistance - Patient > 75% Stand to Sit: Contact Guard/Touching assist Stand Pivot Transfers: Minimal Assistance - Patient > 75%;Contact Guard/Touching assist Stand Pivot Transfer Details: Verbal  cues for precautions/safety;Verbal cues for sequencing;Verbal cues for gait pattern;Verbal cues for safe use of DME/AE Transfer (Assistive device): Rolling walker Locomotion  Gait Ambulation: Yes Gait Assistance: Contact Guard/Touching assist Gait Distance (Feet): 70 Feet Assistive device: Rolling walker Gait Gait: Yes Gait Pattern: Impaired Gait Pattern: Antalgic;Decreased weight shift to right;Decreased stance time - right Gait velocity: Decreased Stairs / Additional Locomotion Stairs: Yes Stairs Assistance: Contact Guard/Touching assist Stair Management Technique: Two rails Number of Stairs: 12 Height of Stairs: 6 (and 3') Wheelchair Mobility Wheelchair Mobility: No  Pt received sitting in WC and agreeable to therapy. Pt transported to therapy gym dependently in [place]. Therapist donned B ted hose and ASO total A. Evaluation performed as stated above.   Gait training: ~57f w/ RW and CGA. Therapist observed an antalgic gait w/ reduced stance time on RLE. Vc to keep RW closer to pt. Pt expressed that walking increased the discomfort she feels in her R hip and R ankle, however, does not define it as pain.   Stair training: pt negotiated 12 stairs using 2 HR and therapist providing CGA. Pt was instructed to ascend stairs w/ RLE and descend w/ LLE pt expressed that this aided in preventing increased discomfort.   Pt performed sit to stand transfers throughout session w RW and Min A for power up. When conducting the TUG pt was able to stand w/ supervision assist w/ RW. Pt  performed the TUG 2x due to sake of time. Trial 1 time was 40.64 seconds.  Pt stated that she could do it "faster." Trial 2 was 20.49 seconds. Therapist providing supervision assist for pt.   Pt transported back to room dependently for pain prevention and left seated in wc in the care of Nurse tech.   Discharge Criteria: Patient will be discharged from PT if patient refuses treatment 3 consecutive times without medical reason, if treatment goals not met, if there is a change in medical status, if patient makes no progress towards goals or if patient is discharged from hospital.  The above assessment, treatment plan, treatment alternatives and goals were discussed and mutually agreed upon: by patient  Scottlyn Mchaney 11/10/2022, 1:58 PM

## 2022-11-10 NOTE — Evaluation (Addendum)
Occupational Therapy Assessment and Plan  Patient Details  Name: Dominique Livingston MRN: [id number] Date of Birth: 05-29-1933  OT Diagnosis: abnormal posture, muscle weakness (generalized), and pain in joint Rehab Potential: Rehab Potential (ACUTE ONLY): Good ELOS: 7-10 days   Today's Date: 11/10/2022 OT Individual Time: 0900-1000 OT Individual Time Calculation (min): 60 min     Hospital Problem: Principal Problem:   Femoral neck fracture ([place]) Active Problems:   Essential hypertension   History of polymyalgia rheumatica   Avulsion fracture of right ankle   Past Medical History:  Past Medical History:  Diagnosis Date   Allergy    Anxiety    Arthritis    hands , stiffness in knees & hips    Colon polyp, hyperplastic    Diverticulosis    Endometriosis    Fibrocystic breast disease    GERD (gastroesophageal reflux disease)    Hyperlipidemia    Hypertension    Hypothyroidism    IBS (irritable bowel syndrome)    Polymyalgia rheumatica ([place])    Past Surgical History:  Past Surgical History:  Procedure Laterality Date   ABDOMINAL HYSTERECTOMY     APPENDECTOMY     BREAST BIOPSY Right    CATARACT EXTRACTION, BILATERAL Bilateral    cataracts   CHOLECYSTECTOMY N/A 05/09/2016   Procedure: LAPAROSCOPIC CHOLECYSTECTOMY WITH INTRAOPERATIVE CHOLANGIOGRAM;  Surgeon: Jackolyn Confer, MD;  Location: [facility];  Service: General;  Laterality: N/A;   LEFT COLECTOMY     removed about 18 in due to [place] Right 11/05/2022   Procedure: [place];  Surgeon: Gaynelle Arabian, MD;  Location: [facility];  Service: Orthopedics;  Laterality: Right;    Assessment & Plan Clinical Impression: Dominique Livingston is a 89 year old right-handed female with history of hypertension, endometriosis status post remote right hemicolectomy, IBS, hyperlipidemia, polymyalgia rheumatica.  Per chart review patient lives alone.  Independent and active prior to  admission.  Presented 11/03/2022 after mechanical fall at home after she tripped on the carpet.  Denied loss of consciousness.  Patient notes she was on the floor x 1 hour and was unable to reach the phone.  Admission chemistries unremarkable except glucose 144, WBC 11,400, hemoglobin 11.1.  X-rays and imaging revealed right femoral neck fracture.  Underwent right total hip arthroplasty, anterior approach 11/05/2022 per Dr. Maureen Ralphs.  Patient is weightbearing as tolerated.  Patient also sustained right ankle avulsion fracture conservative care with ASO brace applied.  Hospital course acute blood loss anemia 8.3 and monitored.  Presently on aspirin 325 mg twice daily for DVT prophylaxis.  Blood pressure soft maintained on low-dose lisinopril.  Therapy evaluations completed due to patient decreased functional mobility was admitted for a comprehensive rehab program.   Patient currently requires max with basic self-care skills and IADL secondary to muscle weakness, decreased cardiorespiratoy endurance, and decreased sitting balance, decreased standing balance, decreased postural control, and decreased balance strategies.  Prior to hospitalization, patient could complete all aspects of A/IADL's including driving with independent  level.   Patient will benefit from skilled intervention to decrease level of assist with basic self-care skills, increase independence with basic self-care skills, and increase level of independence with iADL prior to discharge home with care partner.  Anticipate patient will require 24 hour supervision and follow up home health.  OT - End of Session Activity Tolerance: Tolerates 10 - 20 min activity with multiple rests Endurance Deficit: Yes Endurance Deficit Description: Deficits due to discomfort in R hip/ R  ankle OT Assessment Rehab Potential (ACUTE ONLY): Good OT Barriers to Discharge: Inaccessible home environment;Decreased caregiver support;Home environment  access/layout;Incontinence OT Barriers to Discharge Comments: pt with a few steps to enter without rail, lives alone but family to provide initial S and assist as needed, tub shower without DME, R ant hip incision OT Patient demonstrates impairments in the following area(s): Warehouse manager;Endurance;Motor;Pain OT Basic ADL's Functional Problem(s): Grooming;Bathing;Dressing;Toileting OT Advanced ADL's Functional Problem(s): Simple Meal Preparation;Light Housekeeping;Laundry OT Transfers Functional Problem(s): Toilet;Tub/Shower OT Plan OT Intensity: Minimum of 1-2 x/day, 45 to 90 minutes OT Frequency: 5 out of 7 days OT Duration/Estimated Length of Stay: 7-10 days OT Treatment/Interventions: Balance/vestibular training;Disease mangement/prevention;Neuromuscular re-education;Therapeutic Exercise;Self Care/advanced ADL retraining;Wheelchair propulsion/positioning;DME/adaptive equipment instruction;Pain management;Skin care/wound managment;UE/LE Strength taining/ROM;Community reintegration;Patient/family education;Splinting/orthotics;UE/LE Coordination activities;Discharge planning;Functional mobility training;Psychosocial support;Therapeutic Activities OT Self Feeding Anticipated Outcome(s): indep OT Basic Self-Care Anticipated Outcome(s): Mod I OT Toileting Anticipated Outcome(s): Mod I OT Bathroom Transfers Anticipated Outcome(s): Supervision OT Recommendation Recommendations for Other Services: Therapeutic Recreation consult Therapeutic Recreation Interventions: Pet therapy;Kitchen group;Stress management;Outing/community reintergration Patient destination: Home Follow Up Recommendations: Home health OT Equipment Recommended: 3 in 1 bedside comode;Tub/shower bench;Rolling walker with 5" wheels Equipment Details: may require TTB, RW and 3 in 1 commode   OT Evaluation Precautions/Restrictions  Precautions Precautions: Fall Required Braces or Orthoses: Other Brace Other Brace: R  ankle ASO Restrictions Weight Bearing Restrictions: Yes RLE Weight Bearing: Weight bearing as tolerated LLE Weight Bearing: Weight bearing as tolerated Other Position/Activity Restrictions: WBAT General Chart Reviewed: Yes Family/Caregiver Present: No Vital Signs Therapy Vitals Temp: 97.9 F (36.6 C) Pulse Rate: 68 Resp: 16 BP: (!) 108/44 Patient Position (if appropriate): Lying Oxygen Therapy SpO2: 97 % O2 Device: Room Air Pain Pain Assessment Pain Scale: 0-10 Pain Score: 3  Pain Type: Acute pain Pain Location: Hip Pain Orientation: Right Pain Descriptors / Indicators: Aching;Discomfort Pain Frequency: Intermittent Pain Onset: With Activity Patients Stated Pain Goal: 1 Pain Intervention(s): Medication (See eMAR) Multiple Pain Sites: No Home Living/Prior Functioning Home Living Family/patient expects to be discharged to:: Private residence Living Arrangements: Alone Available Help at Discharge: Family Type of Home: House Home Access: Stairs to enter (Stairs in back deck (3)) Technical brewer of Steps: 3 Entrance Stairs-Rails: Right Home Layout: One level Bathroom Shower/Tub: Optometrist: Yes  Lives With: Alone Prior Function Level of Independence: Independent with basic ADLs, Independent with gait, Independent with homemaking with ambulation, Independent with transfers  Able to Take Stairs?: Yes Driving: Yes Vocation: Retired Surveyor, mining Baseline Vision/History: 0 No visual deficits Ability to See in Adequate Light: 0 Adequate Patient Visual Report: No change from baseline Vision Assessment?: No apparent visual deficits Perception  Perception: Within Functional Limits Praxis Praxis: Intact Cognition Cognition Overall Cognitive Status: Within Functional Limits for tasks assessed Arousal/Alertness: Awake/alert Orientation Level: Person;Place;Situation Memory: Appears intact Awareness: Appears  intact Problem Solving: Appears intact Safety/Judgment: Appears intact Brief Interview for Mental Status (BIMS) Repetition of Three Words (First Attempt): 3 Temporal Orientation: Year: Correct Temporal Orientation: Month: Accurate within 5 days Temporal Orientation: Day: Correct Recall: "Sock": Yes, no cue required Recall: "Blue": Yes, no cue required Recall: "Bed": Yes, no cue required BIMS Summary Score: 15 Sensation Sensation Light Touch: Appears Intact Proprioception: Appears Intact Coordination Gross Motor Movements are Fluid and Coordinated: No Coordination and Movement Description: Antalgic gait Motor  Motor Motor: Other (comment) Motor - Skilled Clinical Observations: Antalgic gait  Trunk/Postural Assessment  Cervical Assessment Cervical Assessment: Within Functional Limits Thoracic Assessment Thoracic Assessment: Within  Functional Limits Lumbar Assessment Lumbar Assessment: Within Functional Limits Postural Control Postural Control: Within Functional Limits  Balance Balance Balance Assessed: Yes Standardized Balance Assessment Standardized Balance Assessment: Timed Up and Go Test Timed Up and Go Test TUG: Normal TUG Normal TUG (seconds): 20.49 Static Sitting Balance Static Sitting - Balance Support: Feet supported Static Sitting - Level of Assistance: 6: Modified independent (Device/Increase time) Static Standing Balance Static Standing - Balance Support: During functional activity;Bilateral upper extremity supported Static Standing - Level of Assistance: 5: Stand by assistance Dynamic Standing Balance Dynamic Standing - Balance Support: During functional activity Dynamic Standing - Level of Assistance: 5: Stand by assistance Extremity/Trunk Assessment RUE Assessment RUE Assessment: Within Functional Limits LUE Assessment LUE Assessment: Within Functional Limits  Care Tool Care Tool Self Care Eating   Eating Assist Level: Set up assist    Oral Care     Oral Care Assist Level: Set up assist    Bathing   Body parts bathed by patient: Right arm;Left arm;Chest;Abdomen;Front perineal area;Right upper leg;Left upper leg;Face Body parts bathed by helper: Buttocks;Left lower leg;Right lower leg   Assist Level: Maximal Assistance - Patient 24 - 49%    Upper Body Dressing(including orthotics)   What is the patient wearing?: Pull over shirt;Bra   Assist Level: Set up assist    Lower Body Dressing (excluding footwear)   What is the patient wearing?: Underwear/pull up;Pants Assist for lower body dressing: Total Assistance - Patient < 25%    Putting on/Taking off footwear   What is the patient wearing?: Non-skid slipper socks;Orthosis Assist for footwear: Total Assistance - Patient < 25%       Care Tool Toileting Toileting activity   Assist for toileting: Moderate Assistance - Patient 50 - 74%     Care Tool Bed Mobility Roll left and right activity   Roll left and right assist level: Supervision/Verbal cueing    Sit to lying activity   Sit to lying assist level: Supervision/Verbal cueing    Lying to sitting on side of bed activity   Lying to sitting on side of bed assist level: the ability to move from lying on the back to sitting on the side of the bed with no back support.: Supervision/Verbal cueing     Care Tool Transfers Sit to stand transfer   Sit to stand assist level: Minimal Assistance - Patient > 75%    Chair/bed transfer   Chair/bed transfer assist level: Minimal Assistance - Patient > 75%     Toilet transfer   Assist Level: Minimal Assistance - Patient > 75%     Care Tool Cognition  Expression of Ideas and Wants Expression of Ideas and Wants: 4. Without difficulty (complex and basic) - expresses complex messages without difficulty and with speech that is clear and easy to understand  Understanding Verbal and Non-Verbal Content Understanding Verbal and Non-Verbal Content: 4. Understands (complex and basic) -  clear comprehension without cues or repetitions   Memory/Recall Ability Memory/Recall Ability : Current season;Location of own room;Staff names and faces;That he or she is in a hospital/hospital unit   Refer to Care Plan for [place] 1 OT Short Term Goal 1 (Week 1): STG=LTG's d/t LOS  Recommendations for other services: Therapeutic Recreation  Pet therapy, Kitchen group, Stress management, and Outing/community reintegration   Skilled Therapeutic Intervention ADL ADL Equipment Provided: Long-handled sponge Eating: Set up Where Assessed-Eating: Wheelchair Grooming: Setup Where Assessed-Grooming: Sitting at sink Upper Body Bathing: Setup  Where Assessed-Upper Body Bathing: Sitting at sink Lower Body Bathing: Maximal assistance Where Assessed-Lower Body Bathing: Sitting at sink;Standing at sink Upper Body Dressing: Setup Where Assessed-Upper Body Dressing: Sitting at sink Lower Body Dressing: Maximal assistance Where Assessed-Lower Body Dressing: Sitting at sink;Standing at sink Toileting: Minimal assistance Where Assessed-Toileting: Glass blower/designer: Psychiatric nurse Method: Arts development officer: Energy manager Method: Unable to assess Social research officer, government: Environmental education officer Method: Radiographer, therapeutic: Radio broadcast assistant ADL Comments: UB and grooming set up, LB max A, bathroom transfers min A amb with RW Mobility  Transfers Sit to Stand: Minimal Assistance - Patient > 75% Stand to Sit: Contact Guard/Touching assist   OT Interventions/Training:  Pt seen for full initial OT evaluation and training session this am. Pt in bed upon OT arrival. OT introduced role of therapy and purpose of session. OT conducted comprehensive OT evaluation and treatment session with status outlined in eval. Pt participated fully in ADL am retraining session. Pt will  benefit from CIR to maximize functional level and safety prior to discharge home with family support. Pt left at end of session up in w/c with chair alarm set, tray table and nurse call bell within reach.    Discharge Criteria: Patient will be discharged from OT if patient refuses treatment 3 consecutive times without medical reason, if treatment goals not met, if there is a change in medical status, if patient makes no progress towards goals or if patient is discharged from hospital.  The above assessment, treatment plan, treatment alternatives and goals were discussed and mutually agreed upon: by patient  Barnabas Lister 11/10/2022, 3:19 PM

## 2022-11-10 NOTE — Progress Notes (Signed)
Occupational Therapy Session Note  Patient Details  Name: Dominique Livingston MRN: [id number] Date of Birth: [birth year]  Today's Date: 11/10/2022 OT Individual Time: 1401-1531 OT Individual Time Calculation (min): 90 min (15 added minutes d/t ortho tech need to problem solve  for larger R ASO)    Short Term Goals: Week 1:  OT Short Term Goal 1 (Week 1): STG=LTG's d/t LOS  Skilled Therapeutic Interventions/Progress Updates:   Pt seen for 2nd session for OT pt's 1st day at CIR. Pt rerquested to trial toieting in BR without 3 in 1 commode as she felt was too hard and uncomfortable and the regular seat with grab bars would be a suitable height. Pt bed level upon initiation and required CGA for supine to EOB then min A with RW to amb to bathroom and back to w.\/c at sink. Was able to use standard toilet without commode topper with B grab bars with CGA. Set up for peri hygiene and CGA for pull up and down lower body garments. See Flowsheets for voiding data. Pt washed hands standing sink side with CGA and sat then on w/c. Joshua from nursing arrived for meds and both OT and nursing inspected R ankle skin and heel as pt continues to report raw feeling. OT noted anterior redness as well. Vonna Kotyk called orthotist and OT removed TEDS and placed ice on R ankle in the meantime in elevation. Very minor redness noted but pt reveals discomfort enough to disrupt sleep. Ortho tech agreed current ASO a medium and with pt's edema needs Large. Returned and OT had applied foam pad on posterior and ant region as well as light stockinette. Reapplied new R ASO and pt felt relief. Pt able to transfer back to bed, ice pack applied to R hip and ankle and educ to remove in 20-30 min, float heel on pillow and report comfort. Left pt bed level with bed exit alarm in place, needs and nurse call button in reach.     Therapy Documentation Precautions:  Precautions Precautions: Fall Required Braces or Orthoses: Other Brace Other  Brace: R ankle ASO Restrictions Weight Bearing Restrictions: Yes RLE Weight Bearing: Weight bearing as tolerated LLE Weight Bearing: Weight bearing as tolerated Other Position/Activity Restrictions: WBAT    Therapy/Group: Individual Therapy  Barnabas Lister 11/10/2022, 12:52 PM

## 2022-11-10 NOTE — Plan of Care (Signed)
Problem: RH Balance Goal: LTG Patient will maintain dynamic sitting balance (PT) Description: LTG:  Patient will maintain dynamic sitting balance with assistance during mobility activities (PT) Flowsheets (Taken 11/10/2022 1354) LTG: Pt will maintain dynamic sitting balance during mobility activities with:: Independent with assistive device  Goal: LTG Patient will maintain dynamic standing balance (PT) Description: LTG:  Patient will maintain dynamic standing balance with assistance during mobility activities (PT) Flowsheets (Taken 11/10/2022 1354) LTG: Pt will maintain dynamic standing balance during mobility activities with:: Independent with assistive device    Problem: Sit to Stand Goal: LTG:  Patient will perform sit to stand with assistance level (PT) Description: LTG:  Patient will perform sit to stand with assistance level (PT) Flowsheets (Taken 11/10/2022 1354) LTG: PT will perform sit to stand in preparation for functional mobility with assistance level: Independent with assistive device   Problem: RH Bed Mobility Goal: LTG Patient will perform bed mobility with assist (PT) Description: LTG: Patient will perform bed mobility with assistance, with/without cues (PT). Flowsheets (Taken 11/10/2022 1354) LTG: Pt will perform bed mobility with assistance level of: Independent with assistive device    Problem: RH Bed to Chair Transfers Goal: LTG Patient will perform bed/chair transfers w/assist (PT) Description: LTG: Patient will perform bed to chair transfers with assistance (PT). Flowsheets (Taken 11/10/2022 1354) LTG: Pt will perform Bed to Chair Transfers with assistance level: Independent with assistive device    Problem: RH Car Transfers Goal: LTG Patient will perform car transfers with assist (PT) Description: LTG: Patient will perform car transfers with assistance (PT). Flowsheets (Taken 11/10/2022 1354) LTG: Pt will perform car transfers with assist:: Independent with assistive  device    Problem: RH Furniture Transfers Goal: LTG Patient will perform furniture transfers w/assist (OT/PT) Description: LTG: Patient will perform furniture transfers  with assistance (OT/PT). Flowsheets (Taken 11/10/2022 1354) LTG: Pt will perform furniture transfers with assist:: Independent with assistive device    Problem: RH Ambulation Goal: LTG Patient will ambulate in controlled environment (PT) Description: LTG: Patient will ambulate in a controlled environment, # of feet with assistance (PT). Flowsheets (Taken 11/10/2022 1354) LTG: Pt will ambulate in controlled environ  assist needed:: Independent with assistive device LTG: Ambulation distance in controlled environment: 162f Goal: LTG Patient will ambulate in home environment (PT) Description: LTG: Patient will ambulate in home environment, # of feet with assistance (PT). Flowsheets (Taken 11/10/2022 1354) LTG: Pt will ambulate in home environ  assist needed:: Independent with assistive device Goal: LTG Patient will ambulate in community environment (PT) Description: LTG: Patient will ambulate in community environment, # of feet with assistance (PT). Flowsheets (Taken 11/10/2022 1354) LTG: Pt will ambulate in community environ  assist needed:: Independent with assistive device   Problem: RH Wheelchair Mobility Goal: LTG Patient will propel w/c in controlled environment (PT) Description: LTG: Patient will propel wheelchair in controlled environment, # of feet with assist (PT) Flowsheets (Taken 11/10/2022 1354) LTG: Pt will propel w/c in controlled environ  assist needed:: Independent with assistive device Goal: LTG Patient will propel w/c in home environment (PT) Description: LTG: Patient will propel wheelchair in home environment, # of feet with assistance (PT). Flowsheets (Taken 11/10/2022 1354) LTG: Pt will propel w/c in home environ  assist needed:: Independent with assistive device Goal: LTG Patient will propel w/c in community  environment (PT) Description: LTG: Patient will propel wheelchair in community environment, # of feet with assist (PT) Flowsheets (Taken 11/10/2022 1354) LTG: Pt will propel w/c in community environ  assist needed::  Independent with assistive device   Problem: RH Stairs Goal: LTG Patient will ambulate up and down stairs w/assist (PT) Description: LTG: Patient will ambulate up and down # of stairs with assistance (PT) Flowsheets (Taken 11/10/2022 1354) LTG: Pt will  ambulate up and down number of stairs: 12

## 2022-11-10 NOTE — Plan of Care (Signed)
Problem: RH Balance Goal: LTG: Patient will maintain dynamic sitting balance (OT) Description: LTG:  Patient will maintain dynamic sitting balance with assistance during activities of daily living (OT) Flowsheets (Taken 11/10/2022 1011) LTG: Pt will maintain dynamic sitting balance during ADLs with: Independent Goal: LTG Patient will maintain dynamic standing with ADLs (OT) Description: LTG:  Patient will maintain dynamic standing balance with assist during activities of daily living (OT)  Flowsheets (Taken 11/10/2022 1011) LTG: Pt will maintain dynamic standing balance during ADLs with: Independent with assistive device   Problem: Sit to Stand Goal: LTG:  Patient will perform sit to stand in prep for activites of daily living with assistance level (OT) Description: LTG:  Patient will perform sit to stand in prep for activites of daily living with assistance level (OT) Flowsheets (Taken 11/10/2022 1011) LTG: PT will perform sit to stand in prep for activites of daily living with assistance level: Independent with assistive device   Problem: RH Grooming Goal: LTG Patient will perform grooming w/assist,cues/equip (OT) Description: LTG: Patient will perform grooming with assist, with/without cues using equipment (OT) Flowsheets (Taken 11/10/2022 1011) LTG: Pt will perform grooming with assistance level of: Independent   Problem: RH Bathing Goal: LTG Patient will bathe all body parts with assist levels (OT) Description: LTG: Patient will bathe all body parts with assist levels (OT) Flowsheets (Taken 11/10/2022 1011) LTG: Pt will perform bathing with assistance level/cueing: Independent with assistive device    Problem: RH Dressing Goal: LTG Patient will perform upper body dressing (OT) Description: LTG Patient will perform upper body dressing with assist, with/without cues (OT). Flowsheets (Taken 11/10/2022 1011) LTG: Pt will perform upper body dressing with assistance level of: Independent Goal:  LTG Patient will perform lower body dressing w/assist (OT) Description: LTG: Patient will perform lower body dressing with assist, with/without cues in positioning using equipment (OT) Flowsheets (Taken 11/10/2022 1011) LTG: Pt will perform lower body dressing with assistance level of: Independent with assistive device   Problem: RH Toileting Goal: LTG Patient will perform toileting task (3/3 steps) with assistance level (OT) Description: LTG: Patient will perform toileting task (3/3 steps) with assistance level (OT)  Flowsheets (Taken 11/10/2022 1011) LTG: Pt will perform toileting task (3/3 steps) with assistance level: Independent with assistive device   Problem: RH Simple Meal Prep Goal: LTG Patient will perform simple meal prep w/assist (OT) Description: LTG: Patient will perform simple meal prep with assistance, with/without cues (OT). Flowsheets (Taken 11/10/2022 1011) LTG: Pt will perform simple meal prep with assistance level of: Supervision/Verbal cueing   Problem: RH Laundry Goal: LTG Patient will perform laundry w/assist, cues (OT) Description: LTG: Patient will perform laundry with assistance, with/without cues (OT). Flowsheets (Taken 11/10/2022 1011) LTG: Pt will perform laundry with assistance level of: Supervision/Verbal cueing   Problem: RH Light Housekeeping Goal: LTG Patient will perform light housekeeping w/assist (OT) Description: LTG: Patient will perform light housekeeping with assistance, with/without cues (OT). Flowsheets (Taken 11/10/2022 1011) LTG: Pt will perform light housekeeping with assistance level of: Supervision/Verbal cueing   Problem: RH Toilet Transfers Goal: LTG Patient will perform toilet transfers w/assist (OT) Description: LTG: Patient will perform toilet transfers with assist, with/without cues using equipment (OT) Flowsheets (Taken 11/10/2022 1011) LTG: Pt will perform toilet transfers with assistance level of: Supervision/Verbal cueing   Problem: RH  Tub/Shower Transfers Goal: LTG Patient will perform tub/shower transfers w/assist (OT) Description: LTG: Patient will perform tub/shower transfers with assist, with/without cues using equipment (OT) Flowsheets (Taken 11/10/2022 1011) LTG: Pt will  perform tub/shower stall transfers with assistance level of: Supervision/Verbal cueing

## 2022-11-10 NOTE — Progress Notes (Signed)
Inpatient Rehabilitation  Patient information reviewed and entered into eRehab system by Natina Wiginton M. Maely Clements, M.A., CCC/SLP, PPS Coordinator.  Information including medical coding, functional ability and quality indicators will be reviewed and updated through discharge.    

## 2022-11-11 DIAGNOSIS — K5903 Drug induced constipation: Secondary | ICD-10-CM

## 2022-11-11 DIAGNOSIS — I10 Essential (primary) hypertension: Secondary | ICD-10-CM

## 2022-11-11 DIAGNOSIS — M25551 Pain in right hip: Secondary | ICD-10-CM

## 2022-11-11 NOTE — IPOC Note (Signed)
Overall Plan of Care [facility]) Patient Details Name: Dominique Livingston MRN: [id number] DOB: 1933/03/01  Admitting Diagnosis: Femoral neck fracture [facility])  Hospital Problems: Principal Problem:   Femoral neck fracture ([place]) Active Problems:   Essential hypertension   History of polymyalgia rheumatica   Avulsion fracture of right ankle     Functional Problem List: Nursing Bowel, Endurance, Medication Management, Safety, Pain  PT Balance, Endurance, Edema, Motor, Pain  OT Balance, Skin Integrity, Endurance, Motor, Pain  SLP    TR         Basic ADL's: OT Grooming, Bathing, Dressing, Toileting     Advanced  ADL's: OT Simple Meal Preparation, Light Housekeeping, Laundry     Transfers: PT Floor, Car, Bed to Chair  OT Toilet, Tub/Shower     Locomotion: PT Ambulation, Stairs     Additional Impairments: OT    SLP        TR      Anticipated Outcomes Item Anticipated Outcome  Self Feeding indep  Swallowing      Basic self-care  Mod I  Toileting  Mod I   Bathroom Transfers Supervision  Bowel/Bladder  manage bowel w mod I assist  Transfers  Mod I  Locomotion  Mod I  Communication     Cognition     Pain  < 4 with prns  Safety/Judgment  manage w cues   Therapy Plan: PT Intensity: Minimum of 1-2 x/day ,45 to 90 minutes PT Frequency: 5 out of 7 days PT Duration Estimated Length of Stay: 7-9 days OT Intensity: Minimum of 1-2 x/day, 45 to 90 minutes OT Frequency: 5 out of 7 days OT Duration/Estimated Length of Stay: 7-10 days     Team Interventions: Nursing Interventions Bowel Management, Medication Management, Pain Management, Discharge Planning, Disease Management/Prevention, Patient/Family Education  PT interventions Ambulation/gait training, Community reintegration, Engineer, drilling, Neuromuscular re-education, IT trainer, UE/LE Strength taining/ROM, Wheelchair propulsion/positioning, Training and development officer, Discharge planning,  Functional electrical stimulation, Pain management, Therapeutic Activities, UE/LE Coordination activities, Functional mobility training, Patient/family education, Therapeutic Exercise, Splinting/orthotics  OT Interventions Balance/vestibular training, Disease mangement/prevention, Neuromuscular re-education, Therapeutic Exercise, Self Care/advanced ADL retraining, Wheelchair propulsion/positioning, DME/adaptive equipment instruction, Pain management, Skin care/wound managment, UE/LE Strength taining/ROM, Community reintegration, Barrister's clerk education, Splinting/orthotics, UE/LE Coordination activities, Discharge planning, Functional mobility training, Psychosocial support, Therapeutic Activities  SLP Interventions    TR Interventions    SW/CM Interventions Discharge Planning, Psychosocial Support, Patient/Family Education   Barriers to Discharge MD  Medical stability, Home enviroment access/loayout, Incontinence, Wound care, Lack of/limited family support, and Weight bearing restrictions  Nursing Home environment access/layout, Decreased caregiver support 1 level 2 ste no rail solo; dtr and son to assist prn  PT Decreased caregiver support, Inaccessible home environment, Lack of/limited family support    OT Inaccessible home environment, Decreased caregiver support, Home environment access/layout, Incontinence pt with a few steps to enter without rail, lives alone but family to provide initial S and assist as needed, tub shower without DME, R ant hip incision  SLP      SW       Team Discharge Planning: Destination: PT-Home ,OT- Home , SLP-  Projected Follow-up: PT-Outpatient PT, OT-  Home health OT, SLP-  Projected Equipment Needs: PT-To be determined, OT- 3 in 1 bedside comode, Tub/shower bench, Rolling walker with 5" wheels, SLP-  Equipment Details: PT- , OT-may require TTB, RW and 3 in 1 commode Patient/family involved in discharge planning: PT- Patient,  OT-Patient, SLP-   MD ELOS:  7-10 days  Medical Rehab Prognosis:  Good Assessment: The patient has been admitted for CIR therapies with the diagnosis of R femoral neck fx and R ankle avulsion fracture. The team will be addressing functional mobility, strength, stamina, balance, safety, adaptive techniques and equipment, self-care, bowel and bladder mgt, patient and caregiver education, . Goals have been set at supervision to mod I. Anticipated discharge destination is home.        See Team Conference Notes for weekly updates to the plan of care

## 2022-11-11 NOTE — Progress Notes (Addendum)
PROGRESS NOTE   Subjective/Complaints:   NO new concerns or complaints today. Reports she is happy with progress with therapy. Reports pain is controlled.    ROS:  Pt denies SOB, abd pain, CP, N/V/C/D, HA and vision changes Except for HPI   Objective:   No results found. Recent Labs    11/10/22 0523  WBC 8.4  HGB 8.3*  HCT 26.0*  PLT 228    Recent Labs    11/10/22 0523  NA 136  K 4.1  CL 99  CO2 27  GLUCOSE 104*  BUN 23  CREATININE 0.86  CALCIUM 8.6*     Intake/Output Summary (Last 24 hours) at 11/11/2022 1959 Last data filed at 11/11/2022 1900 Gross per 24 hour  Intake 528 ml  Output --  Net 528 ml         Physical Exam: Vital Signs Blood pressure (!) 110/45, pulse 81, temperature 98.4 F (36.9 C), temperature source Oral, resp. rate 16, height 5' 2"$  (1.575 m), weight 60.2 kg, SpO2 96 %.    General: awake, alert, appropriate, sitting up in bed-appears comfortable; NAD HENT: conjugate gaze; oropharynx moist CV: regular rate and rhythm; no JVD Pulmonary: CTA B/L; no W/R/R- good air movement GI: soft, NT, mildly distended; hypoactive BS Psychiatric: appropriate- HOH but bright interactive affect Musculoskeletal:     Cervical back: Neck supple. No tenderness.     Comments: UE strength 5-/5 in biceps, triceps, WE, grip and FA B/L RLE- HF 4/5; KE/KF 4/5 and DF 3-/5 and PF 4+/5 LLE- 5-/5 in same muscles Mild swelling of R hip/upper thigh and R ankle- wearing corset lace up ankle brace- foam dressing under it-   Skin:    General: Skin is warm and dry.     Comments: Hip incision clean and dry with original surgical dressing in place.  Appropriately tender R ankle and anterior tibialis are bruised Raw spot on back of R ankle  Neurological:     Mental Status: She is alert and oriented to person, place, and time.     Comments: Patient is alert and oriented x 3 and follows commands.  Intact to  light touch in all 4 extremities  Assessment/Plan: 1. Functional deficits which require 3+ hours per day of interdisciplinary therapy in a comprehensive inpatient rehab setting. Physiatrist is providing close team supervision and 24 hour management of active medical problems listed below. Physiatrist and rehab team continue to assess barriers to discharge/monitor patient progress toward functional and medical goals  Care Tool:  Bathing    Body parts bathed by patient: Right arm, Left arm, Chest, Abdomen, Front perineal area, Right upper leg, Left upper leg, Face   Body parts bathed by helper: Buttocks, Left lower leg, Right lower leg     Bathing assist Assist Level: Maximal Assistance - Patient 24 - 49%     Upper Body Dressing/Undressing Upper body dressing   What is the patient wearing?: Pull over shirt, Bra    Upper body assist Assist Level: Set up assist    Lower Body Dressing/Undressing Lower body dressing      What is the patient wearing?: Underwear/pull up, Pants     Lower  body assist Assist for lower body dressing: Total Assistance - Patient < 25%     Toileting Toileting    Toileting assist Assist for toileting: Moderate Assistance - Patient 50 - 74%     Transfers Chair/bed transfer  Transfers assist     Chair/bed transfer assist level: Minimal Assistance - Patient > 75%     Locomotion Ambulation   Ambulation assist      Assist level: Contact Guard/Touching assist Assistive device: Walker-rolling Max distance: 41f   Walk 10 feet activity   Assist     Assist level: Contact Guard/Touching assist Assistive device: Walker-rolling   Walk 50 feet activity   Assist    Assist level: Contact Guard/Touching assist Assistive device: Walker-rolling    Walk 150 feet activity   Assist    Assist level: Contact Guard/Touching assist Assistive device: Walker-rolling    Walk 10 feet on uneven surface  activity   Assist     Assist  level: Contact Guard/Touching assist Assistive device: Walker-rolling   Wheelchair     Assist Is the patient using a wheelchair?: Yes Type of Wheelchair: Manual           Wheelchair 50 feet with 2 turns activity    Assist            Wheelchair 150 feet activity     Assist          Blood pressure (!) 110/45, pulse 81, temperature 98.4 F (36.9 C), temperature source Oral, resp. rate 16, height 5' 2"$  (1.575 m), weight 60.2 kg, SpO2 96 %.  Medical Problem List and Plan: 1. Functional deficits secondary to right femoral neck fracture after mechanical fall.  Status post right total hip arthroplasty anterior approach 11/05/2022.  Weightbearing as tolerated             -patient may  shower-cover incision             -ELOS/Goals: 10-14 days- supervision to CGA   Con't CIR PT and OT 2.  Antithrombotics: -DVT/anticoagulation:  Mechanical: Antiembolism stockings, thigh (TED hose) Bilateral lower extremities.  Check vascular study             -antiplatelet therapy: Aspirin 325 mg twice daily x 18 days then begin aspirin 81 mg daily for 3 weeks then discontinue 3. Pain Management: Hydrocodone/tramadol as needed.  Robaxin as needed for muscle spasms- recommended to take tylenol or tramadol before therapy sessions.   2/9- asked pt to take tylenol before therapy- can ask for tramadol if tylenol "not enough". Let nursing know  2/10 reports pain is controlled, continue current medications 4. Mood/Behavior/Sleep: Xanax 0.25 mg twice daily as needed .Provide emotional support             -antipsychotic agents: N/A 5. Neuropsych/cognition: This patient is capable of making decisions on her own behalf. 6. Skin/Wound Care: Routine skin checks 7. Fluids/Electrolytes/Nutrition: Routine in and outs with follow-up chemistries  2/9- labs look good 8.  Right ankle avulsion fracture.  ASO ankle brace applied.  Weightbearing as tolerated 9.  Acute blood loss anemia.  Follow-up CBC  2/9- Hb  8.3- stable- con't to monitor 10.  Hypertension.  Low-dose lisinopril 5 mg daily controlled    11/11/2022    7:36 PM 11/11/2022    2:30 PM 11/11/2022   10:15 AM  Vitals with BMI  Systolic [id number] Diastolic 45 45 97  Pulse 81 78 74    11.  Hypothyroidism.  Synthroid 12. Insomnia- will  add Trazodone 25-50 mg QHS for sleep prn  2/9- slept somewhat better- con't regimen 13. Constipation- rare Bms- if no BM by tomorrow, will give Sorbitol and soap suds enema if necessary to clean her out.  - also con't benefiber, 2-3x/day- her home dose.   2/9- will give Sorbitol after therapy today 30 cc to get her cleaned out. If need be, add soap suds enema.   2/10 LBM today  14. Urinary urgency- hopefully trazodone and Anticholinergic effects help her with urinary urgency; if not, will need to check U/A and Cx and maybe if that's negative, use Myrbetriq?  2/9- doing slightly better- wait on U/A etc for now       LOS: 2 days A FACE TO FACE EVALUATION WAS PERFORMED  Dominique Livingston 11/11/2022, 7:59 PM

## 2022-11-11 NOTE — Progress Notes (Signed)
Occupational Therapy Session Note  Patient Details  Name: Dominique Livingston MRN: [id number] Date of Birth: 17-Jan-1933  Today's Date: 11/11/2022 OT Individual Time: [phone] (60 min)   Short Term Goals: Week 1:  OT Short Term Goal 1 (Week 1): STG=LTG's d/t LOS  Skilled Therapeutic Interventions/Progress Updates:  Pt received resting in bed for skilled OT session with focus on BADL retraining. Pt agreeable to interventions, demonstrating overall pleasant mood. Pt reported 1/10 pain, stating "it feels a lot better than last night" in reference to R-leg pain. OT offering intermediate rest breaks and positioning suggestions throughout session to address pain/fatigue and maximize participation/safety in session.   Pt performs all functional transfers with CGA + RW. Standing at sink, pt completes oral care, simple face-washing, and application of lotion with supervision + RW. Pt tolerates standing for ~5 mins. Pt then completes full-body dressing with education provided on use of reacher for R-side dressing. Pt stands to don pants and UB garments with supervision+RW.   Pt ambulates more than household-level distance from room<>simulated apartment with supervision + RW. In apartment, pt performs tub transfer with supervision, pt and OT discuss home-bathroom layout and potential DME recommendations. Home measurement sheet issued.   Pt remained resting in recliner with all immediate needs met and pad alarm activate at end of session. Pt continues to be appropriate for skilled OT intervention to promote further functional independence.   Therapy Documentation Precautions:  Precautions Precautions: Fall Required Braces or Orthoses: Other Brace Other Brace: R ankle ASO Restrictions Weight Bearing Restrictions: Yes RLE Weight Bearing: Weight bearing as tolerated LLE Weight Bearing: Weight bearing as tolerated Other Position/Activity Restrictions: WBAT  Therapy/Group: Individual Therapy  Maudie Mercury, OTR/L, MSOT  11/11/2022, 7:24 AM

## 2022-11-11 NOTE — Progress Notes (Signed)
Patient up in high back sitting chair. Declines pain or discomfort. Right hip surgical dressing CDI. Encouraged IS. Call light within reach.

## 2022-11-12 DIAGNOSIS — S93401D Sprain of unspecified ligament of right ankle, subsequent encounter: Secondary | ICD-10-CM

## 2022-11-12 MED ORDER — TRAZODONE HCL 50 MG PO TABS
75.0000 mg | ORAL_TABLET | Freq: Every day | ORAL | Status: DC
  Administered 2022-11-12 – 2022-11-14 (×3): 75 mg via ORAL
  Filled 2022-11-12 (×3): qty 2

## 2022-11-12 MED ORDER — LISINOPRIL 5 MG PO TABS
2.5000 mg | ORAL_TABLET | Freq: Every day | ORAL | Status: DC
  Administered 2022-11-13 – 2022-11-15 (×3): 2.5 mg via ORAL
  Filled 2022-11-12 (×3): qty 1

## 2022-11-12 NOTE — Progress Notes (Signed)
PROGRESS NOTE   Subjective/Complaints:   She had some soreness after therapy that improved with medications.  Poor sleep last night.  She finds her foot brace very uncomfortable.   ROS:  Pt denies SOB, abd pain, CP, N/V/C/D, HA and vision changes Positive for insomnia Except for HPI   Objective:   No results found. Recent Labs    11/10/22 0523  WBC 8.4  HGB 8.3*  HCT 26.0*  PLT 228    Recent Labs    11/10/22 0523  NA 136  K 4.1  CL 99  CO2 27  GLUCOSE 104*  BUN 23  CREATININE 0.86  CALCIUM 8.6*     Intake/Output Summary (Last 24 hours) at 11/12/2022 1050 Last data filed at 11/12/2022 0743 Gross per 24 hour  Intake 668 ml  Output --  Net 668 ml         Physical Exam: Vital Signs Blood pressure (!) 97/51, pulse 69, temperature 98.4 F (36.9 C), temperature source Oral, resp. rate 18, height 5' 2"$  (1.575 m), weight 60.2 kg, SpO2 95 %.    General: awake, alert, appropriate, sitting up in bed-appears comfortable; NAD HENT: conjugate gaze; oropharynx moist CV: regular rate and rhythm; no JVD Pulmonary: CTA B/L; no W/R/R- good air movement GI: soft, NT, not distended; hypoactive BS Psychiatric: appropriate- very pleasant Musculoskeletal:     Cervical back: Neck supple. No tenderness.     Comments: UE strength 5-/5 in biceps, triceps, WE, grip and FA B/L RLE- HF 4/5; KE/KF 4/5 and DF 3-/5 and PF 4+/5 LLE- 5-/5 in same muscles Mild swelling of R hip/upper thigh and R ankle- wearing corset lace up ankle brace- foam dressing under it-   Skin:    General: Skin is warm and dry.     Comments: Hip incision clean and dry with original surgical dressing in place.  Appropriately tender R ankle and anterior tibialis are bruised Raw spot on back of R ankle  Neurological:     Mental Status: She is alert and oriented to person, place, and time. Hard of hearing    Comments: Patient is alert and oriented x 3  and follows commands.  Intact to light touch in all 4 extremities  Assessment/Plan: 1. Functional deficits which require 3+ hours per day of interdisciplinary therapy in a comprehensive inpatient rehab setting. Physiatrist is providing close team supervision and 24 hour management of active medical problems listed below. Physiatrist and rehab team continue to assess barriers to discharge/monitor patient progress toward functional and medical goals  Care Tool:  Bathing    Body parts bathed by patient: Right arm, Left arm, Chest, Abdomen, Front perineal area, Right upper leg, Left upper leg, Face   Body parts bathed by helper: Buttocks, Left lower leg, Right lower leg     Bathing assist Assist Level: Maximal Assistance - Patient 24 - 49%     Upper Body Dressing/Undressing Upper body dressing   What is the patient wearing?: Pull over shirt, Bra    Upper body assist Assist Level: Set up assist    Lower Body Dressing/Undressing Lower body dressing      What is the patient wearing?: Underwear/pull up,  Pants     Lower body assist Assist for lower body dressing: Total Assistance - Patient < 25%     Toileting Toileting    Toileting assist Assist for toileting: Moderate Assistance - Patient 50 - 74%     Transfers Chair/bed transfer  Transfers assist     Chair/bed transfer assist level: Minimal Assistance - Patient > 75%     Locomotion Ambulation   Ambulation assist      Assist level: Contact Guard/Touching assist Assistive device: Walker-rolling Max distance: 91f   Walk 10 feet activity   Assist     Assist level: Contact Guard/Touching assist Assistive device: Walker-rolling   Walk 50 feet activity   Assist    Assist level: Contact Guard/Touching assist Assistive device: Walker-rolling    Walk 150 feet activity   Assist    Assist level: Contact Guard/Touching assist Assistive device: Walker-rolling    Walk 10 feet on uneven surface   activity   Assist     Assist level: Contact Guard/Touching assist Assistive device: Walker-rolling   Wheelchair     Assist Is the patient using a wheelchair?: Yes Type of Wheelchair: Manual           Wheelchair 50 feet with 2 turns activity    Assist            Wheelchair 150 feet activity     Assist          Blood pressure (!) 97/51, pulse 69, temperature 98.4 F (36.9 C), temperature source Oral, resp. rate 18, height 5' 2"$  (1.575 m), weight 60.2 kg, SpO2 95 %.  Medical Problem List and Plan: 1. Functional deficits secondary to right femoral neck fracture after mechanical fall.  Status post right total hip arthroplasty anterior approach 11/05/2022.  Weightbearing as tolerated             -patient may  shower-cover incision             -ELOS/Goals: 10-14 days- supervision to CGA   Con't CIR PT and OT 2.  Antithrombotics: -DVT/anticoagulation:  Mechanical: Antiembolism stockings, thigh (TED hose) Bilateral lower extremities.  Check vascular study             -antiplatelet therapy: Aspirin 325 mg twice daily x 18 days then begin aspirin 81 mg daily for 3 weeks then discontinue 3. Pain Management: Hydrocodone/tramadol as needed.  Robaxin as needed for muscle spasms- recommended to take tylenol or tramadol before therapy sessions.   2/9- asked pt to take tylenol before therapy- can ask for tramadol if tylenol "not enough". Let nursing know  2/11 continue prn tylenol, hydrocodone, tramadol prn for pain 4. Mood/Behavior/Sleep: Xanax 0.25 mg twice daily as needed .Provide emotional support             -antipsychotic agents: N/A 5. Neuropsych/cognition: This patient is capable of making decisions on her own behalf. 6. Skin/Wound Care: Routine skin checks 7. Fluids/Electrolytes/Nutrition: Routine in and outs with follow-up chemistries  2/9- labs look good 8.  Right ankle avulsion fracture.  ASO ankle brace applied.  Weightbearing as tolerated  -2/11 Per prior  ortho note ASO when needed when ambulating, will adjust orders 9.  Acute blood loss anemia.  Follow-up CBC  2/9- Hb 8.3- stable- con't to monitor 10.  Hypertension.  Low-dose lisinopril 5 mg daily 2/11 decrease lisinopril to 2.5 mg    11/12/2022    5:30 AM 11/11/2022    7:36 PM 11/11/2022    2:30 PM  Vitals with  BMI  Systolic 97 [id number]  Diastolic 51 45 45  Pulse 69 81 78    11.  Hypothyroidism.  Synthroid 12. Insomnia- will add Trazodone 25-50 mg QHS for sleep prn  2/9- slept somewhat better- con't regimen  2/11 will increase trazodone to 109m 13. Constipation- rare Bms- if no BM by tomorrow, will give Sorbitol and soap suds enema if necessary to clean her out.  - also con't benefiber, 2-3x/day- her home dose.   2/9- will give Sorbitol after therapy today 30 cc to get her cleaned out. If need be, add soap suds enema.   2/11 LBM today, improved  14. Urinary urgency- hopefully trazodone and Anticholinergic effects help her with urinary urgency; if not, will need to check U/A and Cx and maybe if that's negative, use Myrbetriq?  2/9- doing slightly better- wait on U/A etc for now       LOS: 3 days A FACE TO FACE EVALUATION WAS PERFORMED  YJennye Boroughs2/08/2023, 10:50 AM

## 2022-11-12 NOTE — Progress Notes (Signed)
Occupational Therapy Session Note  Patient Details  Name: Dominique Livingston MRN: [id number] Date of Birth: [birth year]  Today's Date: 11/12/2022 OT Individual Time: 1250-1405 OT Individual Time Calculation (min): 75 min    Short Term Goals: Week 1:  OT Short Term Goal 1 (Week 1): STG=LTG's d/t LOS  Skilled Therapeutic Interventions/Progress Updates:    Patient seen this day for skilled OT to improve functional outcome towards goal attainment. The pt was able to trasfer from bed LOF to w/c using a stand pivot transfer by incorporating the grab bars and arm rest of the w/c with SBA.  The pt was transported to the gym and was able to complete the Scifit for 15 minutes in duration, the pt  required. The pt was able to remove suction pegs from a surface while seated unsupported with 2 rest break.  The pt went on to use a 2lb dowel for shld flexion, horizontal abduction, and shld rotation 2 sets of 10 with rest breaks as needed, the pt required 1 rest break between sets. The pt returned to her room and was able to transfer back to bed with SBA using the bed rail for additional assistance. The call light and bedside table were within reach and the bed alarm was activated.   Therapy Documentation Precautions:  Precautions Precautions: Fall Required Braces or Orthoses: Other Brace Other Brace: R ankle ASO Restrictions Weight Bearing Restrictions: Yes RLE Weight Bearing: Weight bearing as tolerated LLE Weight Bearing: Weight bearing as tolerated Other Position/Activity Restrictions: WBAT   Other Treatments:     Therapy/Group: Individual Therapy  Yvonne Kendall 11/12/2022, 2:12 PM

## 2022-11-12 NOTE — Progress Notes (Signed)
Occupational Therapy Session Note  Patient Details  Name: Dominique Livingston MRN: [id number] Date of Birth: January 05, 1933  Today's Date: 11/12/2022 OT Individual Time: [id number] OT Individual Time Calculation (min): 74 min   Short Term Goals: Week 1:  OT Short Term Goal 1 (Week 1): STG=LTG's d/t LOS  Skilled Therapeutic Interventions/Progress Updates:  Pt received resting in bed for skilled OT session with focus on BADL retraining. Pt agreeable to interventions, demonstrating overall pleasant mood. Pt reported 8-9/10 pain, stating "it took a little pill right before you came" in reference to pain medication. OT offering intermediate rest breaks and positioning suggestions throughout session to address pain/fatigue and maximize participation/safety in session.   Pt performs room-level functional mobility to complete toileting, clothing retrieval, and shower, with supervision + RW. Pt completes 3/3 toileting activities with supervision + use of RW/grab bars for transfer, continent of urine.   Pt completes showering activities with education on use of long-handled sponge and overall close supervision, pt cleaning peri-areas with lateral leans.  Pt re-educated on use of reacher to doff/don LB garments, completing with Min A for effective use of AE. Pt doffs/dons UB garments with modified independence.   Sitting in [name] at sink-side, pt completes hair drying/brushing with item retrieval. Pt's discomfort with prevalon boot communicated to MD/PT for further assessment.   Pt remained sitting in WC with NT present and all immediate needs met at end of session. Pt continues to be appropriate for skilled OT intervention to promote further functional independence.   Therapy Documentation Precautions:  Precautions Precautions: Fall Required Braces or Orthoses: Other Brace Other Brace: R ankle ASO Restrictions Weight Bearing Restrictions: Yes RLE Weight Bearing: Weight bearing as tolerated LLE Weight  Bearing: Weight bearing as tolerated Other Position/Activity Restrictions: WBAT  Therapy/Group: Individual Therapy  Maudie Mercury, OTR/L, MSOT  11/12/2022, 7:55 AM

## 2022-11-12 NOTE — Progress Notes (Signed)
Physical Therapy Session Note  Patient Details  Name: Dominique Livingston MRN: [id number] Date of Birth: October 15, 1932  Today's Date: 11/12/2022 PT Individual Time: 1100-1200 PT Individual Time Calculation (min): 60 min   Short Term Goals: Week 1:  PT Short Term Goal 1 (Week 1): Pt wil ambulate 150 ft Mod I PT Short Term Goal 2 (Week 1): Pt will decressed TUG by 5 seconds PT Short Term Goal 3 (Week 1): Pt will be Mod I for transfers  Skilled Therapeutic Interventions/Progress Updates:      Therapy Documentation Precautions:  Precautions Precautions: Fall Required Braces or Orthoses: Other Brace Other Brace: R ankle ASO Restrictions Weight Bearing Restrictions: Yes RLE Weight Bearing: Weight bearing as tolerated LLE Weight Bearing: Weight bearing as tolerated Other Position/Activity Restrictions: WBAT     Pt received seated in recliner at bedside, agreeable to PT session with emphasis on gait and balance training.   Pt reports skin redness and discomfort from ankle brace, notified MD and order changed to PRN. Pt completed session without right ankle brace and reports increase comfort.   Pt CGA with sit to stand and ambulated 130 ft + 190 ft +240 ft with no AD. Initially pt required min A and faded to CGA following warm up. PT provided min cues for step length and gait speed (pacing).   Pt reported lightheadedness with activity and orthostatics assessed and negative in session:   Sit BP: 128/57 (78)   Standing BP: 119/53 (72)  Pt participated in [place] balance assessment and results recorded below. Pt is at significant falls risk based on score of 42/56.   Pt left semi-reclined in bed with all needs in reach and alarm on.   Pt initially reported 10/10 R LE pain that faded 2-3/10 with activity. Pt pre-medicated and provided with rest breaks for relief.     Balance: Standardized Balance Assessment Standardized Balance Assessment: Berg Balance Test Berg Balance Test Sit to  Stand: Able to stand without using hands and stabilize independently Standing Unsupported: Able to stand safely 2 minutes Sitting with Back Unsupported but Feet Supported on Floor or Stool: Able to sit safely and securely 2 minutes Stand to Sit: Sits safely with minimal use of hands Transfers: Able to transfer safely, minor use of hands Standing Unsupported with Eyes Closed: Able to stand 10 seconds safely Standing Ubsupported with Feet Together: Able to place feet together independently and stand 1 minute safely From Standing, Reach Forward with Outstretched Arm: Can reach confidently >25 cm (10") From Standing Position, Pick up Object from Floor: Able to pick up shoe safely and easily From Standing Position, Turn to Look Behind Over each Shoulder: Looks behind from both sides and weight shifts well Turn 360 Degrees: Able to turn 360 degrees safely but slowly Standing Unsupported, Alternately Place Feet on Step/Stool: Needs assistance to keep from falling or unable to try Standing Unsupported, One Foot in Front: Loses balance while stepping or standing Standing on One Leg: Unable to try or needs assist to prevent fall Total Score: 42  Patient demonstrates increased fall risk as noted by score of   /56 on Berg Balance Scale.  (<36= high risk for falls, close to 100%; 37-45 significant >80%; 46-51 moderate >50%; 52-55 lower >25%)     Therapy/Group: Individual Therapy  Verl Dicker Verl Dicker PT, DPT  11/12/2022, 11:47 AM

## 2022-11-13 ENCOUNTER — Ambulatory Visit: Payer: Self-pay

## 2022-11-13 DIAGNOSIS — S72001D Fracture of unspecified part of neck of right femur, subsequent encounter for closed fracture with routine healing: Secondary | ICD-10-CM | POA: Diagnosis not present

## 2022-11-13 DIAGNOSIS — S82891D Other fracture of right lower leg, subsequent encounter for closed fracture with routine healing: Secondary | ICD-10-CM | POA: Diagnosis not present

## 2022-11-13 NOTE — Progress Notes (Signed)
Physical Therapy Session Note  Patient Details  Name: Dominique Livingston MRN: [id number] Date of Birth: [birth year]  Today's Date: 11/13/2022 PT Individual Time: 0800-0900, 1300-1415  PT Individual Time Calculation (min): 60 min , 75 min   Short Term Goals: Week 1:  PT Short Term Goal 1 (Week 1): Pt wil ambulate 150 ft Mod I PT Short Term Goal 2 (Week 1): Pt will decressed TUG by 5 seconds PT Short Term Goal 3 (Week 1): Pt will be Mod I for transfers  Skilled Therapeutic Interventions/Progress Updates:      Therapy Documentation Precautions:  Precautions Precautions: Fall Required Braces or Orthoses: Other Brace Other Brace: R ankle ASO Restrictions Weight Bearing Restrictions: Yes RLE Weight Bearing: Weight bearing as tolerated LLE Weight Bearing: Weight bearing as tolerated Other Position/Activity Restrictions: WBAT  Treatment Session 1:   Pt received semi-reclined in bed, agreeable to PT session.   Pt with unrated right hip pain with mobility, nursing notified, and administered pain medication in session.   Pt (S) for safety with min verbal cues for sequencing with supine to sit from flat bed and no use of bed features. Pt min A with hand held support L UE with gait x 10 ft to bathroom no AD.   Pt continent of bladder and with + BM, nursing documented in flowsheet.   PT provided total A to don ted hose, socks, and shoes. Pt ambulated ~150 ft to main gym with SBA with min verbal cues for upright gaze as pt presents with forward trunk flexion.    Pt navigated 12 steps with right hand rail with supervision for safety and min verbal and visual cues for sequencing.   Patient demonstrates increased fall risk as noted by score of 18/30 on  Functional Gait Assessment.   <22/30 = predictive of falls, <20/30 = fall in 6 months, <18/30 = predictive of falls in PD MCID: 5 points stroke population, 4 points geriatric population (ANPTA Core Set of Outcome Measures for Adults with  Neurologic Conditions, 2018)   Standardized Balance Assessment Standardized Balance Assessment: Functional Gait Assessment Functional Gait  Assessment Gait Level Surface: Walks 20 ft in less than 7 sec but greater than 5.5 sec, uses assistive device, slower speed, mild gait deviations, or deviates 6-10 in outside of the 12 in walkway width. Change in Gait Speed: Able to smoothly change walking speed without loss of balance or gait deviation. Deviate no more than 6 in outside of the 12 in walkway width. Gait with Horizontal Head Turns: Performs head turns smoothly with slight change in gait velocity (eg, minor disruption to smooth gait path), deviates 6-10 in outside 12 in walkway width, or uses an assistive device. Gait with Vertical Head Turns: Performs head turns with no change in gait. Deviates no more than 6 in outside 12 in walkway width. Gait and Pivot Turn: Pivot turns safely in greater than 3 sec and stops with no loss of balance, or pivot turns safely within 3 sec and stops with mild imbalance, requires small steps to catch balance. Step Over Obstacle: Is able to step over one shoe box (4.5 in total height) but must slow down and adjust steps to clear box safely. May require verbal cueing. Gait with Narrow Base of Support: Ambulates less than 4 steps heel to toe or cannot perform without assistance. Gait with Eyes Closed: Walks 20 ft, uses assistive device, slower speed, mild gait deviations, deviates 6-10 in outside 12 in walkway width. Ambulates 20 ft in  less than 9 sec but greater than 7 sec. Ambulating Backwards: Walks 20 ft, uses assistive device, slower speed, mild gait deviations, deviates 6-10 in outside 12 in walkway width. Steps: Two feet to a stair, must use rail. Total Score: 18  Pt ambulated ~150 ft SBA with no AD to return to room and left seated in recliner at bedside with all needs in reach and alarm on.   Treatment Session 2:   Pt received seated in recliner at bedside  and reports right foot soreness and edema. Pt currently wearing ted hose and shoes. PT removed shoes and pt wore ted hose and socks in session for pain relief.   Pt ambulated >500 ft throughout session no AD with supervision to 1st floor of [employer] for community integration and pt reports minimal fatigue.   Pt relates feeling confident with ambulation but has concerns about getting tired in the community. PT provided pt with rollator and pt ambulated with device with supervision. Pt debating whether she would like a rollator or ambulate independently. Pt states she will purchase on her own when home if she needs it, PT recommends pt is safe to ambulate without an AD.   Pt performed timed up and go assessment and requires <13.5 seconds indicating pt is at decreased risk for falls. Pt required 10 seconds with standard TUG and 9.75 with dual task TUG (ambulation with cup filled with water).   Pt ambulated to room and voided. Pt left seated edge of bed with NT present.   Therapy/Group: Individual Therapy  Verl Dicker Verl Dicker PT, DPT  11/13/2022, 7:35 AM

## 2022-11-13 NOTE — Patient Outreach (Signed)
  Care Coordination   Follow Up Visit Note   11/13/2022 Name: KAYANI RAPAPORT MRN: [id number] DOB: January 27, 1933  GENNIE EISINGER is a 89 y.o. year old female who sees Baxley, Cresenciano Lick, MD for primary care. I reviewed patient's chart today in preparation to contact patient.   What matters to the patients health and wellness today?  Patient is currently listed as inpatient at [facility]             This Visit's Progress    Nurse Care Coordination Activities: further follow up needed       Care Coordination Interventions: Reviewed patient's chart in preparation to contact patient Noted patient is currently inpatient at [facility], 2/2-2/8; dx: hip fracture  Noted patient was transferred to [facility] and is currently still inpatient            SDOH assessments and interventions completed:  No     Care Coordination Interventions:  Yes, provided   Follow up plan: Follow up call pending patient's discharge home   Encounter Outcome:  Pt. Visit Completed

## 2022-11-13 NOTE — Progress Notes (Signed)
Met with patient. Informed of team conference every Tuesday. SW will follow up with d/c date, equipment if any, barriers, and goals. Right total hip with anterior approach. Surgical dressing in place. Dicussed incision care once dressing removed. Will ask about removing dressing tomorrow. WBAT. Right ankle fx with AFO brace. Reports pain when stands but after 3 ste goes away. Encourage once home to get up and moving around. ASA 331m BID for DVT prophylaxis. MD will decide if to continue at discharge or stop. Discuss increasing protein and ca. Also discussed HLD. LDL 191 and trig 198. Needs to reduce amount of bread and butter. Discouraged box foods and to eat fresh foods. If eating veggies from can to rinse off first. Reports that most veggies are frozen without butter or sauce. Was taking lipitor and could not tolerate and was switched to zetia  and that was stopped due to blockage. Encouraged her to look though book, add any exercises that therapy may suggest and discharge paperwork in binder for follow up appts. Day of discharge PA will go over paperwork, medications, instructions, and follow up appts. Have person picking up here at 0800 and when they arrive contact nursing so that can start discharge process. All needs met, call bell in reach.

## 2022-11-13 NOTE — Progress Notes (Signed)
PROGRESS NOTE   Subjective/Complaints:  Pt worried that walked really far yesterday without ASO on R foot/ankle- Foot/ankle feeling OK but doesn't want to damage anything.  Walked 130 ft   Slept well- thinks meds might have been changed? Trazodone increased to 75 mg QHS.   LBM yesterday.  ROS:   Pt denies SOB, abd pain, CP, N/V/C/D, and vision changes  Except for HPI   Objective:   No results found. No results for input(s): "WBC", "HGB", "HCT", "PLT" in the last 72 hours. No results for input(s): "NA", "K", "CL", "CO2", "GLUCOSE", "BUN", "CREATININE", "CALCIUM" in the last 72 hours.  Intake/Output Summary (Last 24 hours) at 11/13/2022 0759 Last data filed at 11/12/2022 2045 Gross per 24 hour  Intake 560 ml  Output --  Net 560 ml        Physical Exam: Vital Signs Blood pressure (!) 111/50, pulse 70, temperature 98.1 F (36.7 C), resp. rate 16, height 5' 2"$  (1.575 m), weight 60.2 kg, SpO2 94 %.     General: awake, alert, appropriate, sitting up in bed;  NAD HENT: conjugate gaze; oropharynx moist CV: regular rate and rhythm; no JVD Pulmonary: CTA B/L; no W/R/R- good air movement GI: soft, NT, (+)BS- less distended than last week Psychiatric: appropriate Neurological: Ox3  Musculoskeletal:     Cervical back: Neck supple. No tenderness.     Comments: UE strength 5-/5 in biceps, triceps, WE, grip and FA B/L RLE- HF 4/5; KE/KF 4/5 and DF 3-/5 and PF 4+/5 LLE- 5-/5 in same muscles Mild swelling of R hip/upper thigh and not wearing R ASO   Skin:    General: Skin is warm and dry.     Comments: Hip incision clean and dry with original surgical dressing in place.  Appropriately tender R ankle and anterior tibialis are bruised Raw spot on back of R ankle  Neurological:     Mental Status: She is alert and oriented to person, place, and time. Hard of hearing    Comments: Patient is alert and oriented x 3 and  follows commands.  Intact to light touch in all 4 extremities  Assessment/Plan: 1. Functional deficits which require 3+ hours per day of interdisciplinary therapy in a comprehensive inpatient rehab setting. Physiatrist is providing close team supervision and 24 hour management of active medical problems listed below. Physiatrist and rehab team continue to assess barriers to discharge/monitor patient progress toward functional and medical goals  Care Tool:  Bathing    Body parts bathed by patient: Right arm, Left arm, Chest, Abdomen, Front perineal area, Right upper leg, Left upper leg, Face   Body parts bathed by helper: Buttocks, Left lower leg, Right lower leg     Bathing assist Assist Level: Maximal Assistance - Patient 24 - 49%     Upper Body Dressing/Undressing Upper body dressing   What is the patient wearing?: Pull over shirt, Bra    Upper body assist Assist Level: Set up assist    Lower Body Dressing/Undressing Lower body dressing      What is the patient wearing?: Underwear/pull up, Pants     Lower body assist Assist for lower body dressing: Total Assistance -  Patient < 25%     Toileting Toileting    Toileting assist Assist for toileting: Moderate Assistance - Patient 50 - 74%     Transfers Chair/bed transfer  Transfers assist     Chair/bed transfer assist level: Minimal Assistance - Patient > 75%     Locomotion Ambulation   Ambulation assist      Assist level: Contact Guard/Touching assist Assistive device: Walker-rolling Max distance: 41f   Walk 10 feet activity   Assist     Assist level: Contact Guard/Touching assist Assistive device: Walker-rolling   Walk 50 feet activity   Assist    Assist level: Contact Guard/Touching assist Assistive device: Walker-rolling    Walk 150 feet activity   Assist    Assist level: Contact Guard/Touching assist Assistive device: Walker-rolling    Walk 10 feet on uneven surface   activity   Assist     Assist level: Contact Guard/Touching assist Assistive device: Walker-rolling   Wheelchair     Assist Is the patient using a wheelchair?: Yes Type of Wheelchair: Manual           Wheelchair 50 feet with 2 turns activity    Assist            Wheelchair 150 feet activity     Assist          Blood pressure (!) 111/50, pulse 70, temperature 98.1 F (36.7 C), resp. rate 16, height 5' 2"$  (1.575 m), weight 60.2 kg, SpO2 94 %.  Medical Problem List and Plan: 1. Functional deficits secondary to right femoral neck fracture after mechanical fall.  Status post right total hip arthroplasty anterior approach 11/05/2022.  Weightbearing as tolerated             -patient may  shower-cover incision             -ELOS/Goals: 10-14 days- supervision to CGA  Con't CIR PT and OT- wil determine length of stay tomorrow 2.  Antithrombotics: -DVT/anticoagulation:  Mechanical: Antiembolism stockings, thigh (TED hose) Bilateral lower extremities.  Check vascular study             -antiplatelet therapy: Aspirin 325 mg twice daily x 18 days then begin aspirin 81 mg daily for 3 weeks then discontinue 3. Pain Management: Hydrocodone/tramadol as needed.  Robaxin as needed for muscle spasms- recommended to take tylenol or tramadol before therapy sessions.   2/9- asked pt to take tylenol before therapy- can ask for tramadol if tylenol "not enough". Let nursing know  2/11 continue prn tylenol, hydrocodone, tramadol prn for pain  2/12- mainly taking tylenol or Norco- but occ Norco 4. Mood/Behavior/Sleep: Xanax 0.25 mg twice daily as needed .Provide emotional support             -antipsychotic agents: N/A 5. Neuropsych/cognition: This patient is capable of making decisions on her own behalf. 6. Skin/Wound Care: Routine skin checks 7. Fluids/Electrolytes/Nutrition: Routine in and outs with follow-up chemistries  2/9- labs look good 8.  Right ankle avulsion fracture.  ASO  ankle brace applied.  Weightbearing as tolerated  -2/11 Per prior ortho note ASO when needed when ambulating, will adjust orders  2/12- can use prn- per orders- will wait to call Ortho 9.  Acute blood loss anemia.  Follow-up CBC  2/9- Hb 8.3- stable- con't to monitor 10.  Hypertension.  Low-dose lisinopril 5 mg daily 2/11 decrease lisinopril to 2.5 mg  2/12- BP a little on low side- but asymptomatic    11/13/2022  5:11 AM 11/12/2022    7:26 PM 11/12/2022    1:03 PM  Vitals with BMI  Systolic [id number]  Diastolic 50 60 70  Pulse 70 83 73    11.  Hypothyroidism.  Synthroid 12. Insomnia- will add Trazodone 25-50 mg QHS for sleep prn  2/9- slept somewhat better- con't regimen  2/11 will increase trazodone to 52m 13. Constipation- rare Bms- if no BM by tomorrow, will give Sorbitol and soap suds enema if necessary to clean her out.  - also con't benefiber, 2-3x/day- her home dose.   2/9- will give Sorbitol after therapy today 30 cc to get her cleaned out. If need be, add soap suds enema.   2/11 LBM today, improved  2/12- LBM x2 yesterday  14. Urinary urgency- hopefully trazodone and Anticholinergic effects help her with urinary urgency; if not, will need to check U/A and Cx and maybe if that's negative, use Myrbetriq?  2/9- doing slightly better- wait on U/A etc for now       LOS: 4 days A FACE TO FACE EVALUATION WAS PERFORMED  Dominique Livingston 11/13/2022, 7:59 AM

## 2022-11-13 NOTE — Progress Notes (Signed)
Occupational Therapy Session Note  Patient Details  Name: Dominique Livingston MRN: [id number] Date of Birth: March 18, 1933  Today's Date: 11/13/2022 OT Individual Time: 1005-1104 OT Individual Time Calculation (min): 59 min   Short Term Goals: Week 1:  OT Short Term Goal 1 (Week 1): STG=LTG's d/t LOS  Skilled Therapeutic Interventions/Progress Updates:  Pt received sitting in recliner for skilled OT session with focus on BADL retraining, functional transfers, and DME recs. Pt agreeable to interventions, demonstrating overall pleasant mood. Pt with un-rated pain, stating "it's during the first few steps that I feel like it's going to give out on me" in reference to R-leg during walking. OT offering intermediate rest breaks and positioning suggestions throughout session to address pain/fatigue and maximize participation/safety in session.   Pt performs 3/3 toileting activities with supervision + grab bar. Standing at sink-side without AD, pt completes oral care with supervision for standing balance. Pt ambulates to all therapy locations with CGA-supervision and no AD.  In simulated bathroom environment, pt performs tub transfer using TTB and stepping over tub. Pt performs TTB transfer with supervision, requiring Min A for second trial for clearance of R-leg when stepping over. Pt and OT discuss further tub equipment such as removable shower head and suction-cup grab bars, pt provided with handouts of TTB/shower head.   Pt sharing her tennis-shoes are ~1/2 size too big, OT donning grip-socks over TEDs to increase foot security, and providing further education on fall prevention.   Pt remained siting in recliner with all immediate needs met at end of session. Pt continues to be appropriate for skilled OT intervention to promote further functional independence.   Therapy Documentation Precautions:  Precautions Precautions: Fall Required Braces or Orthoses: Other Brace Other Brace: R ankle  ASO Restrictions Weight Bearing Restrictions: Yes RLE Weight Bearing: Weight bearing as tolerated LLE Weight Bearing: Weight bearing as tolerated Other Position/Activity Restrictions: WBAT  Therapy/Group: Individual Therapy  Maudie Mercury, OTR/L, MSOT  11/13/2022, 7:59 AM

## 2022-11-14 DIAGNOSIS — S82891D Other fracture of right lower leg, subsequent encounter for closed fracture with routine healing: Secondary | ICD-10-CM | POA: Diagnosis not present

## 2022-11-14 DIAGNOSIS — S72001D Fracture of unspecified part of neck of right femur, subsequent encounter for closed fracture with routine healing: Secondary | ICD-10-CM | POA: Diagnosis not present

## 2022-11-14 MED ORDER — METHOCARBAMOL 500 MG PO TABS: 500.0000 mg | ORAL_TABLET | Freq: Four times a day (QID) | ORAL | 0 refills | Status: DC | PRN

## 2022-11-14 MED ORDER — LISINOPRIL 2.5 MG PO TABS: 2.5000 mg | ORAL_TABLET | Freq: Every day | ORAL | 0 refills | Status: AC

## 2022-11-14 MED ORDER — TRAMADOL HCL 50 MG PO TABS: 50.0000 mg | ORAL_TABLET | Freq: Four times a day (QID) | ORAL | 0 refills | Status: DC | PRN

## 2022-11-14 MED ORDER — ACETAMINOPHEN 325 MG PO TABS: 325.0000 mg | ORAL_TABLET | Freq: Four times a day (QID) | ORAL | Status: AC | PRN

## 2022-11-14 MED ORDER — TRAZODONE HCL 150 MG PO TABS: 75.0000 mg | ORAL_TABLET | Freq: Every evening | ORAL | 0 refills | Status: DC | PRN

## 2022-11-14 MED ORDER — ASPIRIN 325 MG PO TBEC: 325.0000 mg | DELAYED_RELEASE_TABLET | Freq: Two times a day (BID) | ORAL | 0 refills | Status: AC

## 2022-11-14 MED ORDER — DOCUSATE SODIUM 100 MG PO CAPS: 100.0000 mg | ORAL_CAPSULE | Freq: Two times a day (BID) | ORAL | 0 refills | Status: AC

## 2022-11-14 MED ORDER — HYDROCERIN EX CREA
TOPICAL_CREAM | Freq: Two times a day (BID) | CUTANEOUS | Status: DC
  Filled 2022-11-14: qty 113

## 2022-11-14 MED ORDER — ALPRAZOLAM 0.25 MG PO TABS: 0.2500 mg | ORAL_TABLET | Freq: Two times a day (BID) | ORAL | 0 refills | Status: DC | PRN

## 2022-11-14 NOTE — Progress Notes (Signed)
Inpatient Rehabilitation Care Coordinator Discharge Note   Patient Details  Name: Dominique Livingston MRN: [id number] Date of Birth: [birth year]   Discharge location: HOME WITH DAUGHTER COMING BY TO CHECK AND PROVIDING TRANSPORTATION  Length of Stay: 7 DAYS  Discharge activity level: INDEPENDENT LEVEL  Home/community participation: ACTIVE  Patient response [id number] Literacy - How often do you need to have someone help you when you read instructions, pamphlets, or other written material from your doctor or pharmacy?: Never  Patient response [id number] Isolation - How often do you feel lonely or isolated from those around you?: Never  Services provided included: MD, RD, PT, OT, RN, CM, TR, Pharmacy, SW  Financial Services:  Financial Services Utilized: Medicare    Choices offered to/list presented to: PT AND DAUGHTER  Follow-up services arranged:  DME, Patient/Family has no preference for HH/DME agencies      DME : [facility]- 3 IN 1  DAUGHTER TO GET [place] AMBULATING 900 FEET INDEPENDENTLY    Patient response to transportation need: Is the patient able to respond to transportation needs?: Yes In the past 12 months, has lack of transportation kept you from medical appointments or from getting medications?: No In the past 12 months, has lack of transportation kept you from meetings, work, or from getting things needed for daily living?: No    Comments (or additional information):DAUGHTER WAS HERE FOR PT SESSION AND SAW HOW WELL PT IS DOING. BOTH COMFORTABLE WITH GOING HOME.  Patient/Family verbalized understanding of follow-up arrangements:  Yes  Individual responsible for coordination of the follow-up plan: [facility] [phone]  Confirmed correct DME delivered: Elease Hashimoto 11/14/2022    Sharese Manrique, Gardiner Rhyme

## 2022-11-14 NOTE — Progress Notes (Signed)
Physical Therapy Session Note  Patient Details  Name: [name] MRN: [id number] Date of Birth: [birth year]  Today's Date: 11/14/2022 PT Individual Time: 1115-1200 PT Individual Time Calculation (min): 45 min   Short Term Goals: Week 1:  PT Short Term Goal 1 (Week 1): Pt wil ambulate 150 ft Mod I PT Short Term Goal 2 (Week 1): Pt will decressed TUG by 5 seconds PT Short Term Goal 3 (Week 1): Pt will be Mod I for transfers  Skilled Therapeutic Interventions/Progress Updates: Pt presents sitting in w/c and agreeable to therapy.  Pt transfers sit to stand w/ supervision to Mod I.  Pt amb > 150' w/o AD and supervision to mod I to main gym.  Pt performed 5XSTS in 20 seconds.  Discussed meaning of testing and results w/ min risk of falls (>15 seconds).  Pt amb x >100' w/o AD to small gym w/ supervision to Mod I.  Pt requires verbal cues for visual scanning and for depressed R shoulder w/ gait.  Pt performed sidestepping right and left w/supervision.  Pt negotiated obstacle course w/ stepping on 3 3/4" platform (alternating feet), stepping over canes, stepping on balance disk and up ramp and over mulch.  PT then added tapping cones w/ alternating feet in middle of course.  Pt w/ unsteadiness on disk and SLS on R for tapping cones, requiring assist from PT.  Educated on safe performance maintaining balance w/ R foot placement.  Pt amb > 300' w/o AD to return to room, remained sitting in recliner w/ seat alarm on and all needs in reach.     Therapy Documentation Precautions:  Precautions Precautions: Fall Required Braces or Orthoses: Other Brace Other Brace: R ankle ASO Restrictions Weight Bearing Restrictions: Yes RLE Weight Bearing: Weight bearing as tolerated LLE Weight Bearing: Weight bearing as tolerated Other Position/Activity Restrictions: WBAT General:   Vital Signs:  Pain:3/10 Pain Assessment Pain Score: 0-No pain Faces Pain Scale: No hurt   Therapy/Group: Individual  Therapy  Ladoris Gene 11/14/2022, 12:07 PM

## 2022-11-14 NOTE — Discharge Summary (Signed)
Physician Discharge Summary  [name] MRN: [id number] DOB/AGE: 1933/03/05 89 y.o.  Admit date: 11/09/2022 Discharge date: 11/15/2022  Discharge Diagnoses:  Principal Problem:   Femoral neck fracture ([place]) Active Problems:   Essential hypertension   History of polymyalgia rheumatica   Avulsion fracture of right ankle   Drug-induced constipation   Right hip pain DVT prophylaxis Acute blood loss anemia Hypothyroidism Constipation IBS Endometriosis Polymyalgia rheumatica  Discharged Condition: Stable  Significant Diagnostic Studies: VAS [place] LOWER EXTREMITY VENOUS (DVT)  Result Date: 11/10/2022  Lower Venous DVT Study Patient Name:  Dominique Livingston  Date of Exam:   11/09/2022 Medical Rec #: [id number]         Accession #:    [id number] Date of Birth: [birth year]         Patient Gender: F Patient Age:   51 years Exam Location:  [facility] Procedure:      VAS [place] LOWER EXTREMITY VENOUS (DVT) Referring Phys: Lauraine Rinne --------------------------------------------------------------------------------  Indications: Swelling.  Risk Factors: Surgery Trauma. Comparison Study: No prior studies. Performing Technologist: Oliver Hum RVT  Examination Guidelines: A complete evaluation includes B-mode imaging, spectral Doppler, color Doppler, and power Doppler as needed of all accessible portions of each vessel. Bilateral testing is considered an integral part of a complete examination. Limited examinations for reoccurring indications may be performed as noted. The reflux portion of the exam is performed with the patient in reverse Trendelenburg.  +---------+---------------+---------+-----------+----------+--------------+ RIGHT    CompressibilityPhasicitySpontaneityPropertiesThrombus Aging +---------+---------------+---------+-----------+----------+--------------+ CFV      Full           Yes      Yes                                  +---------+---------------+---------+-----------+----------+--------------+ SFJ      Full                                                        +---------+---------------+---------+-----------+----------+--------------+ FV Prox  Full                                                        +---------+---------------+---------+-----------+----------+--------------+ FV Mid   Full                                                        +---------+---------------+---------+-----------+----------+--------------+ FV DistalFull                                                        +---------+---------------+---------+-----------+----------+--------------+ PFV      Full                                                        +---------+---------------+---------+-----------+----------+--------------+  POP      Full           Yes      Yes                                 +---------+---------------+---------+-----------+----------+--------------+ PTV      Full                                                        +---------+---------------+---------+-----------+----------+--------------+ PERO     Full                                                        +---------+---------------+---------+-----------+----------+--------------+   +---------+---------------+---------+-----------+----------+--------------+ LEFT     CompressibilityPhasicitySpontaneityPropertiesThrombus Aging +---------+---------------+---------+-----------+----------+--------------+ CFV      Full           Yes      Yes                                 +---------+---------------+---------+-----------+----------+--------------+ SFJ      Full                                                        +---------+---------------+---------+-----------+----------+--------------+ FV Prox  Full                                                         +---------+---------------+---------+-----------+----------+--------------+ FV Mid   Full                                                        +---------+---------------+---------+-----------+----------+--------------+ FV DistalFull                                                        +---------+---------------+---------+-----------+----------+--------------+ PFV      Full                                                        +---------+---------------+---------+-----------+----------+--------------+ POP      Full           Yes      Yes                                 +---------+---------------+---------+-----------+----------+--------------+  PTV      Full                                                        +---------+---------------+---------+-----------+----------+--------------+ PERO     Full                                                        +---------+---------------+---------+-----------+----------+--------------+     Summary: RIGHT: - There is no evidence of deep vein thrombosis in the lower extremity.  - No cystic structure found in the popliteal fossa.  LEFT: - There is no evidence of deep vein thrombosis in the lower extremity.  - No cystic structure found in the popliteal fossa.  *See table(s) above for measurements and observations. Electronically signed by Orlie Pollen on 11/10/2022 at 1:36:06 PM.    Final    Pelvis Portable  Result Date: 11/05/2022 CLINICAL DATA:  Right femoral neck fracture status post arthroplasty EXAM: PORTABLE PELVIS 1-2 VIEWS COMPARISON:  11/03/2022 FINDINGS: Single frontal view of the pelvis includes both hips. Right hip arthroplasty is identified in the expected position without evidence of acute complication. Postsurgical changes are seen within the soft tissues overlying the right hip. Stable mild left hip osteoarthritis. No acute fractures. IMPRESSION: 1. Unremarkable right hip arthroplasty. Electronically Signed   By:  Randa Ngo M.D.   On: 11/05/2022 16:14   DG HIP UNILAT WITH PELVIS 1V RIGHT  Result Date: 11/05/2022 CLINICAL DATA:  Status post right hip arthroplasty. EXAM: DG HIP (WITH OR WITHOUT PELVIS) 1V RIGHT COMPARISON:  11/03/2022 FINDINGS: Two images obtained via portable C-arm radiography in the operating room were submitted. There is been interval right total hip arthroplasty. Hardware components are in anatomic alignment. Surgical drainage catheter and surgical sponge are identified in lateral to right hip. IMPRESSION: Status post right total hip arthroplasty. Surgical drainage catheter and sponge noted lateral to the right hip. Electronically Signed   By: Kerby Moors M.D.   On: 11/05/2022 09:33   DG C-Arm 1-60 Min-No Report  Result Date: 11/05/2022 Fluoroscopy was utilized by the requesting physician.  No radiographic interpretation.   DG C-Arm 1-60 Min-No Report  Result Date: 11/05/2022 Fluoroscopy was utilized by the requesting physician.  No radiographic interpretation.   DG Hip Unilat With Pelvis 2-3 Views Right  Addendum Date: 11/04/2022   ADDENDUM REPORT: 11/04/2022 00:46 ADDENDUM: Right ankle: Bony densities are noted inferior to the lateral malleolus suggesting avulsion fractures of indeterminate age. No dislocation. Electronically Signed   By: Brett Fairy M.D.   On: 11/04/2022 00:46   Result Date: 11/04/2022 CLINICAL DATA:  Fall, hip injury. EXAM: RIGHT ANKLE - COMPLETE 3+ VIEW; DG HIP (WITH OR WITHOUT PELVIS) 2-3V RIGHT COMPARISON:  None Available. FINDINGS: There is a fracture involving the femoral neck on the right with superior subluxation of the distal fracture fragment. No dislocation. The remaining bony structures are intact. The soft tissues are unremarkable. IMPRESSION: Mildly displaced femoral neck fracture on the right. Electronically Signed: By: Brett Fairy M.D. On: 11/03/2022 23:51   DG Ankle Complete Right  Addendum Date: 11/04/2022   ADDENDUM REPORT: 11/04/2022 00:46  ADDENDUM: Right ankle: Bony  densities are noted inferior to the lateral malleolus suggesting avulsion fractures of indeterminate age. No dislocation. Electronically Signed   By: Brett Fairy M.D.   On: 11/04/2022 00:46   Result Date: 11/04/2022 CLINICAL DATA:  Fall, hip injury. EXAM: RIGHT ANKLE - COMPLETE 3+ VIEW; DG HIP (WITH OR WITHOUT PELVIS) 2-3V RIGHT COMPARISON:  None Available. FINDINGS: There is a fracture involving the femoral neck on the right with superior subluxation of the distal fracture fragment. No dislocation. The remaining bony structures are intact. The soft tissues are unremarkable. IMPRESSION: Mildly displaced femoral neck fracture on the right. Electronically Signed: By: Brett Fairy M.D. On: 11/03/2022 23:51   DG Chest Portable 1 View  Result Date: 11/03/2022 CLINICAL DATA:  [place], hip fracture, preoperative evaluation EXAM: PORTABLE CHEST 1 VIEW COMPARISON:  08/15/2013 FINDINGS: Single frontal view of the chest demonstrates an unremarkable cardiac silhouette. No airspace disease, effusion, or pneumothorax. No acute bony abnormality. IMPRESSION: 1. No acute intrathoracic process. Electronically Signed   By: Randa Ngo M.D.   On: 11/03/2022 23:45    Labs:  Basic Metabolic Panel: Recent Labs  Lab 11/10/22 0523  NA 136  K 4.1  CL 99  CO2 27  GLUCOSE 104*  BUN 23  CREATININE 0.86  CALCIUM 8.6*    CBC: Recent Labs  Lab 11/10/22 0523  WBC 8.4  NEUTROABS 5.1  HGB 8.3*  HCT 26.0*  MCV 95.9  PLT 228    CBG: No results for input(s): "GLUCAP" in the last 168 hours.  Brief HPI:   Dominique Livingston is a 89 y.o. right-handed female with history of hypertension endometriosis status post remote right hemicolectomy IBS hyperlipidemia polymyalgia rheumatica.  Per chart review lives alone independent prior to admission.  Presented 11/03/2022 after mechanical fall at home after she tripped on the carpet.  Denied loss of consciousness.  Patient notes she was on the floor x 1  hour and unable to reach the phone.  Admission chemistries unremarkable except glucose 144 WBC 11,400 hemoglobin 11.1.  X-rays and imaging revealed right femoral neck fracture.  Underwent right total hip arthroplasty, anterior approach 11/05/2022 per Dr.Alusio.  Patient weightbearing as tolerated.  Patient also sustained right ankle avulsion fracture conservative care with ASO brace applied.  Hospital course acute blood loss anemia 8.3 and monitored.  Maintained on aspirin 325 mg twice daily for DVT prophylaxis.  Blood pressure soft maintained on low-dose lisinopril.  Therapy evaluations completed due to patient's decreased functional mobility was admitted for a comprehensive rehab program.   Hospital Course: Dominique Livingston was admitted to rehab 11/09/2022 for inpatient therapies to consist of PT, ST and OT at least three hours five days a week. Past admission physiatrist, therapy team and rehab RN have worked together to provide customized collaborative inpatient rehab.  Pertaining to patient's right femoral neck fracture after mechanical fall.  Status post right total hip arthroplasty anterior approach 11/05/2022.  Weightbearing as tolerated.  Neurovascular sensation intact.  She would follow-up with orthopedic services.  DVT prophylaxis with aspirin 325 mg twice daily per protocol then begin aspirin 81 mg daily for 3 weeks then discontinue.  Venous Doppler studies negative.  Pain management the use of Robaxin as needed for muscle spasm as well as hydrocodone/tramadol.  Blood pressure controlled on low-dose lisinopril will need outpatient follow-up.  Hypothyroidism with Synthroid as directed.  Bouts of constipation resolved with laxative assistance.   Blood pressures were monitored on TID basis and soft and monitored  Rehab course: During patient's stay in rehab weekly team conferences were held to monitor patient's progress, set goals and discuss barriers to discharge. At admission, patient required  minimal guard 80 feet rolling walker minimal guard sit to stand  Physical exam.  Blood pressure 132/54 pulse 80 temperature 98.7 respirations 18 oxygen saturations 100% room air Constitutional.  No acute distress.  Hard of hearing HEENT Head.  Normocephalic and atraumatic Eyes.  Pupils round and reactive to light no discharge without nystagmus Neck.  Supple nontender no JVD without thyromegaly Cardiac regular rate and rhythm without any extra sounds or murmur heard Abdomen.  Soft nontender positive bowel sounds without rebound Respiratory effort normal no respiratory distress without wheeze Musculoskeletal. Upper extremities 5 -/5 in biceps triceps, W EE, grip and FA B/L Right lower extremity hip flexors 4/5 KE/KF 4/5 in DF 3 -/5 PF 4+/5 Left lower extremity 5 -/5 and same muscles Right ankle brace in place Skin.  Hip incision clean and dry well-approximated  He/She  has had improvement in activity tolerance, balance, postural control as well as ability to compensate for deficits. He/She has had improvement in functional use RUE/LUE  and RLE/LLE as well as improvement in awareness.  Supervision for safety.  Ambulates 150 feet to the main gym standby assist.  Navigate stairs with supervision.  Modified independent bed mobility contact-guard lower body ADLs.  Full family teaching completed plan discharge to home       Disposition: Discharge to home   Diet: Regular  Special Instructions: No driving smoking or alcohol  Weightbearing as tolerated right lower extremity with ASO brace  Medications at discharge. 1.  Tylenol as needed 2.  Xanax 0.25 mg p.o. twice daily as needed anxiety 3.  Aspirin 325 mg p.o. twice daily x 2 more days then begin aspirin 81 mg daily x 3 weeks and stop 4.  Colace 100 mg p.o. twice daily hold for loose stools 5.  Synthroid 75 mcg p.o. daily 6.  Lisinopril 2.5 mg p.o. daily 7.  Robaxin 500 mg p.o. every 6 hours as needed muscle spasms 8.  Multivitamin  daily 9.  Tramadol 50-100 mg every 6 hours as needed pain 10.  Trazodone 75 mg nightly as needed sleep 11.Miralax daily as needed 12.Psyllium one packet po BID   30-35 minutes were spent completing discharge summary and discharge planning    Follow-up Information     Lovorn, Aundra Millet, MD Follow up.   Specialty: Physical Medicine and Rehabilitation Why: No formal follow-up needed Contact information: [address] [place] [address] [phone]         Ollen Gross, MD Follow up.   Specialty: Orthopedic Surgery Why: Call for appointment Contact information: [address] [place] [place] [address] [phone]                 Signed: Charlton Amor 11/15/2022, 5:18 AM

## 2022-11-14 NOTE — Progress Notes (Signed)
Occupational Therapy Discharge Summary  Patient Details  Name: Dominique Livingston MRN: [id number] Date of Birth: September 09, 1933  Date of Discharge from OT service:November 14, 2022  Today's Date: 11/14/2022 OT Individual Time: 1350-1445 OT Individual Time Calculation (min): 55 min    Patient has met 11 of 11 long term goals due to improved activity tolerance, improved balance, ability to compensate for deficits, and decreased pain .  Patient to discharge at overall Modified Independent level.  Patient's care partner is independent to provide the necessary assistance at discharge for initial transition home.   Reasons goals not met: Pt discharging with IADL goals at "adequate for discharge" level, but pt's daughter to be present to assist with meal prepping, laundry, and housekeeping.   Recommendation:  None  Equipment: 3 in 1 BSC, TTB, reacher, and sock aid  Reasons for discharge: treatment goals met and discharge from hospital  Patient/family agrees with progress made and goals achieved: Yes  OT Discharge Precautions/Restrictions  Precautions Precautions: Fall Restrictions Weight Bearing Restrictions: Yes RLE Weight Bearing: Weight bearing as tolerated Pain Pain Assessment Pain Scale: 0-10 Pain Score: 0-No pain ADL ADL Equipment Provided: Long-handled sponge Eating: Independent Where Assessed-Eating: Edge of bed Grooming: Independent Where Assessed-Grooming: Standing at sink Upper Body Bathing: Modified independent Where Assessed-Upper Body Bathing: Shower Lower Body Bathing: Modified independent Where Assessed-Lower Body Bathing: Shower Upper Body Dressing: Independent Where Assessed-Upper Body Dressing: Edge of bed Lower Body Dressing: Modified independent Where Assessed-Lower Body Dressing: Edge of bed Toileting: Modified independent Where Assessed-Toileting: Glass blower/designer: Diplomatic Services operational officer Method: Counselling psychologist:  Energy manager Method: Unable to Education officer, environmental Method: Heritage manager: Radio broadcast assistant ADL Comments: UB and grooming set up, LB max A, bathroom transfers min A amb with RW Vision Baseline Vision/History: 0 No visual deficits Patient Visual Report: No change from baseline Vision Assessment?: No apparent visual deficits Perception  Perception: Within Functional Limits Praxis Praxis: Intact Cognition Cognition Overall Cognitive Status: Within Functional Limits for tasks assessed Arousal/Alertness: Awake/alert Orientation Level: Person;Place;Situation Memory: Appears intact Awareness: Appears intact Problem Solving: Appears intact Safety/Judgment: Appears intact Brief Interview for Mental Status (BIMS) Repetition of Three Words (First Attempt): 3 Temporal Orientation: Year: Correct Temporal Orientation: Month: Accurate within 5 days Temporal Orientation: Day: Correct Recall: "Sock": Yes, no cue required Recall: "Blue": Yes, no cue required Recall: "Bed": Yes, no cue required BIMS Summary Score: 15 Sensation Sensation Light Touch: Appears Intact Hot/Cold: Appears Intact Proprioception: Appears Intact Coordination Gross Motor Movements are Fluid and Coordinated: Yes Fine Motor Movements are Fluid and Coordinated: Yes Motor  Motor Motor: Within Functional Limits Mobility  Transfers Sit to Stand: Independent Stand to Sit: Independent  Trunk/Postural Assessment  Cervical Assessment Cervical Assessment: Within Functional Limits Thoracic Assessment Thoracic Assessment: Within Functional Limits Lumbar Assessment Lumbar Assessment: Within Functional Limits Postural Control Postural Control: Within Functional Limits  Balance Balance Balance Assessed: Yes Static Sitting Balance Static Sitting - Balance Support: Feet supported Static Sitting - Level of Assistance: 7:  Independent Dynamic Sitting Balance Dynamic Sitting - Level of Assistance: 7: Independent Dynamic Sitting - Balance Activities: Reaching for objects;Forward lean/weight shifting;Lateral lean/weight shifting Static Standing Balance Static Standing - Balance Support: During functional activity Static Standing - Level of Assistance: 7: Independent Dynamic Standing Balance Dynamic Standing - Balance Support: During functional activity Dynamic Standing - Level of Assistance: 6: Modified independent (Device/Increase time) Dynamic Standing - Balance Activities: Lateral lean/weight shifting;Reaching for  objects;Reaching across midline Extremity/Trunk Assessment RUE Assessment RUE Assessment: Within Functional Limits LUE Assessment LUE Assessment: Within Functional Limits  Skilled Intervention: Pt received resting in recliner with daughter present for skilled OT session with focus on discharge planning, DME recommendations, and community mobility. Pt agreeable to interventions, demonstrating overall pleasant mood. Pt with un-rated pain, stating "it just feels sore" in reference to R-hip pain. OT offering intermediate rest breaks and positioning suggestions throughout session to address pain/fatigue and maximize participation/safety in session.   Pt and OT discuss use/purpose of reacher and sock aid with handout provided. Pt's R-heel assessed for skin integrity since removal of ankle brace, padded bandage removed, and not other concerns voiced. RN updated.   OT answering remaining questions in regards activity tolerance, and transition to community and daily routines/roles. Pt, pt's daughter, and OT ambulating from room<>hospital gift shop, pt requiring overall supervision and no rest breaks.   Pt remained resting in bed with all immediate needs met at end of session. Pt continues to be appropriate for skilled OT intervention to promote further functional independence.    Maudie Mercury, OTR/L,  MSOT  11/14/2022, 3:58 PM

## 2022-11-14 NOTE — Progress Notes (Addendum)
Physical Therapy Session Note  Patient Details  Name: Dominique Livingston MRN: [id number] Date of Birth: 1932/11/05  Today's Date: 11/14/2022 PT Individual Time: [id number] PT Individual Time Calculation (min): 60 min   Short Term Goals: Week 1:  PT Short Term Goal 1 (Week 1): Pt wil ambulate 150 ft Mod I PT Short Term Goal 2 (Week 1): Pt will decressed TUG by 5 seconds PT Short Term Goal 3 (Week 1): Pt will be Mod I for transfers  Skilled Therapeutic Interventions/Progress Updates:      Therapy Documentation Precautions:  Precautions Precautions: Fall Required Braces or Orthoses: Other Brace Other Brace: R ankle ASO Restrictions Weight Bearing Restrictions: Yes RLE Weight Bearing: Weight bearing as tolerated LLE Weight Bearing: Weight bearing as tolerated Other Position/Activity Restrictions: WBAT  Pt received seated in w/c at bedside, OT present for handoff. Pt reports new rash on upper back and nursing inspected.   Pt declines pain and reports mild LE soreness from previous therapy sessions.   Pt agreeable to PT and requires supervision with sit to stand and gait no AD to dayroom.   Pt participated in LE warm up to address ROM and strength deficits. Pt performed 2 x 10 of standing hip 3 ways (flexion, abduction, extension) with R LE.   PT provided pt with HEP for AM flexibility exercises to warm up prior to mobility upon discharge:   -seated hamstring stretch 1 x 3 with 1 min hold   -seated adductor stretch 1 x 3 with 1 min hold   -standing alternating heel raises 1 x 10   6 Min Walk Test:  Instructed patient to ambulate as quickly and as safely as possible for 6 minutes using LRAD. Patient was allowed to take standing rest breaks without stopping the test, but if the patient required a sitting rest break the clock would be stopped and the test would be over.  Results: 907 feet (276 meters, Avg speed 0.8 m/s) using no AD. Results indicate that the patient has reduced  endurance with ambulation compared to age matched norms.  Age Matched Norms: 51-69 yo M: 8 F: 8, 52-79 yo M: 3 F: 62, 2-89 yo M: 64 F: 392 MDC: 58.21 meters (190.98 feet) or 50 meters (ANPTA Core Set of Outcome Measures for Adults with Neurologic Conditions, 2018)  Pt ambulated to room (S) no AD and left seated in w/c at bedside with chair alarm on and all needs within reach.    Access Code: [email or web address] URL: [email or web address] Date: 11/14/2022 Prepared by: Verl Dicker  Exercises - Seated Hamstring Stretch  - 1 x daily - 7 x weekly - 1 sets - 3 reps - 30 s hold - Standing Heel Raises  - 1 x daily - 7 x weekly - 1 sets - 10 reps - Seated Hip Adductor Stretch  - 1 x daily - 7 x weekly - 3 sets - 10 reps   Therapy/Group: Individual Therapy  Verl Dicker Verl Dicker PT, DPT  11/14/2022, 7:40 AM

## 2022-11-14 NOTE — Progress Notes (Signed)
PROGRESS NOTE   Subjective/Complaints:  Sleeping better on Trazodone 75 mg QHS.  LBM early AM yesterday- still feels a little constipated- chronic issue.   Pain controlled with Norco- hasn't tried Tramadol.    ROS:  Pt denies SOB, abd pain, CP, N/V/C/D, and vision changes  Except for HPI   Objective:   No results found. No results for input(s): "WBC", "HGB", "HCT", "PLT" in the last 72 hours. No results for input(s): "NA", "K", "CL", "CO2", "GLUCOSE", "BUN", "CREATININE", "CALCIUM" in the last 72 hours.  Intake/Output Summary (Last 24 hours) at 11/14/2022 0844 Last data filed at 11/14/2022 0842 Gross per 24 hour  Intake 1476 ml  Output --  Net 1476 ml        Physical Exam: Vital Signs Blood pressure (!) 120/56, pulse 71, temperature 98.7 F (37.1 C), resp. rate 16, height 5' 2"$  (1.575 m), weight 60.2 kg, SpO2 94 %.      General: awake, alert, appropriate, sitting up in bed; NAD HENT: conjugate gaze; oropharynx moist CV: regular rate and rhythm; ; no JVD Pulmonary: CTA B/L; no W/R/R- good air movement GI: soft, NT, ND, (+)BS- slightly hypoactive Psychiatric: appropriate- interactive Neurological: Ox3 Extremities; No LE edema B/L  Musculoskeletal:     Cervical back: Neck supple. No tenderness.     Comments: UE strength 5-/5 in biceps, triceps, WE, grip and FA B/L RLE- HF 4/5; KE/KF 4/5 and DF 3-/5 and PF 4+/5 LLE- 5-/5 in same muscles Mild swelling of R hip/upper thigh and not wearing R ASO   Skin:    General: Skin is warm and dry.     Comments: Hip incision clean and dry with original surgical dressing in place. No change- less swelling of R hip  Appropriately tender R ankle and anterior tibialis are bruised Raw spot on back of R ankle  Neurological:     Mental Status: She is alert and oriented to person, place, and time. Hard of hearing    Comments: Patient is alert and oriented x 3 and follows  commands.  Intact to light touch in all 4 extremities  Assessment/Plan: 1. Functional deficits which require 3+ hours per day of interdisciplinary therapy in a comprehensive inpatient rehab setting. Physiatrist is providing close team supervision and 24 hour management of active medical problems listed below. Physiatrist and rehab team continue to assess barriers to discharge/monitor patient progress toward functional and medical goals  Care Tool:  Bathing    Body parts bathed by patient: Right arm, Left arm, Chest, Abdomen, Front perineal area, Right upper leg, Left upper leg, Face   Body parts bathed by helper: Buttocks, Left lower leg, Right lower leg     Bathing assist Assist Level: Maximal Assistance - Patient 24 - 49%     Upper Body Dressing/Undressing Upper body dressing   What is the patient wearing?: Pull over shirt, Bra    Upper body assist Assist Level: Set up assist    Lower Body Dressing/Undressing Lower body dressing      What is the patient wearing?: Underwear/pull up, Pants     Lower body assist Assist for lower body dressing: Total Assistance - Patient < 25%  Toileting Toileting    Toileting assist Assist for toileting: Moderate Assistance - Patient 50 - 74%     Transfers Chair/bed transfer  Transfers assist     Chair/bed transfer assist level: Minimal Assistance - Patient > 75%     Locomotion Ambulation   Ambulation assist      Assist level: Contact Guard/Touching assist Assistive device: Walker-rolling Max distance: 60f   Walk 10 feet activity   Assist     Assist level: Contact Guard/Touching assist Assistive device: Walker-rolling   Walk 50 feet activity   Assist    Assist level: Contact Guard/Touching assist Assistive device: Walker-rolling    Walk 150 feet activity   Assist    Assist level: Contact Guard/Touching assist Assistive device: Walker-rolling    Walk 10 feet on uneven surface   activity   Assist     Assist level: Contact Guard/Touching assist Assistive device: Walker-rolling   Wheelchair     Assist Is the patient using a wheelchair?: Yes Type of Wheelchair: Manual           Wheelchair 50 feet with 2 turns activity    Assist        Assist Level: Dependent - Patient 0% (Per Eval)   Wheelchair 150 feet activity     Assist      Assist Level: Dependent - Patient 0% (Per Eval)   Blood pressure (!) 120/56, pulse 71, temperature 98.7 F (37.1 C), resp. rate 16, height 5' 2"$  (1.575 m), weight 60.2 kg, SpO2 94 %.  Medical Problem List and Plan: 1. Functional deficits secondary to right femoral neck fracture after mechanical fall.  Status post right total hip arthroplasty anterior approach 11/05/2022.  Weightbearing as tolerated             -patient may  shower-cover incision             -ELOS/Goals: 10-14 days- [place] and [place] Team conference today to determine length of stay 2.  Antithrombotics: -DVT/anticoagulation:  Mechanical: Antiembolism stockings, thigh (TED hose) Bilateral lower extremities.  Check vascular study             -antiplatelet therapy: Aspirin 325 mg twice daily x 18 days then begin aspirin 81 mg daily for 3 weeks then discontinue 3. Pain Management: Hydrocodone/tramadol as needed.  Robaxin as needed for muscle spasms- recommended to take tylenol or tramadol before therapy sessions.   2/9- asked pt to take tylenol before therapy- can ask for tramadol if tylenol "not enough". Let nursing know  2/11 continue prn tylenol, hydrocodone, tramadol prn for pain  2/12- mainly taking tylenol or Norco- but occ Norco  2/13- wil ask nursing to try Tramadol for pain- if doesn't work (and informed pt) then go back to [employer] seeing if can wean some.  4. Mood/Behavior/Sleep: Xanax 0.25 mg twice daily as needed .Provide emotional support             -antipsychotic agents: N/A 5. Neuropsych/cognition: This  patient is capable of making decisions on her own behalf. 6. Skin/Wound Care: Routine skin checks 7. Fluids/Electrolytes/Nutrition: Routine in and outs with follow-up chemistries  2/9- labs look good 8.  Right ankle avulsion fracture.  ASO ankle brace applied.  Weightbearing as tolerated  -2/11 Per prior ortho note ASO when needed when ambulating, will adjust orders  2/12- can use prn- per orders- will wait to call Ortho 9.  Acute blood loss anemia.  Follow-up CBC  2/9- Hb 8.3- stable-  con't to monitor 10.  Hypertension.  Low-dose lisinopril 5 mg daily 2/11 decrease lisinopril to 2.5 mg  2/13- BP controlled- con't regimen    11/14/2022    5:19 AM 11/13/2022    8:17 PM 11/13/2022    2:21 PM  Vitals with BMI  Systolic [id number]  Diastolic 56 53 58  Pulse 71 77 72    11.  Hypothyroidism.  Synthroid 12. Insomnia- will add Trazodone 25-50 mg QHS for sleep prn  2/9- slept somewhat better- con't regimen  2/11 will increase trazodone to 70m 13. Constipation- rare Bms- if no BM by tomorrow, will give Sorbitol and soap suds enema if necessary to clean her out.  - also con't benefiber, 2-3x/day- her home dose.   2/9- will give Sorbitol after therapy today 30 cc to get her cleaned out. If need be, add soap suds enema.   2/11 LBM today, improved  2/12- LBM x2 yesterday  2/13- LBM yesterday early AM--still having constipation issues- showed metamucil "refused' but pt noted she didn't receive it.   14. Urinary urgency- hopefully trazodone and Anticholinergic effects help her with urinary urgency; if not, will need to check U/A and Cx and maybe if that's negative, use Myrbetriq?  2/9- doing slightly better- wait on U/A etc for now     I spent a total of  38  minutes on total care today- >50% coordination of care- due to  D/w nursing about tramadol vs norco- try Tramadol first- also team conference to determine length of stay   LOS: 5 days A FACE TO FACE EVALUATION WAS PERFORMED  Dominique Livingston 11/14/2022, 8:44 AM

## 2022-11-14 NOTE — Plan of Care (Signed)
  Problem: RH Balance Goal: LTG: Patient will maintain dynamic sitting balance (OT) Description: LTG:  Patient will maintain dynamic sitting balance with assistance during activities of daily living (OT) Outcome: Completed/Met Goal: LTG Patient will maintain dynamic standing with ADLs (OT) Description: LTG:  Patient will maintain dynamic standing balance with assist during activities of daily living (OT)  Outcome: Completed/Met   Problem: Sit to Stand Goal: LTG:  Patient will perform sit to stand in prep for activites of daily living with assistance level (OT) Description: LTG:  Patient will perform sit to stand in prep for activites of daily living with assistance level (OT) Outcome: Completed/Met   Problem: RH Grooming Goal: LTG Patient will perform grooming w/assist,cues/equip (OT) Description: LTG: Patient will perform grooming with assist, with/without cues using equipment (OT) Outcome: Completed/Met   Problem: RH Bathing Goal: LTG Patient will bathe all body parts with assist levels (OT) Description: LTG: Patient will bathe all body parts with assist levels (OT) Outcome: Completed/Met   Problem: RH Dressing Goal: LTG Patient will perform upper body dressing (OT) Description: LTG Patient will perform upper body dressing with assist, with/without cues (OT). Outcome: Completed/Met Goal: LTG Patient will perform lower body dressing w/assist (OT) Description: LTG: Patient will perform lower body dressing with assist, with/without cues in positioning using equipment (OT) Outcome: Completed/Met   Problem: RH Toileting Goal: LTG Patient will perform toileting task (3/3 steps) with assistance level (OT) Description: LTG: Patient will perform toileting task (3/3 steps) with assistance level (OT)  Outcome: Completed/Met   Problem: RH Simple Meal Prep Goal: LTG Patient will perform simple meal prep w/assist (OT) Description: LTG: Patient will perform simple meal prep with assistance,  with/without cues (OT). Outcome: Adequate for Discharge   Problem: RH Laundry Goal: LTG Patient will perform laundry w/assist, cues (OT) Description: LTG: Patient will perform laundry with assistance, with/without cues (OT). Outcome: Adequate for Discharge   Problem: RH Light Housekeeping Goal: LTG Patient will perform light housekeeping w/assist (OT) Description: LTG: Patient will perform light housekeeping with assistance, with/without cues (OT). Outcome: Adequate for Discharge   Problem: RH Toilet Transfers Goal: LTG Patient will perform toilet transfers w/assist (OT) Description: LTG: Patient will perform toilet transfers with assist, with/without cues using equipment (OT) Outcome: Completed/Met   Problem: RH Tub/Shower Transfers Goal: LTG Patient will perform tub/shower transfers w/assist (OT) Description: LTG: Patient will perform tub/shower transfers with assist, with/without cues using equipment (OT) Outcome: Completed/Met   Problem: RH Furniture Transfers Goal: LTG Patient will perform furniture transfers w/assist (OT/PT) Description: LTG: Patient will perform furniture transfers  with assistance (OT/PT). Outcome: Completed/Met

## 2022-11-14 NOTE — Progress Notes (Signed)
Occupational Therapy Session Note  Patient Details  Name: Dominique Livingston MRN: [id number] Date of Birth: 1933-03-13  Today's Date: 11/14/2022 OT Individual Time: [id number] OT Individual Time Calculation (min): 44 min    Short Term Goals: Week 1:  OT Short Term Goal 1 (Week 1): STG=LTG's d/t LOS  Skilled Therapeutic Interventions/Progress Updates:   Pt seen for skilled OT session with focus on progressing functional modified indep level for ADL's and bathroom mobility. Pt had progressed off AD yesterday with PT and care coord revealed OT to work on some warm-up therex prior to mobilizing pt without an AD. Pt sat EOB with mod I to perform breif R LE AROM and then was able to amb with CGA with mild antalgic gait and report of "morning stiffness" to toilet with grab bars only. Void only with set up for hygiene ( see Flowsheets for data). Amb with close S to sink side 10 ft. UB self care routine with mod I after set up seated. LB selfcare with offer of use of reacher but not needed as needed with close S except TED hose. Left pt w/c level with all safety needs, nurse call button and PT hand off.     Therapy Documentation Precautions:  Precautions Precautions: Fall Required Braces or Orthoses: Other Brace Other Brace: R ankle ASO Restrictions Weight Bearing Restrictions: Yes RLE Weight Bearing: Weight bearing as tolerated LLE Weight Bearing: Weight bearing as tolerated Other Position/Activity Restrictions: WBAT    Therapy/Group: Individual Therapy  Barnabas Lister 11/14/2022, 7:40 AM

## 2022-11-14 NOTE — Patient Care Conference (Addendum)
Inpatient RehabilitationTeam Conference and Plan of Care Update Date: 11/14/2022   Time:11:52 AM    Patient Name: Dominique Livingston      Medical Record Number: [id number]  Date of Birth: 06-22-1933 Sex: Female         Room/Bed: 4W26C/4W26C-01 Payor Info: Payor: MEDICARE / Plan: MEDICARE PART A AND B / Product Type: *No Product type* /    Admit Date/Time:  11/09/2022  1:58 PM  Primary Diagnosis:  Femoral neck fracture [facility])  Hospital Problems: Principal Problem:   Femoral neck fracture ([place]) Active Problems:   Essential hypertension   History of polymyalgia rheumatica   Avulsion fracture of right ankle   Drug-induced constipation   Right hip pain    Expected Discharge Date: Expected Discharge Date: 11/15/22  Team Members Present: Physician leading conference: Dr. Courtney Heys Social Worker Present: Ovidio Kin, LCSW Nurse Present: Tacy Learn, RN PT Present: Verl Dicker, PT OT Present: Other (comment) Maudie Mercury, OT) PPS Coordinator present : Gunnar Fusi, SLP     Current Status/Progress Goal Weekly Team Focus  Bowel/Bladder   Continent of B/B   Remain continent   Assist with toileting as needed    Swallow/Nutrition/ Hydration               ADL's   SUP for functional transfers, Mod I UB ADLs, CGA-SUP for LB ADLs   Mod I   IADLs, activity tolerance, general conditioning    Mobility   supervision bed, SBA gait x 240 no AD, supervision stairs   Mod I  dynamic gait, pt/fam education, activity tolerance    Communication                Safety/Cognition/ Behavioral Observations               Pain   C/O of stiffness to RLE, no prn medication needed   Pain <3/10   Assess Qshift and prn    Skin   RLE surgical incision, Remainder of skin intact   promote healing and prevention of infection.  Assess Qshift and prn      Discharge Planning:  Home with family members assisting, two children and niece will come into her home to  assist. Doing quite well.   Team Discussion: Femoral neck fracture. Continent B/B. Pain managed with PRN medications. Incision to right hip. OpSite dressing remains intact. WBAT to RLE. Prevalon boot to RLE. B/P medications adjusted. Therapies going well. Going home alone and needs to ModI at discharge.  Patient on target to meet rehab goals: yes, supervision for functional transfers. ModI UB ADLs. CGA/Supv for LB ADLs. Supervision bed mobility. SBA gait 240' no device. Supervision stairs  *See Care Plan and progress notes for long and short-term goals.   Revisions to Treatment Plan:  Medication adjustments  Teaching Needs: Medications, safety, self care, gait/transfer training, etc.   Current Barriers to Discharge: Decreased caregiver support  Possible Resolutions to Barriers: Family education, nursing education, order recommended DME     Medical Summary Current Status: continent B/B- meds before therapy- rash upper back-  Barriers to Discharge: Weight bearing restrictions;Self-care education;Medical stability  Barriers to Discharge Comments: chronic constipation- will see if can remove original op site; Possible Resolutions to [employer] Focus: higher cognition things to work on with OT- outpatient- d/c 2/18   Continued Need for Acute Rehabilitation Level of Care: The patient requires daily medical management by a physician with specialized training in physical medicine and rehabilitation for the following reasons: Direction of  a multidisciplinary physical rehabilitation program to maximize functional independence : Yes Medical management of patient stability for increased activity during participation in an intensive rehabilitation regime.: Yes Analysis of laboratory values and/or radiology reports with any subsequent need for medication adjustment and/or medical intervention. : Yes   I attest that I was present, lead the team conference, and concur with the assessment and  plan of the team.   Ernest Pine 11/14/2022, 4:18 PM

## 2022-11-14 NOTE — Progress Notes (Signed)
Physical Therapy Discharge Summary  Patient Details  Name: TANESHIA GUEL MRN: [id number] Date of Birth: 12-17-1932  Date of Discharge from PT service:November 14, 2022  Patient has met 12 of 12 long term goals due to improved activity tolerance, improved balance, improved postural control, increased strength, increased range of motion, decreased pain, ability to compensate for deficits, and improved coordination.  Patient to discharge at an ambulatory level Modified Independent.   Reasons goals not met: N/A   Equipment: No equipment provided  Reasons for discharge: treatment goals met and discharge from hospital  Patient/family agrees with progress made and goals achieved: Yes  PT Discharge Precautions/Restrictions Precautions Precautions: Fall Restrictions Weight Bearing Restrictions: Yes RLE Weight Bearing: Weight bearing as tolerated Pain Interference Pain Interference Pain Effect on Sleep: 1. Rarely or not at all Pain Interference with Therapy Activities: 1. Rarely or not at all Pain Interference with Day-to-Day Activities: 1. Rarely or not at all Vision/Perception  Vision - History Ability to See in Adequate Light: 0 Adequate Perception Perception: Within Functional Limits Praxis Praxis: Intact  Cognition Overall Cognitive Status: Within Functional Limits for tasks assessed Arousal/Alertness: Awake/alert Orientation Level: Oriented X4 Memory: Appears intact Awareness: Appears intact Problem Solving: Appears intact Safety/Judgment: Appears intact Sensation Sensation Light Touch: Appears Intact Hot/Cold: Appears Intact Proprioception: Appears Intact Coordination Gross Motor Movements are Fluid and Coordinated: Yes Fine Motor Movements are Fluid and Coordinated: Yes Coordination and Movement Description: grossly coordinated and improved strength Finger Nose Finger Test: [facility] Heel Shin Test: [facility] Motor  Motor Motor: Within Functional Limits Motor - Skilled  Clinical Observations: grossly coordinated and improved strength  Mobility Bed Mobility Bed Mobility: Rolling Right;Rolling Left;Supine to Sit;Sit to Supine Rolling Right: Independent Rolling Left: Independent Supine to Sit: Independent with assistive device Sit to Supine: Independent with assistive device Transfers Transfers: Sit to Stand;Stand to Sit Sit to Stand: Independent with assistive device Stand to Sit: Independent with assistive device Stand Pivot Transfers: Independent with assistive device Transfer (Assistive device): None Locomotion  Gait Ambulation: Yes Gait Assistance: Independent with assistive device Gait Distance (Feet): 907 Feet Assistive device: None Gait Gait: Yes Gait Pattern: Impaired Gait Pattern: Antalgic Gait velocity: Decreased Stairs / Additional Locomotion Stairs: Yes Stairs Assistance: Independent with assistive device Stair Management Technique: One rail Right Number of Stairs: 12 Height of Stairs: 6 Ramp: Independent with assistive device Curb: Independent with assistive device Pick up small object from the floor assist level: Independent with assistive device Pick up small object from the floor assistive device: no AD Wheelchair Mobility Wheelchair Mobility: No  Trunk/Postural Assessment  Cervical Assessment Cervical Assessment: Within Functional Limits Thoracic Assessment Thoracic Assessment: Within Functional Limits Lumbar Assessment Lumbar Assessment: Within Functional Limits Postural Control Postural Control: Within Functional Limits  Balance Balance Balance Assessed: Yes Standardized Balance Assessment Standardized Balance Assessment: Functional Gait Assessment Berg Balance Test Sit to Stand: Able to stand without using hands and stabilize independently Standing Unsupported: Able to stand safely 2 minutes Sitting with Back Unsupported but Feet Supported on Floor or Stool: Able to sit safely and securely 2 minutes Stand to  Sit: Sits safely with minimal use of hands Transfers: Able to transfer safely, minor use of hands Standing Unsupported with Eyes Closed: Able to stand 10 seconds safely Standing Ubsupported with Feet Together: Able to place feet together independently and stand 1 minute safely From Standing, Reach Forward with Outstretched Arm: Can reach confidently >25 cm (10") From Standing Position, Pick up Object from Floor: Able to pick up  shoe safely and easily From Standing Position, Turn to Look Behind Over each Shoulder: Looks behind from both sides and weight shifts well Turn 360 Degrees: Able to turn 360 degrees safely but slowly Standing Unsupported, Alternately Place Feet on Step/Stool: Needs assistance to keep from falling or unable to try Standing Unsupported, One Foot in Front: Loses balance while stepping or standing Standing on One Leg: Unable to try or needs assist to prevent fall Total Score: 42 Timed Up and Go Test TUG: Normal TUG;Cognitive TUG Normal TUG (seconds): 10 Cognitive TUG (seconds): 9.75 Static Sitting Balance Static Sitting - Balance Support: Feet supported Static Sitting - Level of Assistance: 7: Independent Dynamic Sitting Balance Dynamic Sitting - Level of Assistance: 7: Independent Dynamic Sitting - Balance Activities: Reaching for objects;Forward lean/weight shifting;Lateral lean/weight shifting Static Standing Balance Static Standing - Balance Support: During functional activity Static Standing - Level of Assistance: 6: Modified independent (Device/Increase time) Dynamic Standing Balance Dynamic Standing - Balance Support: During functional activity Dynamic Standing - Level of Assistance: 6: Modified independent (Device/Increase time) Dynamic Standing - Balance Activities: Lateral lean/weight shifting;Reaching for objects;Reaching across midline Functional Gait  Assessment Gait assessed : Yes Gait Level Surface: Walks 20 ft in less than 7 sec but greater than 5.5  sec, uses assistive device, slower speed, mild gait deviations, or deviates 6-10 in outside of the 12 in walkway width. Change in Gait Speed: Able to smoothly change walking speed without loss of balance or gait deviation. Deviate no more than 6 in outside of the 12 in walkway width. Gait with Horizontal Head Turns: Performs head turns smoothly with slight change in gait velocity (eg, minor disruption to smooth gait path), deviates 6-10 in outside 12 in walkway width, or uses an assistive device. Gait with Vertical Head Turns: Performs head turns with no change in gait. Deviates no more than 6 in outside 12 in walkway width. Gait and Pivot Turn: Pivot turns safely in greater than 3 sec and stops with no loss of balance, or pivot turns safely within 3 sec and stops with mild imbalance, requires small steps to catch balance. Step Over Obstacle: Is able to step over one shoe box (4.5 in total height) but must slow down and adjust steps to clear box safely. May require verbal cueing. Gait with Narrow Base of Support: Ambulates less than 4 steps heel to toe or cannot perform without assistance. Gait with Eyes Closed: Walks 20 ft, uses assistive device, slower speed, mild gait deviations, deviates 6-10 in outside 12 in walkway width. Ambulates 20 ft in less than 9 sec but greater than 7 sec. Ambulating Backwards: Walks 20 ft, uses assistive device, slower speed, mild gait deviations, deviates 6-10 in outside 12 in walkway width. Steps: Two feet to a stair, must use rail. Total Score: 18 Extremity Assessment  RUE Assessment RUE Assessment: Within Functional Limits LUE Assessment LUE Assessment: Within Functional Limits RLE Assessment RLE Assessment: Within Functional Limits LLE Assessment LLE Assessment: Within Functional Limits   Verl Dicker Verl Dicker PT, DPT  11/14/2022, 4:06 PM

## 2022-11-14 NOTE — Progress Notes (Signed)
[name], female   DOB: 1933/02/19, 89 y.o.   MRN: [id number]  Met with pt to give her team conference update regarding mod/I level and discharge date tomorrow. She is ambulating 900 ft independently and will not need home health due to moving so well. OPPT asked pt if feels would be helpful. Pt and PT feel she can work on exercises at home and continue her progress. Have ordered a 3 in1 which is covered and her daughter will get the tub bench on own. No other equipment is needed, no rolling walker and rollator needed. Pt feels ready to go home tomorrow.

## 2022-11-15 DIAGNOSIS — S72001D Fracture of unspecified part of neck of right femur, subsequent encounter for closed fracture with routine healing: Secondary | ICD-10-CM | POA: Diagnosis not present

## 2022-11-15 LAB — COMPREHENSIVE METABOLIC PANEL
ALT: 18 U/L (ref 0–44)
AST: 21 U/L (ref 15–41)
Albumin: 2.9 g/dL — ABNORMAL LOW (ref 3.5–5.0)
Alkaline Phosphatase: 55 U/L (ref 38–126)
Anion gap: 6 (ref 5–15)
BUN: 26 mg/dL — ABNORMAL HIGH (ref 8–23)
CO2: 27 mmol/L (ref 22–32)
Calcium: 8.7 mg/dL — ABNORMAL LOW (ref 8.9–10.3)
Chloride: 101 mmol/L (ref 98–111)
Creatinine, Ser: 0.91 mg/dL (ref 0.44–1.00)
GFR, Estimated: >60 mL/min (ref >60)
Glucose, Bld: 93 mg/dL (ref 70–99)
Potassium: 4.3 mmol/L (ref 3.5–5.1)
Sodium: 134 mmol/L — ABNORMAL LOW (ref 135–145)
Total Bilirubin: 0.6 mg/dL (ref 0.3–1.2)
Total Protein: 6.1 g/dL — ABNORMAL LOW (ref 6.5–8.1)

## 2022-11-15 LAB — CBC WITH DIFFERENTIAL/PLATELET
Abs Immature Granulocytes: 0.12 10*3/uL — ABNORMAL HIGH (ref 0.00–0.07)
Basophils Absolute: 0.1 10*3/uL (ref 0.0–0.1)
Basophils Relative: 1 %
Eosinophils Absolute: 0.3 10*3/uL (ref 0.0–0.5)
Eosinophils Relative: 3 %
HCT: 24.6 % — ABNORMAL LOW (ref 36.0–46.0)
Hemoglobin: 8 g/dL — ABNORMAL LOW (ref 12.0–15.0)
Immature Granulocytes: 1 %
Lymphocytes Relative: 28 %
Lymphs Abs: 2.4 10*3/uL (ref 0.7–4.0)
MCH: 31.3 pg (ref 26.0–34.0)
MCHC: 32.5 g/dL (ref 30.0–36.0)
MCV: 96.1 fL (ref 80.0–100.0)
Monocytes Absolute: 0.4 10*3/uL (ref 0.1–1.0)
Monocytes Relative: 5 %
Neutro Abs: 5.3 10*3/uL (ref 1.7–7.7)
Neutrophils Relative %: 62 %
Platelets: 318 10*3/uL (ref 150–400)
RBC: 2.56 MIL/uL — ABNORMAL LOW (ref 3.87–5.11)
RDW: 13.2 % (ref 11.5–15.5)
WBC: 8.5 10*3/uL (ref 4.0–10.5)
nRBC: 0 % (ref 0.0–0.2)

## 2022-11-15 NOTE — Progress Notes (Signed)
Completed and closed out nursing education and nursing care plan for discharge.

## 2022-11-15 NOTE — Progress Notes (Signed)
PROGRESS NOTE   Subjective/Complaints:  Did better with Tramadol- willing to go home with it.   Ready for d/c today Had low BP this Am, resolved with voiding.   Still chronic constipation issues- but should get better with tramadol.   ROS:  Pt denies SOB, abd pain, CP, N/V/C/D, and vision changes   Except for HPI   Objective:   No results found. Recent Labs    11/15/22 0542  WBC 8.5  HGB 8.0*  HCT 24.6*  PLT 318   Recent Labs    11/15/22 0542  NA 134*  K 4.3  CL 101  CO2 27  GLUCOSE 93  BUN 26*  CREATININE 0.91  CALCIUM 8.7*    Intake/Output Summary (Last 24 hours) at 11/15/2022 0746 Last data filed at 11/14/2022 1843 Gross per 24 hour  Intake 876 ml  Output --  Net 876 ml        Physical Exam: Vital Signs Blood pressure (!) 106/43, pulse 75, temperature 98.2 F (36.8 C), resp. rate 15, height 5' 2"$  (1.575 m), weight 60.2 kg, SpO2 95 %.       General: awake, alert, appropriate, on toilet; transferred/walked to bed with RW;  no difficulties; NAD HENT: conjugate gaze; oropharynx moist CV: regular rate; no JVD Pulmonary: CTA B/L; no W/R/R- good air movement GI: soft, NT, ND, (+)BS Psychiatric: appropriate Neurological: Ox3  Musculoskeletal:     Cervical back: Neck supple. No tenderness.     Comments: UE strength 5-/5 in biceps, triceps, WE, grip and FA B/L RLE- HF 4/5; KE/KF 4/5 and DF 3-/5 and PF 4+/5 LLE- 5-/5 in same muscles Mild swelling of R hip/upper thigh and not wearing R ASO   Skin:    General: Skin is warm and dry.     Comments: Hip incision clean and dry with original surgical dressing in place. No change- less swelling of R hip  Appropriately tender R ankle and anterior tibialis are bruised Raw spot on back of R ankle  Neurological:     Mental Status: She is alert and oriented to person, place, and time. Hard of hearing    Comments: Patient is alert and oriented x 3  and follows commands.  Intact to light touch in all 4 extremities  Assessment/Plan: 1. Functional deficits which require 3+ hours per day of interdisciplinary therapy in a comprehensive inpatient rehab setting. Physiatrist is providing close team supervision and 24 hour management of active medical problems listed below. Physiatrist and rehab team continue to assess barriers to discharge/monitor patient progress toward functional and medical goals  Care Tool:  Bathing    Body parts bathed by patient: Right arm, Left arm, Chest, Abdomen, Front perineal area, Right upper leg, Left upper leg, Face, Buttocks, Right lower leg, Left lower leg   Body parts bathed by helper: Buttocks, Left lower leg, Right lower leg     Bathing assist Assist Level: Independent with assistive device     Upper Body Dressing/Undressing Upper body dressing   What is the patient wearing?: Bra, Pull over shirt    Upper body assist Assist Level: Independent    Lower Body Dressing/Undressing Lower body dressing  What is the patient wearing?: Underwear/pull up, Pants     Lower body assist Assist for lower body dressing: Independent with assitive device     Toileting Toileting    Toileting assist Assist for toileting: Independent with assistive device     Transfers Chair/bed transfer  Transfers assist     Chair/bed transfer assist level: Independent with assistive device     Locomotion Ambulation   Ambulation assist      Assist level: Independent with assistive device Assistive device: No Device Max distance: 900 ft   Walk 10 feet activity   Assist     Assist level: Independent with assistive device Assistive device: No Device   Walk 50 feet activity   Assist    Assist level: Independent with assistive device Assistive device: No Device    Walk 150 feet activity   Assist    Assist level: Independent with assistive device Assistive device: No Device    Walk  10 feet on uneven surface  activity   Assist     Assist level: Independent with assistive device Assistive device: Other (comment) (no AD)   Wheelchair     Assist Is the patient using a wheelchair?: No Type of Wheelchair: Manual           Wheelchair 50 feet with 2 turns activity    Assist        Assist Level: Dependent - Patient 0% (Per Eval)   Wheelchair 150 feet activity     Assist      Assist Level: Dependent - Patient 0% (Per Eval)   Blood pressure (!) 106/43, pulse 75, temperature 98.2 F (36.8 C), resp. rate 15, height 5' 2"$  (1.575 m), weight 60.2 kg, SpO2 95 %.  Medical Problem List and Plan: 1. Functional deficits secondary to right femoral neck fracture after mechanical fall.  Status post right total hip arthroplasty anterior approach 11/05/2022.  Weightbearing as tolerated             -patient may  shower-cover incision             -ELOS/Goals: 10-14 days- supervision to CGA  Con't CIR PT and OT  D/c today  Will f/u with surgeon 2.  Antithrombotics: -DVT/anticoagulation:  Mechanical: Antiembolism stockings, thigh (TED hose) Bilateral lower extremities.  Check vascular study             -antiplatelet therapy: Aspirin 325 mg twice daily x 18 days then begin aspirin 81 mg daily for 3 weeks then discontinue 3. Pain Management: Hydrocodone/tramadol as needed.  Robaxin as needed for muscle spasms- recommended to take tylenol or tramadol before therapy sessions.   2/9- asked pt to take tylenol before therapy- can ask for tramadol if tylenol "not enough". Let nursing know  2/11 continue prn tylenol, hydrocodone, tramadol prn for pain  2/12- mainly taking tylenol or Norco- but occ Norco  2/13- wil ask nursing to try Tramadol for pain- if doesn't work (and informed pt) then go back to [facility]- seeing if can wean some.   2/14- doing better with tramadol- per pt- will send home on Tramadol prn 7 days of meds 4. Mood/Behavior/Sleep: Xanax 0.25 mg twice daily  as needed .Provide emotional support             -antipsychotic agents: N/A 5. Neuropsych/cognition: This patient is capable of making decisions on her own behalf. 6. Skin/Wound Care: Routine skin checks 7. Fluids/Electrolytes/Nutrition: Routine in and outs with follow-up chemistries  2/9- labs look good 8.  Right ankle avulsion fracture.  ASO ankle brace applied.  Weightbearing as tolerated  -2/11 Per prior ortho note ASO when needed when ambulating, will adjust orders  2/12- can use prn- per orders- will wait to call Ortho  2/14- walking well without ASO 9.  Acute blood loss anemia.  Follow-up CBC  2/9- Hb 8.3- stable- con't to monitor 10.  Hypertension.  Low-dose lisinopril 5 mg daily 2/11 decrease lisinopril to 2.5 mg  2/13- BP controlled- con't regimen  2/14- BP a little low this AM, but came up after voided this AM    11/15/2022    6:43 AM 11/14/2022    9:40 PM 11/14/2022    1:45 PM  Vitals with BMI  Systolic [id number]  Diastolic 43 55 40  Pulse 75 72 72    11.  Hypothyroidism.  Synthroid 12. Insomnia- will add Trazodone 25-50 mg QHS for sleep prn  2/9- slept somewhat better- con't regimen  2/11 will increase trazodone to 54m 13. Constipation- rare Bms- if no BM by tomorrow, will give Sorbitol and soap suds enema if necessary to clean her out.  - also con't benefiber, 2-3x/day- her home dose.   2/9- will give Sorbitol after therapy today 30 cc to get her cleaned out. If need be, add soap suds enema.   2/11 LBM today, improved  2/12- LBM x2 yesterday  2/13- LBM yesterday early AM--still having constipation issues- showed metamucil "refused' but pt noted she didn't receive it.   14. Urinary urgency- hopefully trazodone and Anticholinergic effects help her with urinary urgency; if not, will need to check U/A and Cx and maybe if that's negative, use Myrbetriq?  2/9- doing slightly better- wait on U/A etc for now    I spent a total of  33  minutes on total care today- >50%  coordination of care- due to  D/w PA about pain meds- and d/c plans.   LOS: 6 days A FACE TO FACE EVALUATION WAS PERFORMED  Wadie Liew 11/15/2022, 7:46 AM

## 2022-11-15 NOTE — Progress Notes (Signed)
Inpatient Rehabilitation Discharge Medication Review by a Pharmacist  A complete drug regimen review was completed for this patient to identify any potential clinically significant medication issues.  High Risk Drug Classes Is patient taking? Indication by Medication  Antipsychotic No   Anticoagulant No   Antibiotic No   Opioid Yes Tramadol-pain  Antiplatelet Yes Aspirin--VTE prophylaxis x 2 days, then 71m x 3 weeks and stop.  Hypoglycemics/insulin No   Vasoactive Medication Yes Lisinopril-BP  Chemotherapy No   Other Yes Trazadone--sleep Docusate, Miralax, PsylLium-constipation Methocarbamol-Muscle spasms Levothyroxine-thyroid supplement Xanax-anxiety Multivitamin- supplement      Type of Medication Issue Identified Description of Issue Recommendation(s)  Drug Interaction(s) (clinically significant)     Duplicate Therapy     Allergy     No Medication Administration End Date  Aspirin should be 3227mx 2 days then, decrease to 8153maily x 21 days Special Instructions as 11/15/22: Continue aspirin 325 mg twice daily x 2 more days then begin aspirin 81 mg daily x 3 weeks and stop.  Incorrect Dose     Additional Drug Therapy Needed     Significant med changes from prior encounter (inform family/care partners about these prior to discharge).    Other       Clinically significant medication issues were identified that warrant physician communication and completion of prescribed/recommended actions by midnight of the next day:  Yes  Name of provider notified for urgent issues identified: DanSylvester HarderA  Provider Method of Notification: secure message  Pharmacist comments:   Time spent performing this drug regimen review (minutes):  20 [place] Pharmac[place] 11/15/2022   9:33 AM

## 2022-11-18 ENCOUNTER — Telehealth (INDEPENDENT_AMBULATORY_CARE_PROVIDER_SITE_OTHER): Payer: Medicare Other | Admitting: Internal Medicine

## 2022-11-18 DIAGNOSIS — R21 Rash and other nonspecific skin eruption: Secondary | ICD-10-CM | POA: Diagnosis not present

## 2022-11-18 MED ORDER — PREDNISONE 10 MG PO TABS: ORAL_TABLET | ORAL | 0 refills | Status: DC

## 2022-11-18 NOTE — Telephone Encounter (Signed)
I connected with Dominique Livingston, daughter of Dominique Livingston. Dominique Livingston who is a patient in this practice this afternoon at 3:50 PM regarding a rash Dominique Livingston has had since discharge from the hospital for hip fracture with repair by Dr. Maureen Ralphs.  She is identified using 2 identifiers as Dominique Livingston, a longstanding patient in this practice.  Patient is at her home and I am at home.  She is agreeable to visit in this format today.  Patient has been in rehab recovering from hip fracture between February 8 and February 14.  But subsequently the day after  discharge began developing an erythematous rash that was itchy and irritated that has not been responding to over-the-counter antihistamine therapy.  And February 14.  Daughter reports she had no rash at the time of discharge.  Patient was admitted February 2 3 February 8 to the orthopedic service and underwent total hip arthroplasty anterior approach on February 4.  We have reviewed her current medications on the phone today with her daughter.  She is not having to take narcotic pain medication at this point.  She has a history of hypothyroidism and is on thyroid replacement medication.  She does have a remote history of urticaria.  She has hypertension and hyperlipidemia as well as hypothyroidism.  The only new medication is Robaxin.  She really has not been taking it.  She had urticaria and hives in 2021.  She generally responds to a short course of prednisone.  Unable to visualize rash today due to technical difficulties. No respiratory distress hust red itchy rash that is mainly macular according to her daughter.    Imp: Urticaria          Right hip fracture Plan: send in short taper of Prednisone 10 mg Tablets starting with  $ x 10 mg tabs today an appointment withd decreasing by one tab every 2 days 4-4-3-3-2-2-1-1 taper. Daughter to call us with progress report on Monday. Time spent with chart review,  video interview, and medical decision making  plus e-scribing meds is 20 minutes.

## 2022-12-07 ENCOUNTER — Other Ambulatory Visit: Payer: Self-pay | Admitting: Internal Medicine

## 2022-12-19 DIAGNOSIS — Z5189 Encounter for other specified aftercare: Secondary | ICD-10-CM | POA: Diagnosis not present

## 2022-12-19 DIAGNOSIS — M25571 Pain in right ankle and joints of right foot: Secondary | ICD-10-CM | POA: Diagnosis not present

## 2023-01-23 DIAGNOSIS — M25571 Pain in right ankle and joints of right foot: Secondary | ICD-10-CM | POA: Diagnosis not present

## 2023-03-05 ENCOUNTER — Other Ambulatory Visit: Payer: Self-pay | Admitting: Internal Medicine

## 2023-05-15 ENCOUNTER — Ambulatory Visit: Payer: Self-pay

## 2023-05-15 NOTE — Patient Instructions (Signed)
Visit Information  Thank you for taking time to visit with me today. Please don't hesitate to contact me if I can be of assistance to you.   Following are the goals we discussed today:   Goals Addressed             This Visit's Progress    COMPLETED: Nurse Care Coordination Activities: further follow up needed       Care Coordination Interventions: Reviewed patient's chart in preparation to contact patient Noted patient is currently inpatient at [facility], 2/2-2/8; dx: hip fracture  Noted patient was transferred to [facility] and is currently still inpatient      To avoid from having further falls       Care Coordination Interventions: Provided written and verbal education re: potential causes of falls and Fall prevention strategies Advised patient of importance of notifying provider of falls Assessed working status of life alert bracelet and patient adherence Assessed social determinant of health barriers SW referral sent to Bevelyn Ngo BSW to assist with emergency alert system         Our next appointment is by telephone on 06/27/23 at 11:00 AM   Please call the care guide team at [phone] if you need to cancel or reschedule your appointment.   If you are experiencing a Mental Health or Behavioral Health Crisis or need someone to talk to, please call 1-800-273-TALK (toll free, 24 hour hotline)  Patient verbalizes understanding of instructions and care plan provided today and agrees to view in MyChart. Active MyChart status and patient understanding of how to access instructions and care plan via MyChart confirmed with patient.     Delsa Sale, RN, BSN, CCM Care Management Coordinator [facility] Care Management  Direct Phone: [phone]

## 2023-05-15 NOTE — Patient Outreach (Signed)
  Care Coordination   Follow Up Visit Note   05/15/2023 Name: Dominique Livingston MRN: [id number] DOB: 07/10/1933  PARTHENIA GLADSON is a 89 y.o. year old female who sees Baxley, Luanna Cole, MD for primary care. I spoke with  [name] by phone today.  What matters to the patients health and wellness today?  Patient would like to consider getting an emergency alert system. She would like to avoid having further falls.     Goals Addressed             This Visit's Progress    COMPLETED: Nurse Care Coordination Activities: further follow up needed       Care Coordination Interventions: Reviewed patient's chart in preparation to contact patient Noted patient is currently inpatient at [facility], 2/2-2/8; dx: hip fracture  Noted patient was transferred to [facility] and is currently still inpatient      To avoid from having further falls       Care Coordination Interventions: Provided written and verbal education re: potential causes of falls and Fall prevention strategies Advised patient of importance of notifying provider of falls Assessed working status of life alert bracelet and patient adherence Assessed social determinant of health barriers SW referral sent to Bevelyn Ngo BSW to assist with emergency alert system     Interventions Today    Flowsheet Row Most Recent Value  Chronic Disease   Chronic disease during today's visit Other  [s/p hip fracture]  General Interventions   General Interventions Discussed/Reviewed General Interventions Discussed, General Interventions Reviewed, Doctor Visits, Communication with  Doctor Visits Discussed/Reviewed Doctor Visits Discussed, Doctor Visits Reviewed, Specialist, PCP  Communication with Social Work  Fonnie Birkenhead: emergency alert system]  Exercise Interventions   Exercise Discussed/Reviewed Physical Activity  Physical Activity Discussed/Reviewed Physical Activity Reviewed, Physical Activity Discussed  Education  Interventions   Education Provided Provided Education  Provided Verbal Education On Exercise, When to see the doctor  Safety Interventions   Safety Discussed/Reviewed Safety Discussed, Safety Reviewed, Fall Risk, Home Safety  Home Safety Assistive Devices, Refer for community resources          SDOH assessments and interventions completed:  No     Care Coordination Interventions:  Yes, provided   Follow up plan: Follow up call scheduled for 06/27/23 @11 :00 AM    Encounter Outcome:  Pt. Visit Completed

## 2023-05-22 ENCOUNTER — Ambulatory Visit: Payer: Self-pay

## 2023-05-22 NOTE — Patient Instructions (Signed)
Visit Information  Thank you for taking time to visit with me today. Please don't hesitate to contact me if I can be of assistance to you.   Following are the goals we discussed today:  - Review mailed resource materials  If you are experiencing a Mental Health or Behavioral Health Crisis or need someone to talk to, please call 1-800-273-TALK (toll free, 24 hour hotline) go to [facility] [address], [place] [phone]) call 911  Patient verbalizes understanding of instructions and care plan provided today and agrees to view in MyChart. Active MyChart status and patient understanding of how to access instructions and care plan via MyChart confirmed with patient.     No further follow up required: Please contact me as needed  Bevelyn Ngo, BSW, CDP Social Worker, Certified Dementia Practitioner [facility] Care Management  Care Coordination [phone]

## 2023-05-22 NOTE — Patient Outreach (Signed)
  Care Coordination   Follow Up Visit Note   05/22/2023 Name: Dominique Livingston MRN: [id number] DOB: September 19, 1933  Dominique Livingston is a 89 y.o. year old female who sees Baxley, Luanna Cole, MD for primary care. I spoke with  Dominique Livingston by phone today.  What matters to the patients health and wellness today?  Obtain a better understanding of life alert options    Goals Addressed             This Visit's Progress    COMPLETED: Care Coordination Activities       Care Coordination Interventions: Discussed the patient may be interested in obtaining a medical alert device due to history of falls Determined the patient currently tries to keep a phone with her at all times so she may call for help if needed Education provided on the [facility] Med Alert Program - patient is not sure she wants to proceed with obtaining a device at this time Provided the patient with educational materials via mail for her to review with her children Encouraged the patient to contact SW as needed         SDOH assessments and interventions completed:  No     Care Coordination Interventions:  Yes, provided   Interventions Today    Flowsheet Row Most Recent Value  Chronic Disease   Chronic disease during today's visit Other  [Fall Risk]  General Interventions   General Interventions Discussed/Reviewed General Interventions Discussed  Education Interventions   Education Provided Provided Education  Provided Verbal Education On [employer]  [facility] Med Alert Program]  Safety Interventions   Safety Discussed/Reviewed Safety Discussed        Follow up plan: No further intervention required.   Encounter Outcome:  Pt. Visit Completed   Bevelyn Ngo, BSW, CDP Social Worker, Certified Dementia Practitioner [facility] Care Management  Care Coordination [phone]

## 2023-06-05 ENCOUNTER — Other Ambulatory Visit: Payer: Self-pay | Admitting: Internal Medicine

## 2023-06-18 NOTE — Progress Notes (Signed)
Annual Wellness Visit    Patient Care Team: Elissa Grieshop, Luanna Cole, MD as PCP - General (Internal Medicine) Riley Churches, RN as [facility] Care Management  Visit Date: 06/25/23   Chief Complaint  Patient presents with   Medicare Wellness   Annual Exam    Subjective:   Patient: Dominique Livingston, Female    DOB: 1933-05-06, 89 y.o.   MRN: [id number]  Dominique Livingston is a 89 y.o. Female who presents today for her Annual Wellness Visit. History of allergies, anxiety, arthritis, diverticulosis, endometriosis, fibrocystic breast disease, GERD, hyperlipidemia, hypertension, hypothyroidism, IBS.  She is feeling well regarding overall health.   She has had difficulty sleeping for the past week. Has some situational stress relating to things breaking in her home.  She is doing well after having right total hip arthroplasty on 11/05/22. Sustained right hip fracture and right ankle avulsion fracture after a fall in 2/24. She is not having pain in this hip or ankle.   She continues to drive.  History of anxiety and insomnia treated with alprazolam 0.25 mg twice daily as needed, trazodone 75 mg as needed. Requesting refill of alprazolam.  History of hypertension treated with lisinopril 2.5 mg daily.   History of hypothyroidism treated with levothyroxine 75 mcg daily.   She was admitted to the hospital in early July 2023 with abdominal pain, nausea and vomiting.  She has history of partial colectomy with enterocolonic colitis.  History of chronic constipation.  She was diagnosed with partial small bowel obstruction and irritable bowel syndrome.  CT showed diffuse dilatation and air-fluid levels throughout the stomach, small bowel and colon likely due to an ileus.  There was concern for acute segmental colitis with possible inflammatory stricture.  There was edema/venous congestion within the lower abdominal small bowel mesentery.  It was treated conservatively with IV fluids along  with metronidazole, Rocephin and was discharged home with follow-up with [facility].  We saw her in follow-up on July 21 after her discharge on July 14.  She was complaining of black stools and frequent stools that were loose.  We contacted [facility] who suggested several days of Cipro and metronidazole.  Stool for C. difficile was negative.   She has a history of allergic rhinitis, history of urticaria in 2021 thought to be due to anxiety after her husband passed away in the Fall 2020.   Surgery for endometriosis involving left salpingo-oophorectomy.  During that time there was obstruction of the sigmoid colon which resulted in left colectomy by Dr. Rolene Course in 1969.  She subsequently had hysterectomy and right salpingo-oophorectomy by Dr. Delbert Phenix.  Remote history of polymyalgia rheumatica in 2001 resolved after treatment and has never recurred.   Osteopenia not on bone sparing medication.  Labs are pending.  Colonoscopy last completed January 18, 2010 with removal of 3 small sessile polyps in sigmoid colon and sigmoid diverticulosis.  Social history: She is a widow who resides alone.  Husband was a retired Company secretary.  She has 2 adult children.   Family history: Father died at age 80 with pneumonia/COPD/heart problems.  Mother died at age 61 of cancer that was metastatic from unknown primary.  2 brothers with hypertension.  Daughter with history of hypertension and allergies.  Past Medical History:  Diagnosis Date   Allergy    Anxiety    Arthritis    hands , stiffness in knees & hips    Colon polyp, hyperplastic    Diverticulosis  Endometriosis    Fibrocystic breast disease    GERD (gastroesophageal reflux disease)    Hyperlipidemia    Hypertension    Hypothyroidism    IBS (irritable bowel syndrome)    Polymyalgia rheumatica (HCC)      Family History  Problem Relation Age of Onset   Cancer Mother        type unknown   Hypertension Mother    Heart disease Father    COPD  Father    Hypertension Daughter    Heart disease Son    Heart attack Son    Breast cancer Neg Hx      Social History   Social History Narrative   Not on file     Review of Systems  Constitutional:  Negative for chills, fever, malaise/fatigue and weight loss.  HENT:  Negative for hearing loss, sinus pain and sore throat.   Respiratory:  Negative for cough, hemoptysis and shortness of breath.   Cardiovascular:  Negative for chest pain, palpitations, leg swelling and PND.  Gastrointestinal:  Negative for abdominal pain, constipation, diarrhea, heartburn, nausea and vomiting.  Genitourinary:  Negative for dysuria, frequency and urgency.  Musculoskeletal:  Negative for back pain, myalgias and neck pain.  Skin:  Negative for itching and rash.  Neurological:  Negative for dizziness, tingling, seizures and headaches.  Endo/Heme/Allergies:  Negative for polydipsia.  Psychiatric/Behavioral:  Negative for depression. The patient is not nervous/anxious.       Objective:   Vitals: BP 130/70   Pulse 71   Ht 5\' 1"  (1.549 m)   Wt 127 lb (57.6 kg)   SpO2 96%   BMI 24.00 kg/m   Physical Exam Vitals and nursing note reviewed.  Constitutional:      General: She is not in acute distress.    Appearance: Normal appearance. She is not ill-appearing or toxic-appearing.  HENT:     Head: Normocephalic and atraumatic.     Right Ear: Hearing, tympanic membrane, ear canal and external ear normal.     Left Ear: Hearing, tympanic membrane, ear canal and external ear normal.     Mouth/Throat:     Pharynx: Oropharynx is clear.  Eyes:     Extraocular Movements: Extraocular movements intact.     Pupils: Pupils are equal, round, and reactive to light.  Neck:     Thyroid: No thyroid mass, thyromegaly or thyroid tenderness.     Vascular: No carotid bruit.  Cardiovascular:     Rate and Rhythm: Normal rate and regular rhythm. No extrasystoles are present.    Pulses:          Dorsalis pedis pulses  are 1+ on the right side and 1+ on the left side.     Heart sounds: Normal heart sounds. No murmur heard.    No friction rub. No gallop.  Pulmonary:     Effort: Pulmonary effort is normal.     Breath sounds: Normal breath sounds. No decreased breath sounds, wheezing, rhonchi or rales.  Chest:     Chest wall: No mass.  Abdominal:     Palpations: Abdomen is soft. There is no hepatomegaly, splenomegaly or mass.     Tenderness: There is no abdominal tenderness.     Hernia: No hernia is present.  Musculoskeletal:     Cervical back: Normal range of motion.     Right lower leg: No edema.     Left lower leg: No edema.  Lymphadenopathy:     Cervical: No cervical adenopathy.  Upper Body:     Right upper body: No supraclavicular adenopathy.     Left upper body: No supraclavicular adenopathy.  Skin:    General: Skin is warm and dry.  Neurological:     General: No focal deficit present.     Mental Status: She is alert and oriented to person, place, and time. Mental status is at baseline.     Sensory: Sensation is intact.     Motor: Motor function is intact. No weakness.     Deep Tendon Reflexes: Reflexes are normal and symmetric.  Psychiatric:        Attention and Perception: Attention normal.        Mood and Affect: Mood normal.        Speech: Speech normal.        Behavior: Behavior normal.        Thought Content: Thought content normal.        Cognition and Memory: Cognition normal.        Judgment: Judgment normal.      Most recent functional status assessment:    06/25/2023    9:44 AM  In your present state of health, do you have any difficulty performing the following activities:  Hearing? 1  Vision? 0  Difficulty concentrating or making decisions? 0  Walking or climbing stairs? 0  Dressing or bathing? 0  Doing errands, shopping? 0  Preparing Food and eating ? N  Using the Toilet? N  In the past six months, have you accidently leaked urine? N  Do you have problems  with loss of bowel control? N  Managing your Medications? N  Managing your Finances? N  Housekeeping or managing your Housekeeping? N   Most recent fall risk assessment:    06/25/2023    9:43 AM  Fall Risk   Falls in the past year? 1  Number falls in past yr: 0  Injury with Fall? 0  Risk for fall due to : Impaired mobility  Follow up Falls prevention discussed    Most recent depression screenings:    06/25/2023   10:04 AM 06/22/2022   10:35 AM  PHQ 2/9 Scores  PHQ - 2 Score 0 0   Most recent cognitive screening:    06/25/2023    9:49 AM  6CIT Screen  What Year? 0 points  What month? 0 points  What time? 0 points  Count back from 20 0 points  Months in reverse 0 points  Repeat phrase 0 points  Total Score 0 points     Results:   Studies obtained and personally reviewed by me:  Colonoscopy last completed January 18, 2010 with removal of 3 small sessile polyps in sigmoid colon and sigmoid diverticulosis.  Labs:       Component Value Date/Time   NA 134 (L) 11/15/2022 0542   K 4.3 11/15/2022 0542   CL 101 11/15/2022 0542   CO2 27 11/15/2022 0542   GLUCOSE 93 11/15/2022 0542   BUN 26 (H) 11/15/2022 0542   CREATININE 0.91 11/15/2022 0542   CREATININE 0.79 06/22/2022 1026   CALCIUM 8.7 (L) 11/15/2022 0542   PROT 6.1 (L) 11/15/2022 0542   ALBUMIN 2.9 (L) 11/15/2022 0542   AST 21 11/15/2022 0542   ALT 18 11/15/2022 0542   ALKPHOS 55 11/15/2022 0542   BILITOT 0.6 11/15/2022 0542   GFRNONAA >60 11/15/2022 0542   GFRNONAA 62 05/28/2020 1202   GFRAA 72 05/28/2020 1202     Lab Results  Component  Value Date   WBC 8.5 11/15/2022   HGB 8.0 (L) 11/15/2022   HCT 24.6 (L) 11/15/2022   MCV 96.1 11/15/2022   PLT 318 11/15/2022    Lab Results  Component Value Date   CHOL 264 (H) 06/22/2022   HDL 59 06/22/2022   LDLCALC 169 (H) 06/22/2022   TRIG 198 (H) 06/22/2022   CHOLHDL 4.5 06/22/2022    Lab Results  Component Value Date   HGBA1C 5.3 11/02/2016     Lab  Results  Component Value Date   TSH 0.80 06/22/2022    Assessment & Plan:   Anxiety and insomnia: treated with alprazolam 0.25 mg twice daily as needed, trazodone 75 mg as needed. Refilled alprazolam.  Hypertension: treated with lisinopril 2.5 mg daily. Does not tolerate statins. Blood pressure normal at 130/70 today.  Hypothyroidism: treated with levothyroxine 75 mcg daily.  Osteopenia not on bone sparing medication.  She is doing well after having right total hip arthroplasty on 11/05/22. Sustained right hip fracture and right ankle avulsion fracture after a fall in 2/24. She is not having pain in this hip or ankle.   Labs are pending.  Colonoscopy last completed January 18, 2010 with removal of 3 small sessile polyps in sigmoid colon and sigmoid diverticulosis.  Vaccine counseling: reminded that she is due for a tetanus vaccine. She will go to the pharmacy for flu and Covid-19 boosters.  Return in 1 year or as needed.     Annual wellness visit done today including the all of the following: Reviewed patient's Family Medical History Reviewed and updated list of patient's medical providers Assessment of cognitive impairment was done Assessed patient's functional ability Established a written schedule for health screening services Health Risk Assessent Completed and Reviewed  Discussed health benefits of physical activity, and encouraged her to engage in regular exercise appropriate for her age and condition.        I,Alexander Ruley,acting as a Neurosurgeon for Margaree Mackintosh, MD.,have documented all relevant documentation on the behalf of Margaree Mackintosh, MD,as directed by  Margaree Mackintosh, MD while in the presence of Margaree Mackintosh, MD.   ***

## 2023-06-25 ENCOUNTER — Ambulatory Visit (INDEPENDENT_AMBULATORY_CARE_PROVIDER_SITE_OTHER): Payer: Medicare Other | Admitting: Internal Medicine

## 2023-06-25 ENCOUNTER — Encounter: Payer: Self-pay | Admitting: Internal Medicine

## 2023-06-25 ENCOUNTER — Other Ambulatory Visit: Payer: Medicare Other

## 2023-06-25 VITALS — BP 130/70 | HR 71 | Ht 61.0 in | Wt 127.0 lb

## 2023-06-25 DIAGNOSIS — Z0000 Encounter for general adult medical examination without abnormal findings: Secondary | ICD-10-CM

## 2023-06-25 DIAGNOSIS — I10 Essential (primary) hypertension: Secondary | ICD-10-CM

## 2023-06-25 DIAGNOSIS — Z789 Other specified health status: Secondary | ICD-10-CM | POA: Diagnosis not present

## 2023-06-25 DIAGNOSIS — F411 Generalized anxiety disorder: Secondary | ICD-10-CM

## 2023-06-25 DIAGNOSIS — E039 Hypothyroidism, unspecified: Secondary | ICD-10-CM

## 2023-06-25 DIAGNOSIS — E782 Mixed hyperlipidemia: Secondary | ICD-10-CM

## 2023-06-25 LAB — POCT URINALYSIS DIP (CLINITEK)
Bilirubin, UA: NEGATIVE
Blood, UA: NEGATIVE
Glucose, UA: NEGATIVE mg/dL
Ketones, POC UA: NEGATIVE mg/dL
Leukocytes, UA: NEGATIVE
Nitrite, UA: NEGATIVE
POC PROTEIN,UA: NEGATIVE
Spec Grav, UA: 1.01 (ref 1.010–1.025)
Urobilinogen, UA: 0.2 E.U./dL
pH, UA: 6.5 (ref 5.0–8.0)

## 2023-06-25 MED ORDER — ALPRAZOLAM 0.25 MG PO TABS: 0.2500 mg | ORAL_TABLET | Freq: Two times a day (BID) | ORAL | 5 refills | Status: AC | PRN

## 2023-06-26 LAB — CBC WITH DIFFERENTIAL/PLATELET
Absolute Monocytes: 358 cells/uL (ref 200–950)
Basophils Absolute: 58 cells/uL (ref 0–200)
Basophils Relative: 0.9 %
Eosinophils Absolute: 109 cells/uL (ref 15–500)
Eosinophils Relative: 1.7 %
HCT: 38.9 % (ref 35.0–45.0)
Hemoglobin: 12.5 g/dL (ref 11.7–15.5)
Lymphs Abs: 1632 cells/uL (ref 850–3900)
MCH: 30.1 pg (ref 27.0–33.0)
MCHC: 32.1 g/dL (ref 32.0–36.0)
MCV: 93.7 fL (ref 80.0–100.0)
MPV: 9.3 fL (ref 7.5–12.5)
Monocytes Relative: 5.6 %
Neutro Abs: 4243 cells/uL (ref 1500–7800)
Neutrophils Relative %: 66.3 %
Platelets: 226 10*3/uL (ref 140–400)
RBC: 4.15 10*6/uL (ref 3.80–5.10)
RDW: 12.1 % (ref 11.0–15.0)
Total Lymphocyte: 25.5 %
WBC: 6.4 10*3/uL (ref 3.8–10.8)

## 2023-06-26 LAB — LIPID PANEL
Cholesterol: 315 mg/dL — ABNORMAL HIGH (ref <200)
HDL: 67 mg/dL (ref >=50)
LDL Cholesterol (Calc): 211 mg/dL (calc) — ABNORMAL HIGH
Non-HDL Cholesterol (Calc): 248 mg/dL (calc) — ABNORMAL HIGH (ref <130)
Total CHOL/HDL Ratio: 4.7 (calc) (ref <5.0)
Triglycerides: 193 mg/dL — ABNORMAL HIGH (ref <150)

## 2023-06-26 LAB — COMPLETE METABOLIC PANEL WITH GFR
AG Ratio: 1.5 (calc) (ref 1.0–2.5)
ALT: 14 U/L (ref 6–29)
AST: 20 U/L (ref 10–35)
Albumin: 4.9 g/dL (ref 3.6–5.1)
Alkaline phosphatase (APISO): 64 U/L (ref 37–153)
BUN: 21 mg/dL (ref 7–25)
CO2: 30 mmol/L (ref 20–32)
Calcium: 10.2 mg/dL (ref 8.6–10.4)
Chloride: 102 mmol/L (ref 98–110)
Creat: 0.91 mg/dL (ref 0.60–0.95)
Globulin: 3.3 g/dL (calc) (ref 1.9–3.7)
Glucose, Bld: 91 mg/dL (ref 65–99)
Potassium: 4.6 mmol/L (ref 3.5–5.3)
Sodium: 143 mmol/L (ref 135–146)
Total Bilirubin: 0.6 mg/dL (ref 0.2–1.2)
Total Protein: 8.2 g/dL — ABNORMAL HIGH (ref 6.1–8.1)
eGFR: 61 mL/min/{1.73_m2} (ref >=60)

## 2023-06-26 LAB — TSH: TSH: 5.92 mIU/L — ABNORMAL HIGH (ref 0.40–4.50)

## 2023-06-26 NOTE — Patient Instructions (Addendum)
Continue current meds for anxiety and insomnia.  Vaccines discussed. Have tetanus update as well as flu and Covid boosters. I will speak with daughter about elevated TSH and lipid results.

## 2023-06-27 ENCOUNTER — Ambulatory Visit: Payer: Self-pay

## 2023-06-27 NOTE — Patient Outreach (Signed)
Care Coordination   Follow Up Visit Note   06/27/2023 Name: KALLISTA LACY MRN: [id number] DOB: 1933-05-24  KORINN HALUSKA is a 89 y.o. year old female who sees Baxley, Luanna Cole, MD for primary care. I spoke with  [name] by phone today.  What matters to the patients health and wellness today?  Patient would like to work on lowering her Cholesterol.     Goals Addressed             This Visit's Progress    COMPLETED: To avoid from having further falls       Care Coordination Interventions: Provided written and verbal education re: potential causes of falls and Fall prevention strategies Advised patient of importance of notifying provider of falls Assessed for falls since last encounter Assessed patients knowledge of fall risk prevention secondary to previously provided education      To work on lowering Cholesterol levels       Care Coordination Interventions: Provider established cholesterol goals reviewed Provided Cholesterol educational materials Reviewed role and benefits of statin for ASCVD risk reduction Reviewed importance of limiting foods high in cholesterol Reviewed exercise goals and target of 150 minutes per week Lipid Panel     Component Value Date/Time   CHOL 315 (H) 06/25/2023 1027   TRIG 193 (H) 06/25/2023 1027   HDL 67 06/25/2023 1027   CHOLHDL 4.7 06/25/2023 1027   VLDL 37 (H) 04/16/2017 1000   LDLCALC 211 (H) 06/25/2023 1027       Interventions Today    Flowsheet Row Most Recent Value  Chronic Disease   Chronic disease during today's visit Hypertension (HTN), Other  [Hypothyroidism,  Hyperlipidemia]  General Interventions   General Interventions Discussed/Reviewed General Interventions Discussed, General Interventions Reviewed, Doctor Visits, Labs  Doctor Visits Discussed/Reviewed Doctor Visits Discussed, Doctor Visits Reviewed, PCP  Exercise Interventions   Exercise Discussed/Reviewed Exercise Discussed, Exercise Reviewed, Physical  Activity  Physical Activity Discussed/Reviewed Physical Activity Discussed, Physical Activity Reviewed, Types of exercise  Education Interventions   Education Provided Provided Education, Provided Web-based Education  Provided Verbal Education On Medication, When to see the doctor, Mental Health/Coping with Illness, Nutrition, Labs  Labs Reviewed Lipid Profile  [TSH]  Mental Health Interventions   Mental Health Discussed/Reviewed Mental Health Discussed, Mental Health Reviewed, Anxiety, Coping Strategies, Grief and Loss  Nutrition Interventions   Nutrition Discussed/Reviewed Nutrition Discussed, Nutrition Reviewed, Decreasing fats, Fluid intake  Pharmacy Interventions   Pharmacy Dicussed/Reviewed Pharmacy Topics Discussed, Pharmacy Topics Reviewed, Medications and their functions  Safety Interventions   Safety Discussed/Reviewed Safety Discussed, Safety Reviewed, Fall Risk, Home Safety          SDOH assessments and interventions completed:  No     Care Coordination Interventions:  Yes, provided   Follow up plan: Follow up call scheduled for 08/27/23 @11 :00 AM    Encounter Outcome:  Patient Visit Completed

## 2023-06-27 NOTE — Patient Instructions (Signed)
Visit Information  Thank you for taking time to visit with me today. Please don't hesitate to contact me if I can be of assistance to you.   Following are the goals we discussed today:   Goals Addressed             This Visit's Progress    COMPLETED: To avoid from having further falls       Care Coordination Interventions: Provided written and verbal education re: potential causes of falls and Fall prevention strategies Advised patient of importance of notifying provider of falls Assessed for falls since last encounter Assessed patients knowledge of fall risk prevention secondary to previously provided education      To work on lowering Cholesterol levels       Care Coordination Interventions: Provider established cholesterol goals reviewed Provided Cholesterol educational materials Reviewed role and benefits of statin for ASCVD risk reduction Reviewed importance of limiting foods high in cholesterol Reviewed exercise goals and target of 150 minutes per week Lipid Panel     Component Value Date/Time   CHOL 315 (H) 06/25/2023 1027   TRIG 193 (H) 06/25/2023 1027   HDL 67 06/25/2023 1027   CHOLHDL 4.7 06/25/2023 1027   VLDL 37 (H) 04/16/2017 1000   LDLCALC 211 (H) 06/25/2023 1027           Our next appointment is by telephone on 08/27/23 at 11:00 AM  Please call the care guide team at [phone] if you need to cancel or reschedule your appointment.   If you are experiencing a Mental Health or Behavioral Health Crisis or need someone to talk to, please call 1-800-273-TALK (toll free, 24 hour hotline)  Patient verbalizes understanding of instructions and care plan provided today and agrees to view in MyChart. Active MyChart status and patient understanding of how to access instructions and care plan via MyChart confirmed with patient.     Delsa Sale RN BSN CCM [place]  [facility], [facility] Nurse Care Coordinator  Direct Dial:  [phone] Website: [email or web address]

## 2023-08-25 ENCOUNTER — Other Ambulatory Visit: Payer: Self-pay | Admitting: Internal Medicine

## 2023-08-27 ENCOUNTER — Ambulatory Visit: Payer: Self-pay

## 2023-08-27 NOTE — Patient Outreach (Signed)
  Care Coordination   Follow Up Visit Note   08/27/2023 Name: Dominique Livingston MRN: [id number] DOB: Aug 12, 1933  Dominique Livingston is a 89 y.o. year old female who sees Baxley, Luanna Cole, MD for primary care. I spoke with  [name] by phone today.  What matters to the patients health and wellness today?  Patient would like to lower her Cholesterol levels by adding more exercise and cutting back on trans and saturated fats.     Goals Addressed             This Visit's Progress    To work on lowering Cholesterol levels   On track    Care Coordination Interventions: Provider established cholesterol goals reviewed Mailed printed educational materials related to Cholesterol management  Reviewed role and benefits of statin for ASCVD risk reduction Reviewed importance of limiting foods high in cholesterol Reviewed exercise goals and target of 150 minutes per week, mailed printed chair exercises Educated patient about [place] lipid clinic, encouraged patient to discuss with her PCP at next scheduled visit Patient will review the printed ed mats mailed by nurse care coordinator related to Cholesterol management  Patient will work on lowering her saturated and trans fats, reduce intake of processed and packaged foods and implement a routine exercise regimen as discussed Patient will ask her doctor about a referral to [facility] clinic if lipids remain to be significantly elevated at next lipid recheck Patient will keep her scheduled physical with Dr. Lenord Fellers scheduled for 06/27/24 @09 :50 AM labs/10:00 AM PCP Patient will continue to work with nurse care coordinator for chronic disease management and care coordination needs Lipid Panel     Component Value Date/Time   CHOL 315 (H) 06/25/2023 1027   TRIG 193 (H) 06/25/2023 1027   HDL 67 06/25/2023 1027   CHOLHDL 4.7 06/25/2023 1027   VLDL 37 (H) 04/16/2017 1000   LDLCALC 211 (H) 06/25/2023 1027      Interventions Today     Flowsheet Row Most Recent Value  Chronic Disease   Chronic disease during today's visit Other  [Hyperlipidemia,  rash to face]  General Interventions   General Interventions Discussed/Reviewed General Interventions Discussed, General Interventions Reviewed, Labs, Doctor Visits, Lipid Profile  Doctor Visits Discussed/Reviewed Doctor Visits Discussed, Doctor Visits Reviewed, PCP  Exercise Interventions   Exercise Discussed/Reviewed Physical Activity, Exercise Reviewed, Exercise Discussed  Physical Activity Discussed/Reviewed Physical Activity Discussed, Physical Activity Reviewed, Types of exercise  Education Interventions   Education Provided Provided Education, Provided Printed Education  Provided Verbal Education On When to see the doctor, Nutrition, Labs, Medication, Exercise  Labs Reviewed Lipid Profile  Nutrition Interventions   Nutrition Discussed/Reviewed Nutrition Discussed, Nutrition Reviewed, Adding fruits and vegetables  [Adding Soluble fiber]  Pharmacy Interventions   Pharmacy Dicussed/Reviewed Pharmacy Topics Discussed, Pharmacy Topics Reviewed, Medications and their functions  Safety Interventions   Safety Discussed/Reviewed Fall Risk, Home Safety, Safety Reviewed, Safety Discussed            SDOH assessments and interventions completed:  Yes   See SDOH Screenings: Physical Activity      Care Coordination Interventions:  Yes, provided   Follow up plan: Follow up call scheduled for 10/29/23 @11 :00 AM    Encounter Outcome:  Patient Visit Completed

## 2023-08-27 NOTE — Patient Instructions (Signed)
Visit Information  Thank you for taking time to visit with me today. Please don't hesitate to contact me if I can be of assistance to you.   Following are the goals we discussed today:   Goals Addressed             This Visit's Progress    To work on lowering Cholesterol levels   On track    Care Coordination Interventions: Provider established cholesterol goals reviewed Mailed printed educational materials related to Cholesterol management  Reviewed role and benefits of statin for ASCVD risk reduction Reviewed importance of limiting foods high in cholesterol Reviewed exercise goals and target of 150 minutes per week, mailed printed chair exercises Educated patient about [place] lipid clinic, encouraged patient to discuss with her PCP at next scheduled visit Patient will review the printed ed mats mailed by nurse care coordinator related to Cholesterol management  Patient will work on lowering her saturated and trans fats, reduce intake of processed and packaged foods and implement a routine exercise regimen as discussed Patient will ask her doctor about a referral to [facility] lipid clinic if lipids remain to be significantly elevated at next lipid recheck Patient will keep her scheduled physical with Dr. Lenord Fellers scheduled for 06/27/24 @09 :50 AM labs/10:00 AM PCP Patient will continue to work with nurse care coordinator for chronic disease management and care coordination needs Lipid Panel     Component Value Date/Time   CHOL 315 (H) 06/25/2023 1027   TRIG 193 (H) 06/25/2023 1027   HDL 67 06/25/2023 1027   CHOLHDL 4.7 06/25/2023 1027   VLDL 37 (H) 04/16/2017 1000   LDLCALC 211 (H) 06/25/2023 1027              Our next appointment is by telephone on 10/29/23 at 11:00 AM  Please call the care guide team at [phone] if you need to cancel or reschedule your appointment.   If you are experiencing a Mental Health or Behavioral Health Crisis or need someone to talk to,  please call 1-800-273-TALK (toll free, 24 hour hotline)  Patient verbalizes understanding of instructions and care plan provided today and agrees to view in MyChart. Active MyChart status and patient understanding of how to access instructions and care plan via MyChart confirmed with patient.     Delsa Sale RN BSN CCM [place]  [facility] Nurse Care Coordinator  Direct Dial: [phone] Website: [email or web address]

## 2023-09-03 ENCOUNTER — Other Ambulatory Visit: Payer: Self-pay | Admitting: Internal Medicine

## 2023-09-06 ENCOUNTER — Encounter: Payer: Self-pay | Admitting: Internal Medicine

## 2023-09-06 ENCOUNTER — Other Ambulatory Visit: Payer: Self-pay | Admitting: Internal Medicine

## 2023-09-06 NOTE — Telephone Encounter (Signed)
Copied from CRM [id number]. Topic: Clinical - Medication Refill >> Sep 06, 2023  3:40 PM Maxwell Marion wrote: Most Recent Primary Care Visit:  Provider: [facility]  Department: [facility]  Visit Type: LAB 5  Date: 06/25/2023  Medication: lisinopril (ZESTRIL) 2.5 MG tablet  Has the patient contacted their pharmacy? Yes, pharmacy advised there were no refills (Agent: If no, request that the patient contact the pharmacy for the refill. If patient does not wish to contact the pharmacy document the reason why and proceed with request.) (Agent: If yes, when and what did the pharmacy advise?)  Is this the correct pharmacy for this prescription? Yes If no, delete pharmacy and type the correct one.  This is the patient's preferred pharmacy:  [facility] [address] ([address] [place] - [address] [place] ([place]) [place] [address] Phone: [phone] Fax: [phone]   Has the prescription been filled recently? No  Is the patient out of the medication? No  Has the patient been seen for an appointment in the last year OR does the patient have an upcoming appointment? Yes  Can we respond through MyChart?   Agent: Please be advised that Rx refills may take up to 3 business days. We ask that you follow-up with your pharmacy.

## 2023-09-06 NOTE — Telephone Encounter (Signed)
Patient is requesting lisinopril 5mg . On her current med list she has 2.5 listed. I called her to confirm she has been taking 5mg  daily last filled on 05/16/23 #90  Ok to refill?

## 2023-10-29 ENCOUNTER — Ambulatory Visit: Payer: Self-pay

## 2023-10-29 NOTE — Patient Instructions (Signed)
Visit Information  Thank you for taking time to visit with me today. Please don't hesitate to contact me if I can be of assistance to you.   Following are the goals we discussed today:   Goals Addressed             This Visit's Progress    To work on lowering Cholesterol levels   On track    Care Coordination Interventions: Provider established cholesterol goals reviewed Reviewed role and benefits of statin for ASCVD risk reduction Reviewed importance of limiting foods high in cholesterol Reviewed exercise goals and target of 150 minutes per week, mailed printed chair exercises Educated patient about [place] lipid clinic, encouraged patient to discuss with her PCP at next scheduled visit Patient will review the printed ed mats mailed by nurse care coordinator related to Cholesterol management  Patient will work on lowering her saturated and trans fats, reduce intake of processed and packaged foods and implement a routine exercise regimen as discussed Patient will ask her doctor about a referral to [facility] clinic if lipids remain to be significantly elevated at next lipid recheck Patient will keep her scheduled physical with Dr. Lenord Fellers scheduled for 06/27/24 @09 :50 AM labs/10:00 AM PCP Patient will continue to work with nurse care coordinator for chronic disease management and care coordination needs Lipid Panel     Component Value Date/Time   CHOL 315 (H) 06/25/2023 1027   TRIG 193 (H) 06/25/2023 1027   HDL 67 06/25/2023 1027   CHOLHDL 4.7 06/25/2023 1027   VLDL 37 (H) 04/16/2017 1000   LDLCALC 211 (H) 06/25/2023 1027              Our next appointment is by telephone on 01/28/24 at 11:00 AM  Please call the care guide team at [phone] if you need to cancel or reschedule your appointment.   If you are experiencing a Mental Health or Behavioral Health Crisis or need someone to talk to, please call 1-800-273-TALK (toll free, 24 hour hotline)  Patient  verbalizes understanding of instructions and care plan provided today and agrees to view in MyChart. Active MyChart status and patient understanding of how to access instructions and care plan via MyChart confirmed with patient.     Delsa Sale RN BSN CCM [place]  [facility], [facility] Nurse Care Coordinator  Direct Dial: [phone] Website: [email or web address]

## 2023-10-29 NOTE — Patient Outreach (Signed)
  Care Coordination   Follow Up Visit Note   10/29/2023 Name: Dominique Livingston MRN: [id number] DOB: [birth year]  Dominique Livingston is a 89 y.o. year old female who sees Baxley, Luanna Cole, MD for primary care. I spoke with  [name] by phone today.  What matters to the patients health and wellness today?  Patient would like to work on adhering to her low fat diet and increasing her daily exercises.     Goals Addressed             This Visit's Progress    To work on lowering Cholesterol levels   On track    Care Coordination Interventions: Provider established cholesterol goals reviewed Reviewed role and benefits of statin for ASCVD risk reduction Reviewed importance of limiting foods high in cholesterol Reviewed exercise goals and target of 150 minutes per week, mailed printed chair exercises Educated patient about [place] lipid clinic, encouraged patient to discuss with her PCP at next scheduled visit Patient will review the printed ed mats mailed by nurse care coordinator related to Cholesterol management  Patient will work on lowering her saturated and trans fats, reduce intake of processed and packaged foods and implement a routine exercise regimen as discussed Patient will ask her doctor about a referral to [facility] lipid clinic if lipids remain to be significantly elevated at next lipid recheck Patient will keep her scheduled physical with Dr. Lenord Fellers scheduled for 06/27/24 @09 :50 AM labs/10:00 AM PCP Patient will continue to work with nurse care coordinator for chronic disease management and care coordination needs Lipid Panel     Component Value Date/Time   CHOL 315 (H) 06/25/2023 1027   TRIG 193 (H) 06/25/2023 1027   HDL 67 06/25/2023 1027   CHOLHDL 4.7 06/25/2023 1027   VLDL 37 (H) 04/16/2017 1000   LDLCALC 211 (H) 06/25/2023 1027          Interventions Today    Flowsheet Row Most Recent Value  Chronic Disease   Chronic disease during today's visit Other   [Hyperlipidemia]  General Interventions   General Interventions Discussed/Reviewed General Interventions Discussed, General Interventions Reviewed, Doctor Visits, Labs  Doctor Visits Discussed/Reviewed Doctor Visits Discussed, Doctor Visits Reviewed, PCP  Exercise Interventions   Exercise Discussed/Reviewed Exercise Discussed, Exercise Reviewed, Physical Activity  Physical Activity Discussed/Reviewed Types of exercise, Physical Activity Reviewed, Physical Activity Discussed  Education Interventions   Education Provided Provided Education  Provided Verbal Education On When to see the doctor, Medication, Exercise, Nutrition  Nutrition Interventions   Nutrition Discussed/Reviewed Nutrition Discussed, Nutrition Reviewed, Decreasing fats  Pharmacy Interventions   Pharmacy Dicussed/Reviewed Pharmacy Topics Discussed, Pharmacy Topics Reviewed, Medications and their functions          SDOH assessments and interventions completed:  Yes  SDOH Interventions Today    Flowsheet Row Most Recent Value  SDOH Interventions   Food Insecurity Interventions Intervention Not Indicated  Housing Interventions Intervention Not Indicated  Transportation Interventions Intervention Not Indicated  Utilities Interventions Intervention Not Indicated        Care Coordination Interventions:  Yes, provided   Follow up plan: Follow up call scheduled for 01/28/24 @11 :00 AM    Encounter Outcome:  Patient Visit Completed

## 2023-10-29 NOTE — Patient Outreach (Signed)
  Care Coordination   10/29/2023 Name: Dominique Livingston MRN: [id number] DOB: 02/19/1933   Care Coordination Outreach Attempts:  An unsuccessful outreach was attempted for an appointment today.  Follow Up Plan:  Additional outreach attempts will be made to offer the patient complex care management information and services.   Encounter Outcome:  No Answer   Care Coordination Interventions:  No, not indicated    Delsa Sale RN BSN CCM [place]  [facility], [facility] Nurse Care Coordinator  Direct Dial: [phone] Website: [email or web address]

## 2023-11-30 ENCOUNTER — Other Ambulatory Visit: Payer: Self-pay | Admitting: Internal Medicine

## 2023-12-20 ENCOUNTER — Telehealth: Payer: Self-pay | Admitting: Internal Medicine

## 2023-12-20 NOTE — Telephone Encounter (Signed)
 I called Dominique Livingston because I notice she had an appointment on 12/21/2023 that had been scheduled on 12/18/2023 for a UTI, so I was concerned if she actually had a UTI for that long.The symptoms she is having arethighs feel weak and ache, she feels discomfort in kidney area when she coughs or clears her throat and sometimes it spreads up to her back area and down her left abdomen area. No burning, no urgency, no fever, sometimes she sees a few bubbles in the commode after urinates.This has been going on for over a month

## 2023-12-21 ENCOUNTER — Ambulatory Visit (INDEPENDENT_AMBULATORY_CARE_PROVIDER_SITE_OTHER): Admitting: Internal Medicine

## 2023-12-21 ENCOUNTER — Encounter: Payer: Self-pay | Admitting: Internal Medicine

## 2023-12-21 VITALS — BP 114/64 | HR 60 | Temp 98.0°F | Wt 128.0 lb

## 2023-12-21 DIAGNOSIS — M545 Low back pain, unspecified: Secondary | ICD-10-CM | POA: Diagnosis not present

## 2023-12-21 DIAGNOSIS — F411 Generalized anxiety disorder: Secondary | ICD-10-CM

## 2023-12-21 DIAGNOSIS — I10 Essential (primary) hypertension: Secondary | ICD-10-CM | POA: Diagnosis not present

## 2023-12-21 DIAGNOSIS — G8929 Other chronic pain: Secondary | ICD-10-CM

## 2023-12-21 DIAGNOSIS — R339 Retention of urine, unspecified: Secondary | ICD-10-CM

## 2023-12-21 DIAGNOSIS — E039 Hypothyroidism, unspecified: Secondary | ICD-10-CM | POA: Diagnosis not present

## 2023-12-21 DIAGNOSIS — R300 Dysuria: Secondary | ICD-10-CM

## 2023-12-21 LAB — POC URINALSYSI DIPSTICK (AUTOMATED)
Bilirubin, UA: NEGATIVE
Glucose, UA: NEGATIVE
Ketones, UA: NEGATIVE
Leukocytes, UA: NEGATIVE
Nitrite, UA: NEGATIVE
Protein, UA: NEGATIVE
Spec Grav, UA: 1.015 (ref 1.010–1.025)
Urobilinogen, UA: NEGATIVE U/dL — AB
pH, UA: 6 (ref 5.0–8.0)

## 2023-12-21 MED ORDER — CYCLOBENZAPRINE HCL 5 MG PO TABS: 5.0000 mg | ORAL_TABLET | Freq: Every day | ORAL | 1 refills | Status: DC

## 2023-12-21 NOTE — Progress Notes (Signed)
 Dominique Care Team: Margaree Mackintosh, MD as PCP - General (Internal Medicine) Clarene Duke Karma Lew, RN as [facility] Care Management  Visit Date: 12/21/23  Subjective:   Chief Complaint  Dominique presents with   Back Pain    Pt c/o lower back pain for the past few weeks; Pt states she feels the pain/discomfort more so when she coughs or clears her throat. Pt denies any burning with urination. Pt has not taken anything to relieve symptoms.   Dominique Livingston,Female DOB:June 28, 1933,89 y.o. WUJ:[id number]   89 y.o. Female presents today for acute sick visit with Back Pain. Says that she has had back pain, worsened when coughing, since at least the end of 09/2023, when she was moving boxes around for Christmas. She says that is concerned she may have a UTI as the back is in the area of her kidneys and she has recently noticed that she is having some urinary retention, though she tries to fully empty out her bladder every time. UA is normal today.   Past Medical History:  Diagnosis Date   Allergy    Anxiety    Arthritis    hands , stiffness in knees & hips    Colon polyp, hyperplastic    Diverticulosis    Endometriosis    Fibrocystic breast disease    GERD (gastroesophageal reflux disease)    Hyperlipidemia    Hypertension    Hypothyroidism    IBS (irritable bowel syndrome)    Polymyalgia rheumatica (HCC)     Allergies  Allergen Reactions   Amoxicillin-Pot Clavulanate Nausea Only   Erythromycin Nausea And Vomiting   Mushroom Extract Complex (Obsolete) Other (See Comments)    Cantalope, adding enviromental allergens such as grass & trees Had allergy test   Latex Rash   Macrobid [Nitrofurantoin Monohyd Macro] Rash   Zetia [Ezetimibe] Rash    Family History  Problem Relation Age of Onset   Cancer Mother        type unknown   Hypertension Mother    Heart disease Father    COPD Father    Hypertension Daughter    Heart disease Son    Heart attack Son    Breast cancer Neg Hx     Social History   Social History Narrative   Not on file   Review of Systems  Genitourinary:  Negative for dysuria, frequency, hematuria and urgency.       (+) Urinary Retention  Musculoskeletal:  Positive for back pain.     Objective:  Vitals: BP 114/64   Pulse 60   Temp 98 F (36.7 C)   Wt 128 lb (58.1 kg)   SpO2 96%   BMI 24.19 kg/m   Physical Exam Vitals and nursing note reviewed.  Constitutional:      General: She is not in acute distress.    Appearance: Normal appearance. She is not toxic-appearing.  HENT:     Head: Normocephalic and atraumatic.  Pulmonary:     Effort: Pulmonary effort is normal.  Musculoskeletal:     Lumbar back: Negative right straight leg raise test and negative left straight leg raise test.  Skin:    General: Skin is warm and dry.  Neurological:     Mental Status: She is alert and oriented to person, place, and time. Mental status is at baseline.     Motor: Motor function is intact. No weakness.     Deep Tendon Reflexes:     Reflex Scores:  Patellar reflexes are 2+ on the right side and 2+ on the left side. Psychiatric:        Mood and Affect: Mood normal.        Behavior: Behavior normal.        Thought Content: Thought content normal.        Judgment: Judgment normal.     Results:  Studies Obtained And Personally Reviewed By Me: Labs:     Component Value Date/Time   NA 143 06/25/2023 1027   K 4.6 06/25/2023 1027   CL 102 06/25/2023 1027   CO2 30 06/25/2023 1027   GLUCOSE 91 06/25/2023 1027   BUN 21 06/25/2023 1027   CREATININE 0.91 06/25/2023 1027   CALCIUM 10.2 06/25/2023 1027   PROT 8.2 (H) 06/25/2023 1027   ALBUMIN 2.9 (L) 11/15/2022 0542   AST 20 06/25/2023 1027   ALT 14 06/25/2023 1027   ALKPHOS 55 11/15/2022 0542   BILITOT 0.6 06/25/2023 1027   GFRNONAA >60 11/15/2022 0542   GFRNONAA 62 05/28/2020 1202   GFRAA 72 05/28/2020 1202    Lab Results  Component Value Date   WBC 6.4 06/25/2023   HGB 12.5  06/25/2023   HCT 38.9 06/25/2023   MCV 93.7 06/25/2023   PLT 226 06/25/2023   Lab Results  Component Value Date   CHOL 315 (H) 06/25/2023   HDL 67 06/25/2023   LDLCALC 211 (H) 06/25/2023   TRIG 193 (H) 06/25/2023   CHOLHDL 4.7 06/25/2023   Lab Results  Component Value Date   HGBA1C 5.3 11/02/2016    Lab Results  Component Value Date   TSH 5.92 (H) 06/25/2023   Assessment & Plan:   Back Pain which had onset since at least the end of 09/2023, when she was moving boxes around for Christmas. She was concerned she may have a UTI as the back is in the area of her kidneys and she has recently noticed that she is having some urinary retention, though she tries to fully empty out her bladder every time. UA is normal today. Sending in Flexeril 5 mg - take 1 tablet (5 mg) by mouth at bedtime  for musculoskeletal  pain. If not improving, could refer for physical therapy.  Hypothyroidism treated with thyroid replacement medication.  Mild HTN- stable on current regimen.  Hx of anxiety treated wit Alprazolam.  I,Emily Lagle,acting as a scribe for Margaree Mackintosh, MD.,have documented all relevant documentation on the behalf of Margaree Mackintosh, MD,as directed by  Margaree Mackintosh, MD while in the presence of Margaree Mackintosh, MD.   I, Margaree Mackintosh, MD, have reviewed all documentation for this visit. The documentation on 12/29/23 for the exam, diagnosis, procedures, and orders are all accurate and complete.

## 2024-02-20 ENCOUNTER — Other Ambulatory Visit: Payer: Self-pay

## 2024-02-20 NOTE — Patient Instructions (Addendum)
 Visit Information  Thank you for taking time to visit with me today.   Please call the care guide team at [phone] if you need to cancel, schedule, or reschedule an appointment.   Please call 1-800-273-TALK (toll free, 24 hour hotline) if you are experiencing a Mental Health or Behavioral Health Crisis or need someone to talk to.  Louanne Roussel RN BSN CCM [place]  [facility], [facility] Nurse Care Coordinator  Direct Dial: [phone] Website: [email or web address]

## 2024-02-20 NOTE — Patient Outreach (Signed)
 Complex Care Management   Visit Note  02/20/2024  Name:  Dominique Livingston MRN: [id number] DOB: June 01, 1933  Situation: Referral received for Complex Care Management related to Hyperlipidemia, Hypertension, Hypothyroidism. I obtained verbal consent from Patient.  Visit completed with patient on the phone.  Background:   Past Medical History:  Diagnosis Date   Allergy    Anxiety    Arthritis    hands , stiffness in knees & hips    Colon polyp, hyperplastic    Diverticulosis    Endometriosis    Fibrocystic breast disease    GERD (gastroesophageal reflux disease)    Hyperlipidemia    Hypertension    Hypothyroidism    IBS (irritable bowel syndrome)    Polymyalgia rheumatica (HCC)     Assessment: Patient Reported Symptoms:  Cognitive Cognitive Status: Alert and oriented to person, place, and time Cognitive/Intellectual Conditions Management [RPT]: None reported or documented in medical history or problem list   Health Maintenance Behaviors: Annual physical exam, Immunizations, Social activities, Exercise, Healthy diet Healing Pattern: Fast Health Facilitated by: Healthy diet, Rest  Neurological Neurological Review of Symptoms: Hearing changes Neurological Management Strategies: Routine screening Neurological Self-Management Outcome: 4 (good) Neurological Comment: patient plans to get her hearing checked  HEENT HEENT Symptoms Reported: Change or loss of hearing, Dryness, Runny nose (dry eyes) HEENT Conditions: Ear problem(s) (decreased hearing to right ear) HEENT Management Strategies: Routine screening, Medication therapy HEENT Self-Management Outcome: 4 (good) Ear problem(s) (decreased hearing to right ear)  Cardiovascular Cardiovascular Symptoms Reported: No symptoms reported Does patient have uncontrolled Hypertension?: No Cardiovascular Conditions: High blood cholesterol, Hypertension Cardiovascular Management Strategies: Medication therapy, Routine screening, Diet  modification Weight: 129 lb (58.5 kg) (patient reported) Cardiovascular Self-Management Outcome: 4 (good)  Respiratory Respiratory Symptoms Reported: No symptoms reported Respiratory Conditions: Seasonal allergies Respiratory Self-Management Outcome: 4 (good)  Endocrine Patient reports the following symptoms related to hypoglycemia or hyperglycemia : No symptoms reported Endocrine Conditions: Thyroid  disorder Endocrine Management Strategies: Medication therapy, Routine screening Endocrine Self-Management Outcome: 4 (good)  Gastrointestinal Gastrointestinal Symptoms Reported: No symptoms reported   Nutrition Risk Screen (CP): No indicators present  Genitourinary Genitourinary Symptoms Reported: No symptoms reported    Integumentary Integumentary Symptoms Reported: No symptoms reported    Musculoskeletal Musculoskelatal Symptoms Reviewed: No symptoms reported   Falls in the past year?: No Number of falls in past year: 1 or less Was there an injury with Fall?: No Fall Risk Category Calculator: 0 Patient Fall Risk Level: Low Fall Risk Patient at Risk for Falls Due to: History of fall(s) Fall risk Follow up: Falls evaluation completed, Education provided  Psychosocial Psychosocial Symptoms Reported: No symptoms reported   Major Change/Loss/Stressor/Fears (CP): Denies Quality of Family Relationships: involved, supportive, helpful Do you feel physically threatened by others?: No      02/20/2024    9:40 AM  Depression screen PHQ 2/9  Decreased Interest 0  Down, Depressed, Hopeless 0  PHQ - 2 Score 0    There were no vitals filed for this visit.  Medications Reviewed Today     Reviewed by Kaylene Pascal, RN (Registered Nurse) on 02/20/24 at [id number]  Med List Status: <None>   Medication Order Taking? Sig Documenting Provider Last Dose Status Informant  acetaminophen  (TYLENOL ) 325 MG tablet [id number] No Take 1-2 tablets (325-650 mg total) by mouth every 6 (six) hours as needed for  mild pain (pain score 1-3 or temp > 100.5).  Patient not taking: Reported on 02/20/2024   Sterling Eisenmenger,  PA-C Not Taking Active   ALPRAZolam  (XANAX ) 0.25 MG tablet [id number] Yes Take 1 tablet (0.25 mg total) by mouth 2 (two) times daily as needed for anxiety. Sylvan Evener, MD Taking Active   cyclobenzaprine  (FLEXERIL ) 5 MG tablet [id number] No Take 1 tablet (5 mg total) by mouth at bedtime.  Patient not taking: Reported on 02/20/2024   Sylvan Evener, MD Not Taking Consider Medication Status and Discontinue (Completed Course)   docusate sodium  (COLACE) 100 MG capsule [id number] No Take 1 capsule (100 mg total) by mouth 2 (two) times daily.  Patient not taking: Reported on 02/20/2024   Geradine Kitten Not Taking Active     Discontinued 04/10/20 1041   levothyroxine  (SYNTHROID ) 75 MCG tablet [id number] Yes Take 1 tablet by mouth once daily Sylvan Evener, MD Taking Active   lisinopril  (ZESTRIL ) 2.5 MG tablet [id number]  Take 1 tablet (2.5 mg total) by mouth daily. Sterling Eisenmenger, PA-C  Consider Medication Status and Discontinue (Change in therapy)   lisinopril  (ZESTRIL ) 5 MG tablet [id number] Yes Take 1 tablet by mouth once daily Sylvan Evener, MD Taking Active   Multiple Vitamin (MULTIVITAMIN) tablet [id number] Yes Take 1 tablet by mouth daily. [provider] Taking Active Self           Med Note Jillyn Motto   Tue Jun 21, 2021 10:13 AM)    OVER THE COUNTER MEDICATION [id number] Yes Take by mouth 3 (three) times daily. Equate Prebiotic fiber supplement. Patient is taking 2-3 times daily. [provider] Taking Active   Polyethyl Glycol-Propyl Glycol (SYSTANE ULTRA OP) [id number] Yes Place 1 drop into both eyes 2 (two) times daily. [provider] Taking Active Self  polyethylene glycol (MIRALAX  / GLYCOLAX ) 17 g packet [id number] No Take 17 g by mouth daily as needed for mild constipation.  Patient not taking: Reported on 02/20/2024   Akula, Vijaya, MD Not  Taking Active   psyllium (METAMUCIL SMOOTH TEXTURE) 28 % packet [id number] No Take 1 packet by mouth 2 (two) times daily.  Patient not taking: Reported on 02/20/2024   [provider] Not Taking Active Self           Med Note (Marialice Newkirk L   Wed Feb 20, 2024  9:23 AM)    traMADol  (ULTRAM ) 50 MG tablet [id number] No Take 1-2 tablets (50-100 mg total) by mouth every 6 (six) hours as needed for moderate pain.  Patient not taking: Reported on 02/20/2024   Sterling Eisenmenger, PA-C Not Taking Active   traZODone  (DESYREL ) 150 MG tablet [id number] No Take 0.5 tablets (75 mg total) by mouth at bedtime as needed for sleep.  Patient not taking: Reported on 02/20/2024   Sterling Eisenmenger, PA-C Not Taking Active             Recommendation:   PCP Follow-up  Follow Up Plan:   Closing From:  Complex Care Management  Louanne Roussel RN BSN CCM [facility]  [facility], [facility] Nurse Care Coordinator  Direct Dial: [phone] Website: [email or web address]

## 2024-03-07 ENCOUNTER — Other Ambulatory Visit: Payer: Self-pay | Admitting: Internal Medicine

## 2024-05-02 ENCOUNTER — Ambulatory Visit: Admitting: Internal Medicine

## 2024-05-02 ENCOUNTER — Telehealth: Payer: Self-pay

## 2024-05-02 ENCOUNTER — Ambulatory Visit (INDEPENDENT_AMBULATORY_CARE_PROVIDER_SITE_OTHER): Admitting: Internal Medicine

## 2024-05-02 DIAGNOSIS — I10 Essential (primary) hypertension: Secondary | ICD-10-CM

## 2024-05-02 DIAGNOSIS — F411 Generalized anxiety disorder: Secondary | ICD-10-CM

## 2024-05-02 DIAGNOSIS — U071 COVID-19: Secondary | ICD-10-CM

## 2024-05-02 DIAGNOSIS — E039 Hypothyroidism, unspecified: Secondary | ICD-10-CM

## 2024-05-02 MED ORDER — AMOXICILLIN 250 MG PO CAPS: 250.0000 mg | ORAL_CAPSULE | Freq: Three times a day (TID) | ORAL | 0 refills | Status: DC

## 2024-05-02 MED ORDER — BENZONATATE 100 MG PO CAPS: 100.0000 mg | ORAL_CAPSULE | Freq: Three times a day (TID) | ORAL | 0 refills | Status: DC | PRN

## 2024-05-02 NOTE — Telephone Encounter (Signed)
 Called patient she tested positive for COVID on Tuesday of this week, she started off with a cough but now she just has a sore throat and feverish. Patient does not have access to a phone or computer with a camera and prefers to do a telephone visit, I have booked her today at 3:00pm.    Copied from CRM [id number]. Topic: Clinical - Medical Advice >> May 02, 2024 11:31 AM Vena H wrote: Reason for CRM: Pt just tested positive for covid and besides having a slight fever through out the day and sore throat, she is wanting to know if there is anything else she should be doing. Please reach out to pt

## 2024-05-02 NOTE — Progress Notes (Signed)
 Patient Care Team: Perri Ronal PARAS, MD as PCP - General (Internal Medicine) Morgan Clayborne CROME, RN as [facility] Care Management  I connected with Dominique Livingston on 05/02/24 at 3:35 PM by telephone visit and verified that I am speaking with the correct person using two identifiers, myself and [name], CMA. I am in my office and patient is in their home.    I discussed the limitations, risks, security and privacy concerns of performing an evaluation and management service by telemedicine and the availability of in-person appointments. I also discussed with the patient that there may be a patient responsible charge related to this service. The patient expressed understanding and agreed to proceed.   Other persons participating in the visit and their role in the encounter: Medical scribe, Damien Blanks  Patient's location: Home  Provider's location: Clinic   No chief complaint on file.  Subjective:  Patient PI:Dominique Livingston,Female DOB:[birth year],89 y.o. FMW:[id number]   89 y.o. Female presents today for acute sick visit with Covid-19 Positive. Patient has a past medical history of HTN; Hypothyroidism; AKI; HLD. Symptoms reportedly began Tuesday with sneezing, cough, sore throat, and a low-grade temperature that did develop into a fever of 101. Denies nausea/vomiting/diarrhea, headache, decreased appetite, changes in BMs, or severe SOB.  Past Medical History:  Diagnosis Date   Allergy    Anxiety    Arthritis    hands , stiffness in knees & hips    Colon polyp, hyperplastic    Diverticulosis    Endometriosis    Fibrocystic breast disease    GERD (gastroesophageal reflux disease)    Hyperlipidemia    Hypertension    Hypothyroidism    IBS (irritable bowel syndrome)    Polymyalgia rheumatica (HCC)     Allergies  Allergen Reactions   Amoxicillin -Pot Clavulanate Nausea Only   Erythromycin Nausea And Vomiting   Mushroom Extract Complex (Obsolete) Other (See Comments)    Cantalope,  adding enviromental allergens such as grass & trees Had allergy test   Latex Rash   Macrobid  [Nitrofurantoin  Monohyd Macro] Rash   Zetia  [Ezetimibe ] Rash   Family History  Problem Relation Age of Onset   Cancer Mother        type unknown   Hypertension Mother    Heart disease Father    COPD Father    Hypertension Daughter    Heart disease Son    Heart attack Son    Breast cancer Neg Hx    Social History   Social History Narrative   Not on file   Review of Systems  Constitutional:  Positive for fever.       (-) Decreased Appetite  HENT:  Positive for sore throat.        (+) Sneezing  Respiratory:  Positive for cough. Negative for shortness of breath.   Gastrointestinal:  Negative for constipation, diarrhea, nausea and vomiting.  Neurological:  Negative for headaches.     Objective:  Vitals: There were no vitals taken for this visit.  Physical Exam Vitals and nursing note reviewed.  Neurological:     Mental Status: She is oriented to person, place, and time. Mental status is at baseline.  Psychiatric:        Attention and Perception: Attention normal.        Mood and Affect: Mood and affect normal.        Speech: Speech normal.        Behavior: Behavior normal.  Thought Content: Thought content normal.        Judgment: Judgment normal.   Limited due to the constraints of the visit.   Results:  Studies Obtained And Personally Reviewed By Me: Labs:     Component Value Date/Time   NA 143 06/25/2023 1027   K 4.6 06/25/2023 1027   CL 102 06/25/2023 1027   CO2 30 06/25/2023 1027   GLUCOSE 91 06/25/2023 1027   BUN 21 06/25/2023 1027   CREATININE 0.91 06/25/2023 1027   CALCIUM 10.2 06/25/2023 1027   PROT 8.2 (H) 06/25/2023 1027   ALBUMIN 2.9 (L) 11/15/2022 0542   AST 20 06/25/2023 1027   ALT 14 06/25/2023 1027   ALKPHOS 55 11/15/2022 0542   BILITOT 0.6 06/25/2023 1027   GFRNONAA >60 11/15/2022 0542   GFRNONAA 62 05/28/2020 1202   GFRAA 72 05/28/2020  1202    Lab Results  Component Value Date   WBC 6.4 06/25/2023   HGB 12.5 06/25/2023   HCT 38.9 06/25/2023   MCV 93.7 06/25/2023   PLT 226 06/25/2023   Lab Results  Component Value Date   CHOL 315 (H) 06/25/2023   HDL 67 06/25/2023   LDLCALC 211 (H) 06/25/2023   TRIG 193 (H) 06/25/2023   CHOLHDL 4.7 06/25/2023   Lab Results  Component Value Date   HGBA1C 5.3 11/02/2016    Lab Results  Component Value Date   TSH 5.92 (H) 06/25/2023    Assessment & Plan:  I provided 20 minutes of time spent during this encounter, including chart review, interviewing patient, medical decision making, and e-scribing medication with > 50% was spent counseling as documented under my assessment & plan.  Meds ordered this encounter  Medications   amoxicillin  (AMOXIL ) 250 MG capsule    Sig: Take 1 capsule (250 mg total) by mouth 3 (three) times daily.    Dispense:  21 capsule    Refill:  0   benzonatate  (TESSALON ) 100 MG capsule    Sig: Take 1 capsule (100 mg total) by mouth 3 (three) times daily as needed.    Dispense:  30 capsule    Refill:  0  COVID-19: symptoms reportedly began Tuesday with sneezing, cough, sore throat, and a low-grade temperature that did develop into a fever of 101. No nausea/vomiting/diarrhea, headache, decreased appetite, changes in BMs, or severe SOB. Sending in Amoxicillin  250 mg - take 1 capsule (250 mg total) by mouth 3 (three) times daily for 10 days and 100 mg Tessalon  Perles -  take 1 capsule (100 mg total) by mouth 3 (three) times daily as needed. Stay well rested, well hydrated, and well nourished. Contact us  if symptoms worsen/persist despite treatment. Walk around your home some to prevent atelectasis. Call us  if you develop shortness of breath. Quarantine at home for 5 days.  Monitor BP at home. Continue current meds as prescribed for hypothyroidism and hypertension. Continue Desyrel  for anxiety/ depression.    I,Emily Lagle,acting as a Neurosurgeon for Ronal JINNY Hailstone, MD.,have documented all relevant documentation on the behalf of Ronal JINNY Hailstone, MD,as directed by  Ronal JINNY Hailstone, MD while in the presence of Ronal JINNY Hailstone, MD.    I, Ronal JINNY Hailstone, MD, have reviewed all documentation for this visit. The documentation on 05/02/2024 for the exam, diagnosis, procedures, and orders are all accurate and complete.

## 2024-05-06 ENCOUNTER — Encounter: Payer: Self-pay | Admitting: Internal Medicine

## 2024-06-04 ENCOUNTER — Other Ambulatory Visit: Payer: Self-pay | Admitting: Internal Medicine

## 2024-06-26 NOTE — Progress Notes (Signed)
 Annual Wellness Visit   Patient Care Team: Tyjae Issa, Ronal PARAS, MD as PCP - General (Internal Medicine) Morgan Clayborne CROME, RN as [facility] Care Management  Visit Date: 06/27/24   Chief Complaint  Patient presents with   Annual Exam   Subjective:  Patient: Dominique Livingston, Female DOB: 06-Oct-1932, 89 y.o. MRN: [id number] Vitals:   06/27/24 0958  BP: 130/70   Dominique Livingston is a 89 y.o. Female who presents today for her Annual Wellness Visit. Patient has Essential hypertension; Hyperlipidemia; Hypothyroidism; Allergic rhinitis; History of polymyalgia rheumatica; Colitis; AKI (acute kidney injury); Generalized abdominal pain; Diarrhea; Hip fracture (HCC); Femoral neck fracture (HCC); Avulsion fracture of right ankle; Drug-induced constipation; and Right hip pain on their problem list.  She says that she is having trouble having regular bowel movements. She says she is either going to the bathroom 4-5 times a day or having 3 to 4 days of constipation. On 11/03/22 She went to the hospital for a right femur fracture and they prescribed her Colace and metamucil fiber packets for drug induced constipation.    Says she doesn't hear well out of her right ear and is going to have to get her hearing checked soon.   She says her appetite is good and that she feels good.    History of Anxiety and Insomnia treated with alprazolam  0.25 mg twice daily as needed.   History of Hypertension treated with Lisinopril  5 mg daily. Blood pressure today is normal at 130/70.   History of Hypothyroidism treated with Levothyroxine  75 mcg.   Mixed hyperlipidemia- is statin intolerant.    She was admitted to the hospital in early July 2023 with abdominal pain, nausea and vomiting.  She has history of partial colectomy with /enterocolonic colitis.  History of chronic constipation.  She was diagnosed with partial small bowel obstruction and irritable bowel syndrome.  CT showed diffuse dilatation and air-fluid levels  throughout the stomach, small bowel and colon likely due to an ileus.  There was concern for acute segmental colitis with possible inflammatory stricture.  There was edema/venous congestion within the lower abdominal small bowel mesentery.  It was treated conservatively with IV fluids along with metronidazole , Rocephin  and was discharged home with follow-up with [facility].  We saw her in follow-up on July 21 after her discharge on July 14.  She was complaining of black stools and frequent stools that were loose.  We contacted [facility] who suggested several days of Cipro  and metronidazole .  Stool for C. difficile was negative.   She has a history of allergic rhinitis, history of urticaria in 2021 thought to be due to anxiety after her husband passed away in the Fall 2020.   Surgery for endometriosis involving left salpingo-oophorectomy.  During that time there was obstruction of the sigmoid colon which resulted in left colectomy by Dr. Jannetta in 1969.  She subsequently had hysterectomy and right salpingo-oophorectomy by Dr. Gwenn Dames.   Remote history of polymyalgia rheumatica in 2001 resolved after treatment and has never recurred.   Osteopenia not on bone sparing medication.   06/27/2024 Labs are pending   08/29/2017 Mammogram no mammographic evidence of malignancy repeat in one year. Repeat in one year.    Colonoscopy last completed January 18, 2010 with removal of 3 small sessile polyps in sigmoid colon and sigmoid diverticulosis.    Vaccine counseling: Influenza and Shingles vaccines declined, Covid-19 vaccine deferred due to recently having Covid-19.   Health maintenance: repeat mammogram declined.  Health Maintenance  Topic Date Due   Zoster Vaccines- Shingrix (1 of 2) 09/26/2024 (Originally 02/17/1985)   COVID-19 Vaccine (3 - 2025-26 season) 10/06/2024 (Originally 06/02/2024)   Influenza Vaccine  12/30/2024 (Originally 05/02/2024)   Mammogram  06/27/2025 (Originally 08/29/2018)    DEXA SCAN  06/27/2025 (Originally 02/18/2000)   Medicare Annual Wellness (AWV)  06/27/2025   Pneumococcal Vaccine: 50+ Years  Completed   HPV VACCINES  Aged Out   Meningococcal B Vaccine  Aged Out   DTaP/Tdap/Td  Discontinued      Review of Systems  Constitutional:  Negative for fever and malaise/fatigue.  HENT:  Negative for congestion.   Eyes:  Negative for blurred vision.  Respiratory:  Negative for cough and shortness of breath.   Cardiovascular:  Negative for chest pain, palpitations and leg swelling.  Gastrointestinal:  Negative for vomiting.  Musculoskeletal:  Negative for back pain.  Skin:  Negative for rash.  Neurological:  Negative for loss of consciousness and headaches.   Objective:  Vitals: body mass index is 24.35 kg/m. Today's Vitals   06/27/24 0958  BP: 130/70  Pulse: 74  SpO2: 95%  Weight: 131 lb (59.4 kg)  Height: 5' 1.5 (1.562 m)  PainSc: 0-No pain   Physical Exam Vitals and nursing note reviewed.  Constitutional:      General: She is not in acute distress.    Appearance: Normal appearance. She is not ill-appearing or toxic-appearing.  HENT:     Head: Normocephalic and atraumatic.     Right Ear: Hearing, tympanic membrane, ear canal and external ear normal.     Left Ear: Hearing, tympanic membrane, ear canal and external ear normal.     Mouth/Throat:     Pharynx: Oropharynx is clear.  Eyes:     Extraocular Movements: Extraocular movements intact.     Pupils: Pupils are equal, round, and reactive to light.  Neck:     Thyroid : No thyroid  mass, thyromegaly or thyroid  tenderness.     Vascular: No carotid bruit.  Cardiovascular:     Rate and Rhythm: Normal rate and regular rhythm. No extrasystoles are present.    Pulses:          Dorsalis pedis pulses are 2+ on the right side and 2+ on the left side.     Heart sounds: Normal heart sounds, S1 normal and S2 normal. No murmur heard.    No friction rub. No gallop.  Pulmonary:     Effort: Pulmonary  effort is normal.     Breath sounds: Normal breath sounds. No decreased breath sounds, wheezing, rhonchi or rales.  Chest:     Chest wall: No mass.  Abdominal:     Palpations: Abdomen is soft. There is no hepatomegaly, splenomegaly or mass.     Tenderness: There is no abdominal tenderness.     Hernia: No hernia is present.  Musculoskeletal:     Cervical back: Normal range of motion.     Right lower leg: No edema.     Left lower leg: No edema.  Lymphadenopathy:     Cervical: No cervical adenopathy.     Upper Body:     Right upper body: No supraclavicular adenopathy.     Left upper body: No supraclavicular adenopathy.  Skin:    General: Skin is warm and dry.  Neurological:     General: No focal deficit present.     Mental Status: She is alert and oriented to person, place, and time. Mental status is at baseline.  Sensory: Sensation is intact.     Motor: Motor function is intact. No weakness.     Deep Tendon Reflexes: Reflexes are normal and symmetric.  Psychiatric:        Attention and Perception: Attention normal.        Mood and Affect: Mood normal.        Speech: Speech normal.        Behavior: Behavior normal.        Thought Content: Thought content normal.        Cognition and Memory: Cognition normal.        Judgment: Judgment normal.     Current Outpatient Medications  Medication Instructions   acetaminophen  (TYLENOL ) 325-650 mg, Oral, Every 6 hours PRN   ALPRAZolam  (XANAX ) 0.25 mg, Oral, 2 times daily PRN   docusate sodium  (COLACE) 100 mg, Oral, 2 times daily   levothyroxine  (SYNTHROID ) 75 mcg, Oral, Daily   lisinopril  (ZESTRIL ) 5 MG tablet Take 1 tablet by mouth once daily   lisinopril  (ZESTRIL ) 2.5 mg, Oral, Daily   Multiple Vitamin (MULTIVITAMIN) tablet 1 tablet, Daily   OVER THE COUNTER MEDICATION 3 times daily   Polyethyl Glycol-Propyl Glycol (SYSTANE ULTRA OP) 1 drop, 2 times daily   Past Medical History:  Diagnosis Date   Allergy    Anxiety     Arthritis    hands , stiffness in knees & hips    Colon polyp, hyperplastic    Diverticulosis    Endometriosis    Fibrocystic breast disease    GERD (gastroesophageal reflux disease)    Hyperlipidemia    Hypertension    Hypothyroidism    IBS (irritable bowel syndrome)    Polymyalgia rheumatica    Medical/Surgical History Narrative:  Allergic/Intolerant to:  Allergies  Allergen Reactions   Amoxicillin -Pot Clavulanate Nausea Only   Erythromycin Nausea And Vomiting   Mushroom Extract Complex (Obsolete) Other (See Comments)    Cantalope, adding enviromental allergens such as grass & trees Had allergy test   Latex Rash   Macrobid  [Nitrofurantoin  Monohyd Macro] Rash   Zetia  [Ezetimibe ] Rash   Past Surgical History:  Procedure Laterality Date   ABDOMINAL HYSTERECTOMY     APPENDECTOMY     BREAST BIOPSY Right    CATARACT EXTRACTION, BILATERAL Bilateral    cataracts   CHOLECYSTECTOMY N/A 05/09/2016   Procedure: LAPAROSCOPIC CHOLECYSTECTOMY WITH INTRAOPERATIVE CHOLANGIOGRAM;  Surgeon: Krystal Russell, MD;  Location: [facility] OR;  Service: General;  Laterality: N/A;   LEFT COLECTOMY     removed about 18 in due to endometriosos   TOTAL HIP ARTHROPLASTY Right 11/05/2022   Procedure: TOTAL HIP ARTHROPLASTY ANTERIOR APPROACH;  Surgeon: Melodi Lerner, MD;  Location: [facility];  Service: Orthopedics;  Laterality: Right;   Family History  Problem Relation Age of Onset   Cancer Mother        type unknown   Hypertension Mother    Heart disease Father    COPD Father    Hypertension Daughter    Heart disease Son    Heart attack Son    Breast cancer Neg Hx    Family History Narrative: Father died at age 30 with pneumonia/COPD/heart problems. Mother died at age 44 of cancer that was metastatic from unknown primary. 2 brothers with hypertension. Daughter with history of hypertension and allergies.    Social History Narrative  She is a widow who resides alone. Husband was a retired Company secretary. She has  2 adult children.    Most Recent Health Risks Assessment:  Most Recent Social Determinants of Health (Including Hx of Tobacco, Alcohol , and Drug Use) SDOH Screenings   Food Insecurity: No Food Insecurity (02/20/2024)  Housing: Unknown (02/20/2024)  Transportation Needs: No Transportation Needs (02/20/2024)  Utilities: Not At Risk (02/20/2024)  Alcohol  Screen: Low Risk  (02/20/2024)  Depression (PHQ2-9): Low Risk  (02/20/2024)  Financial Resource Strain: Low Risk  (02/20/2024)  Physical Activity: Insufficiently Active (02/20/2024)  Social Connections: Moderately Integrated (02/20/2024)  Stress: No Stress Concern Present (02/20/2024)  Tobacco Use: Low Risk  (06/27/2024)  Health Literacy: Adequate Health Literacy (02/20/2024)   Social History   Tobacco Use   Smoking status: Never    Passive exposure: Past   Smokeless tobacco: Never  Vaping Use   Vaping status: Never Used  Substance Use Topics   Alcohol  use: No   Drug use: No    Most Recent Fall Risk Assessment:    02/20/2024    9:40 AM  Fall Risk   Falls in the past year? 0  Number falls in past yr: 0  Injury with Fall? 0  Risk for fall due to : History of fall(s)  Follow up Falls evaluation completed;Education provided   Most Recent Anxiety/Depression Screenings:    02/20/2024    9:40 AM 02/20/2024    9:15 AM  PHQ 2/9 Scores  PHQ - 2 Score 0 0       No data to display         Most Recent Cognitive Screening:    06/25/2023    9:49 AM  6CIT Screen  What Year? 0 points  What month? 0 points  What time? 0 points  Count back from 20 0 points  Months in reverse 0 points  Repeat phrase 0 points  Total Score 0 points   Most Recent Vision/Hearing Screenings:No results found. Results:  Studies Obtained And Personally Reviewed By Me: Diabetic Foot Exam - Simple   No data filed     08/29/2017 Mammogram no mammographic evidence of malignancy repeat in one year. Repeat in one year.    Colonoscopy last completed  January 18, 2010 with removal of 3 small sessile polyps in sigmoid colon and sigmoid diverticulosis.   Labs:  CBC w/ Differential Lab Results  Component Value Date   WBC 6.4 06/25/2023   RBC 4.15 06/25/2023   HGB 12.5 06/25/2023   HCT 38.9 06/25/2023   PLT 226 06/25/2023   MCV 93.7 06/25/2023   MCH 30.1 06/25/2023   MCHC 32.1 06/25/2023   RDW 12.1 06/25/2023   MPV 9.3 06/25/2023   LYMPHSABS 1,632 06/25/2023   MONOABS 0.4 11/15/2022   BASOSABS 58 06/25/2023    Comprehensive Metabolic Panel Lab Results  Component Value Date   NA 143 06/25/2023   K 4.6 06/25/2023   CL 102 06/25/2023   CO2 30 06/25/2023   GLUCOSE 91 06/25/2023   BUN 21 06/25/2023   CREATININE 0.91 06/25/2023   CALCIUM 10.2 06/25/2023   PROT 8.2 (H) 06/25/2023   ALBUMIN 2.9 (L) 11/15/2022   AST 20 06/25/2023   ALT 14 06/25/2023   ALKPHOS 55 11/15/2022   BILITOT 0.6 06/25/2023   EGFR 61 06/25/2023   GFRNONAA >60 11/15/2022   Lipid Panel  Lab Results  Component Value Date   CHOL 315 (H) 06/25/2023   HDL 67 06/25/2023   LDLCALC 211 (H) 06/25/2023   TRIG 193 (H) 06/25/2023   A1c Lab Results  Component Value Date   HGBA1C 5.3 11/02/2016    TSH Lab Results  Component Value Date   TSH 5.92 (H) 06/25/2023     Assessment & Plan:   Orders Placed This Encounter  Procedures   CBC with Differential/Platelet   Comprehensive metabolic panel with GFR   Lipid panel   TSH   She says that she is having trouble having regular bowel movements. She says she is either going to the bathroom 4-5 times a day or having 3 to 4 days of constipation.  On 11/03/22 She went to the hospital for a right femur fracture and they prescribed her Colace and metamucil fiber packets for drug induced constipation.   Advised to discontinue Colace and metamucil and take a couple of teaspoons of Miralax  every day in water  or coffee or dulcolax tablets up to 2 times a week.     Anxiety and Insomnia: treated with Alprazolam  0.25 mg  twice daily as needed.   Hypertension: treated with Lisinopril  5 mg daily. Blood pressure today is normal at 130/70.   Hypothyroidism: treated with Levothyroxine  75 mcg.  Mixed hyperlipidemia- is statin intolerant.   Osteopenia not on bone sparing medication.   08/29/2017 Mammogram no mammographic evidence of malignancy repeat in one year. Repeat in one year.    Colonoscopy last completed January 18, 2010 with removal of 3 small sessile polyps in sigmoid colon and sigmoid diverticulosis.    Vaccine counseling: Influenza and Shingles vaccines declined, Covid-19 vaccine deferred due to recently having Covid-19.   Health maintenance: Repeat mammogram declined.      Annual Wellness Visit done today including the all of the following: Reviewed patient's Family Medical History Reviewed patient's SDOH and reviewed tobacco, alcohol , and drug use.  Reviewed and updated list of patient's medical providers Assessment of cognitive impairment was done Assessed patient's functional ability Established a written schedule for health screening services Health Risk Assessent Completed and Reviewed  Discussed health benefits of physical activity, and encouraged her to engage in regular exercise appropriate for her age and condition.    I,Makayla C Reid,acting as a scribe for Ronal JINNY Hailstone, MD.,have documented all relevant documentation on the behalf of Ronal JINNY Hailstone, MD,as directed by  Ronal JINNY Hailstone, MD while in the presence of Ronal JINNY Hailstone, MD.  I, Ronal JINNY Hailstone, MD, have reviewed all documentation for and agree with the above Annual Wellness Visit documentation.  Ronal JINNY Hailstone, MD Internal Medicine 06/27/2024

## 2024-06-27 ENCOUNTER — Encounter: Payer: Self-pay | Admitting: Internal Medicine

## 2024-06-27 ENCOUNTER — Other Ambulatory Visit: Payer: Medicare Other

## 2024-06-27 ENCOUNTER — Ambulatory Visit (INDEPENDENT_AMBULATORY_CARE_PROVIDER_SITE_OTHER): Payer: Medicare Other | Admitting: Internal Medicine

## 2024-06-27 VITALS — BP 130/70 | HR 74 | Ht 61.5 in | Wt 131.0 lb

## 2024-06-27 DIAGNOSIS — E782 Mixed hyperlipidemia: Secondary | ICD-10-CM | POA: Diagnosis not present

## 2024-06-27 DIAGNOSIS — E039 Hypothyroidism, unspecified: Secondary | ICD-10-CM | POA: Diagnosis not present

## 2024-06-27 DIAGNOSIS — Z0000 Encounter for general adult medical examination without abnormal findings: Secondary | ICD-10-CM | POA: Diagnosis not present

## 2024-06-27 DIAGNOSIS — F411 Generalized anxiety disorder: Secondary | ICD-10-CM

## 2024-06-27 DIAGNOSIS — R198 Other specified symptoms and signs involving the digestive system and abdomen: Secondary | ICD-10-CM | POA: Diagnosis not present

## 2024-06-27 DIAGNOSIS — I10 Essential (primary) hypertension: Secondary | ICD-10-CM | POA: Diagnosis not present

## 2024-06-27 DIAGNOSIS — F5105 Insomnia due to other mental disorder: Secondary | ICD-10-CM | POA: Diagnosis not present

## 2024-06-27 DIAGNOSIS — F409 Phobic anxiety disorder, unspecified: Secondary | ICD-10-CM | POA: Diagnosis not present

## 2024-06-27 DIAGNOSIS — Z789 Other specified health status: Secondary | ICD-10-CM

## 2024-06-28 ENCOUNTER — Ambulatory Visit: Payer: Self-pay | Admitting: Internal Medicine

## 2024-06-28 LAB — COMPREHENSIVE METABOLIC PANEL WITH GFR
AG Ratio: 1.3 (calc) (ref 1.0–2.5)
ALT: 17 U/L (ref 6–29)
AST: 19 U/L (ref 10–35)
Albumin: 4.8 g/dL (ref 3.6–5.1)
Alkaline phosphatase (APISO): 51 U/L (ref 37–153)
BUN: 24 mg/dL (ref 7–25)
CO2: 30 mmol/L (ref 20–32)
Calcium: 10.2 mg/dL (ref 8.6–10.4)
Chloride: 101 mmol/L (ref 98–110)
Creat: 0.95 mg/dL (ref 0.60–0.95)
Globulin: 3.6 g/dL (ref 1.9–3.7)
Glucose, Bld: 102 mg/dL — ABNORMAL HIGH (ref 65–99)
Potassium: 5.2 mmol/L (ref 3.5–5.3)
Sodium: 141 mmol/L (ref 135–146)
Total Bilirubin: 0.5 mg/dL (ref 0.2–1.2)
Total Protein: 8.4 g/dL — ABNORMAL HIGH (ref 6.1–8.1)
eGFR: 57 mL/min/1.73m2 — ABNORMAL LOW (ref >=60)

## 2024-06-28 LAB — CBC WITH DIFFERENTIAL/PLATELET
Absolute Lymphocytes: 1762 {cells}/uL (ref 850–3900)
Absolute Monocytes: 369 {cells}/uL (ref 200–950)
Basophils Absolute: 60 {cells}/uL (ref 0–200)
Basophils Relative: 0.9 %
Eosinophils Absolute: 107 {cells}/uL (ref 15–500)
Eosinophils Relative: 1.6 %
HCT: 37 % (ref 35.0–45.0)
Hemoglobin: 11.9 g/dL (ref 11.7–15.5)
MCH: 30.1 pg (ref 27.0–33.0)
MCHC: 32.2 g/dL (ref 32.0–36.0)
MCV: 93.4 fL (ref 80.0–100.0)
MPV: 9.3 fL (ref 7.5–12.5)
Monocytes Relative: 5.5 %
Neutro Abs: 4402 {cells}/uL (ref 1500–7800)
Neutrophils Relative %: 65.7 %
Platelets: 222 Thousand/uL (ref 140–400)
RBC: 3.96 Million/uL (ref 3.80–5.10)
RDW: 12.9 % (ref 11.0–15.0)
Total Lymphocyte: 26.3 %
WBC: 6.7 Thousand/uL (ref 3.8–10.8)

## 2024-06-28 LAB — LIPID PANEL
Cholesterol: 292 mg/dL — ABNORMAL HIGH (ref <200)
HDL: 65 mg/dL (ref >=50)
LDL Cholesterol (Calc): 191 mg/dL — ABNORMAL HIGH
Non-HDL Cholesterol (Calc): 227 mg/dL — ABNORMAL HIGH (ref <130)
Total CHOL/HDL Ratio: 4.5 (calc) (ref <5.0)
Triglycerides: 188 mg/dL — ABNORMAL HIGH (ref <150)

## 2024-06-28 LAB — TSH: TSH: 1.33 m[IU]/L (ref 0.40–4.50)

## 2024-06-30 NOTE — Patient Instructions (Addendum)
 It was a pleasure to see you today. Mammogram declined by patient. Try Miralax  daily for irregularity 2 tsps daily in liquid and may use Dulcolax tabs if needed for constipation up to twice weekly. Continue current medications and may follow up in one year or as needed.

## 2024-07-02 ENCOUNTER — Ambulatory Visit

## 2024-07-16 ENCOUNTER — Ambulatory Visit (INDEPENDENT_AMBULATORY_CARE_PROVIDER_SITE_OTHER)

## 2024-07-16 ENCOUNTER — Ambulatory Visit

## 2024-07-16 VITALS — Ht 61.5 in | Wt 131.0 lb

## 2024-07-16 DIAGNOSIS — Z0000 Encounter for general adult medical examination without abnormal findings: Secondary | ICD-10-CM

## 2024-07-16 NOTE — Progress Notes (Signed)
 Subjective:   Dominique Livingston is a 89 y.o. female who presents for Medicare Annual (Subsequent) preventive examination.  Visit Complete: Virtual I connected with  Dominique Livingston on 07/16/24 by a audio enabled telemedicine application and verified that I am speaking with the correct person using two identifiers.  Patient Location: Home  Provider Location: Office/Clinic  I discussed the limitations of evaluation and management by telemedicine. The patient expressed understanding and agreed to proceed.  Vital Signs: Because this visit was a virtual/telehealth visit, some criteria may be missing or patient reported. Any vitals not documented were not able to be obtained and vitals that have been documented are patient reported.  Patient Medicare AWV questionnaire was completed by the patient on 07/16/2024; I have confirmed that all information answered by patient is correct and no changes since this date.  Cardiac Risk Factors include: advanced age (>70men, >26 women);dyslipidemia;hypertension     Objective:    Today's Vitals   07/16/24 1056 07/16/24 1057  Weight: 131 lb (59.4 kg)   Height: 5' 1.5 (1.562 m)   PainSc: 0-No pain 0-No pain   Body mass index is 24.35 kg/m.     07/16/2024   10:58 AM 06/25/2023    9:51 AM 11/09/2022    3:37 PM 11/04/2022   10:18 PM 11/03/2022   10:48 PM 06/22/2022   10:35 AM 04/08/2022   10:45 AM  Advanced Directives  Does Patient Have a Medical Advance Directive? Yes Yes No  No Yes No  Type of Advance Directive Living will Living will    Living will;Healthcare Power of Attorney   Does patient want to make changes to medical advance directive?      No - Patient declined   Would patient like information on creating a medical advance directive?   No - Patient declined No - Patient declined   No - Patient declined    Current Medications (verified) Outpatient Encounter Medications as of 07/16/2024  Medication Sig   acetaminophen  (TYLENOL ) 325 MG  tablet Take 1-2 tablets (325-650 mg total) by mouth every 6 (six) hours as needed for mild pain (pain score 1-3 or temp > 100.5).   ALPRAZolam  (XANAX ) 0.25 MG tablet Take 1 tablet (0.25 mg total) by mouth 2 (two) times daily as needed for anxiety.   docusate sodium  (COLACE) 100 MG capsule Take 1 capsule (100 mg total) by mouth 2 (two) times daily.   levothyroxine  (SYNTHROID ) 75 MCG tablet Take 1 tablet by mouth once daily   lisinopril  (ZESTRIL ) 2.5 MG tablet Take 1 tablet (2.5 mg total) by mouth daily.   lisinopril  (ZESTRIL ) 5 MG tablet Take 1 tablet by mouth once daily   Multiple Vitamin (MULTIVITAMIN) tablet Take 1 tablet by mouth daily.   OVER THE COUNTER MEDICATION Take by mouth 3 (three) times daily. Equate Prebiotic fiber supplement. Patient is taking 2-3 times daily.   Polyethyl Glycol-Propyl Glycol (SYSTANE ULTRA OP) Place 1 drop into both eyes 2 (two) times daily.   [DISCONTINUED] Hyoscyamine  Sulfate SL (LEVSIN /SL) 0.125 MG SUBL One tab sublingual  one half hour before eating   No facility-administered encounter medications on file as of 07/16/2024.    Allergies (verified) Amoxicillin -pot clavulanate, Erythromycin, Mushroom extract complex (obsolete), Latex, Macrobid  [nitrofurantoin  monohyd macro], and Zetia  [ezetimibe ]   History: Past Medical History:  Diagnosis Date   Allergy    Anxiety    Arthritis    hands , stiffness in knees & hips    Colon polyp, hyperplastic  Diverticulosis    Endometriosis    Fibrocystic breast disease    GERD (gastroesophageal reflux disease)    Hyperlipidemia    Hypertension    Hypothyroidism    IBS (irritable bowel syndrome)    Polymyalgia rheumatica    Past Surgical History:  Procedure Laterality Date   ABDOMINAL HYSTERECTOMY     APPENDECTOMY     BREAST BIOPSY Right    CATARACT EXTRACTION, BILATERAL Bilateral    cataracts   CHOLECYSTECTOMY N/A 05/09/2016   Procedure: LAPAROSCOPIC CHOLECYSTECTOMY WITH INTRAOPERATIVE CHOLANGIOGRAM;   Surgeon: Krystal Russell, MD;  Location: [facility] OR;  Service: General;  Laterality: N/A;   LEFT COLECTOMY     removed about 18 in due to endometriosos   TOTAL HIP ARTHROPLASTY Right 11/05/2022   Procedure: TOTAL HIP ARTHROPLASTY ANTERIOR APPROACH;  Surgeon: Melodi Lerner, MD;  Location: [facility];  Service: Orthopedics;  Laterality: Right;   Family History  Problem Relation Age of Onset   Cancer Mother        type unknown   Hypertension Mother    Heart disease Father    COPD Father    Hypertension Daughter    Heart disease Son    Heart attack Son    Breast cancer Neg Hx    Social History   Socioeconomic History   Marital status: Widowed    Spouse name: Not on file   Number of children: 2   Years of education: Not on file   Highest education level: Not on file  Occupational History   Occupation: retired  Tobacco Use   Smoking status: Never    Passive exposure: Past   Smokeless tobacco: Never  Vaping Use   Vaping status: Never Used  Substance and Sexual Activity   Alcohol  use: No   Drug use: No   Sexual activity: Not on file  Other Topics Concern   Not on file  Social History Narrative   Not on file   Social Drivers of Health   Financial Resource Strain: Low Risk  (07/16/2024)   Overall Financial Resource Strain (CARDIA)    Difficulty of Paying Living Expenses: Not hard at all  Food Insecurity: No Food Insecurity (07/16/2024)   Hunger Vital Sign    Worried About Running Out of Food in the Last Year: Never true    Ran Out of Food in the Last Year: Never true  Transportation Needs: No Transportation Needs (07/16/2024)   PRAPARE - Administrator, Civil Service (Medical): No    Lack of Transportation (Non-Medical): No  Physical Activity: Insufficiently Active (07/16/2024)   Exercise Vital Sign    Days of Exercise per Week: 2 days    Minutes of Exercise per Session: 30 min  Stress: No Stress Concern Present (02/20/2024)   [employer] of Occupational  Health - Occupational Stress Questionnaire    Feeling of Stress : Not at all  Social Connections: Moderately Integrated (07/16/2024)   Social Connection and Isolation Panel    Frequency of Communication with Friends and Family: More than three times a week    Frequency of Social Gatherings with Friends and Family: Twice a week    Attends Religious Services: More than 4 times per year    Active Member of [employer] or Organizations: Yes    Attends Banker Meetings: Never    Marital Status: Widowed    Tobacco Counseling Counseling given: No   Clinical Intake:  Pre-visit preparation completed: Yes  Pain : No/denies pain Pain Score:  0-No pain     BMI - recorded: 24.35 Nutritional Status: BMI of 19-24  Normal Nutritional Risks: None Diabetes: No  How often do you need to have someone help you when you read instructions, pamphlets, or other written materials from your doctor or pharmacy?: 1 - Never  Interpreter Needed?: No  Information entered by :: Kathlynn Porto, CMA   Activities of Daily Living    07/16/2024   10:59 AM  In your present state of health, do you have any difficulty performing the following activities:  Hearing? 1  Vision? 0  Difficulty concentrating or making decisions? 0  Walking or climbing stairs? 0  Dressing or bathing? 0  Doing errands, shopping? 1  Preparing Food and eating ? N  Using the Toilet? N  In the past six months, have you accidently leaked urine? N  Do you have problems with loss of bowel control? N  Managing your Medications? N  Managing your Finances? N  Housekeeping or managing your Housekeeping? N    Patient Care Team: Perri Ronal PARAS, MD as PCP - General (Internal Medicine) Morgan Clayborne CROME, RN as VBCI Care Management  Indicate any recent Medical Services you may have received from other than [facility] providers in the past year (date may be approximate).     Assessment:   This is a routine wellness examination for  [place].  Hearing/Vision screen No results found.   Goals Addressed   None    Depression Screen    07/16/2024   10:54 AM 02/20/2024    9:40 AM 02/20/2024    9:15 AM 06/25/2023   10:04 AM 06/22/2022   10:35 AM 05/31/2021    4:24 PM 05/28/2020   10:08 AM  PHQ 2/9 Scores  PHQ - 2 Score 0 0 0 0 0 0 0    Fall Risk    07/16/2024   10:59 AM 02/20/2024    9:40 AM 06/25/2023    9:43 AM 06/22/2022   10:35 AM 05/31/2021    4:24 PM  Fall Risk   Falls in the past year? 0 0 1 0 0  Number falls in past yr: 0 0 0 0 0  Injury with Fall? 0 0 0 0 0  Risk for fall due to : No Fall Risks History of fall(s) Impaired mobility No Fall Risks No Fall Risks  Follow up Falls evaluation completed;Education provided;Falls prevention discussed Falls evaluation completed;Education provided Falls prevention discussed Falls evaluation completed  Falls evaluation completed      Data saved with a previous flowsheet row definition    MEDICARE RISK AT HOME: Medicare Risk at Home Any stairs in or around the home?: No If so, are there any without handrails?: No Home free of loose throw rugs in walkways, pet beds, electrical cords, etc?: No Adequate lighting in your home to reduce risk of falls?: Yes Life alert?: No Use of a cane, walker or w/c?: No Grab bars in the bathroom?: No Shower chair or bench in shower?: Yes Elevated toilet seat or a handicapped toilet?: No  TIMED UP AND GO:  Was the test performed?  No    Cognitive Function:        07/16/2024   11:02 AM 06/25/2023    9:49 AM 06/22/2022   10:40 AM  6CIT Screen  What Year? 0 points 0 points 0 points  What month? 0 points 0 points 0 points  What time? 0 points 0 points 0 points  Count back from 20 0 points  0 points 0 points  Months in reverse 0 points 0 points 0 points  Repeat phrase 0 points 0 points 0 points  Total Score 0 points 0 points 0 points    Immunizations Immunization History  Administered Date(s) Administered    PFIZER(Purple Top)SARS-COV-2 Vaccination 12/27/2019, 01/20/2020   Pneumococcal Conjugate-13 11/06/2014   Pneumococcal Polysaccharide-23 12/07/2008   Tdap 03/08/2012   Zoster, Live 02/13/2010    TDAP status: Due, Education has been provided regarding the importance of this vaccine. Advised may receive this vaccine at local pharmacy or Health Dept. Aware to provide a copy of the vaccination record if obtained from local pharmacy or Health Dept. Verbalized acceptance and understanding.  Flu Vaccine status: Due, Education has been provided regarding the importance of this vaccine. Advised may receive this vaccine at local pharmacy or Health Dept. Aware to provide a copy of the vaccination record if obtained from local pharmacy or Health Dept. Verbalized acceptance and understanding.  Pneumococcal vaccine status: Up to date  Covid-19 vaccine status: Information provided on how to obtain vaccines.   Qualifies for Shingles Vaccine? Yes   Zostavax completed Yes   Shingrix Completed?: No.    Education has been provided regarding the importance of this vaccine. Patient has been advised to call insurance company to determine out of pocket expense if they have not yet received this vaccine. Advised may also receive vaccine at local pharmacy or Health Dept. Verbalized acceptance and understanding.  Screening Tests Health Maintenance  Topic Date Due   Zoster Vaccines- Shingrix (1 of 2) 09/26/2024 (Originally [birth year]/1985)   COVID-19 Vaccine (3 - 2025-26 season) 10/06/2024 (Originally 06/02/2024)   Influenza Vaccine  12/30/2024 (Originally 05/02/2024)   Mammogram  06/27/2025 (Originally 08/29/2018)   DEXA SCAN  06/27/2025 (Originally 02/18/2000)   Medicare Annual Wellness (AWV)  06/27/2025   Pneumococcal Vaccine: 50+ Years  Completed   Meningococcal B Vaccine  Aged Out   DTaP/Tdap/Td  Discontinued    Health Maintenance  There are no preventive care reminders to display for this patient.  Colorectal  cancer screening: No longer required.   Mammogram status: No longer required due to patient's preference.  Bone Density status: Ordered 07/16/2024. Pt provided with contact info and advised to call to schedule appt.  Lung Cancer Screening: (Low Dose CT Chest recommended if Age 83-80 years, 20 pack-year currently smoking OR have quit w/in 15years.) does not qualify.   Additional Screening:  Hepatitis C Screening: does not qualify; Completed   Vision Screening: Recommended annual ophthalmology exams for early detection of glaucoma and other disorders of the eye. Is the patient up to date with their annual eye exam?  Yes  Who is the provider or what is the name of the office in which the patient attends annual eye exams? [facility] If pt is not established with a provider, would they like to be referred to a provider to establish care? No .   Dental Screening: Recommended annual dental exams for proper oral hygiene  Community Resource Referral / Chronic Care Management: CRR required this visit?  No   CCM required this visit?  No     Plan:     I have personally reviewed and noted the following in the patient's chart:   Medical and social history Use of alcohol , tobacco or illicit drugs  Current medications and supplements including opioid prescriptions. Patient is not currently taking opioid prescriptions. Functional ability and status Nutritional status Physical activity Advanced  directives List of other physicians Hospitalizations, surgeries, and ER visits in previous 12 months Vitals Screenings to include cognitive, depression, and falls Referrals and appointments  In addition, I have reviewed and discussed with patient certain preventive protocols, quality metrics, and best practice recommendations. A written personalized care plan for preventive services as well as general preventive health recommendations were provided  to patient.     Detrich Rakestraw Zelda, CMA   07/16/2024   After Visit Summary: (Mail) Due to this being a telephonic visit, the after visit summary with patients personalized plan was offered to patient via mail I, Ronal JINNY Hailstone, MD, have reviewed all documentation for this visit. The documentation on 07/16/2024 for the exam, diagnosis, procedures, and orders are all accurate and complete.

## 2024-07-16 NOTE — Patient Instructions (Signed)
 Next appointment: Follow up in one year for your annual wellness visit 07/03/2025   Preventive Care 65 Years and Older, Female Preventive care refers to lifestyle choices and visits with your health care provider that can promote health and wellness. What does preventive care include? A yearly physical exam. This is also called an annual well check. Dental exams once or twice a year. Routine eye exams. Ask your health care provider how often you should have your eyes checked. Personal lifestyle choices, including: Daily care of your teeth and gums. Regular physical activity. Eating a healthy diet. Avoiding tobacco and drug use. Limiting alcohol  use. Practicing safe sex. Taking low-dose aspirin  every day. Taking vitamin and mineral supplements as recommended by your health care provider. What happens during an annual well check? The services and screenings done by your health care provider during your annual well check will depend on your age, overall health, lifestyle risk factors, and family history of disease. Counseling  Your health care provider may ask you questions about your: Alcohol  use. Tobacco use. Drug use. Emotional well-being. Home and relationship well-being. Sexual activity. Eating habits. History of falls. Memory and ability to understand (cognition). Work and work Astronomer. Reproductive health. Screening  You may have the following tests or measurements: Height, weight, and BMI. Blood pressure. Lipid and cholesterol levels. These may be checked every 5 years, or more frequently if you are over 88 years old. Skin check. Lung cancer screening. You may have this screening every year starting at age 19 if you have a 30-pack-year history of smoking and currently smoke or have quit within the past 15 years. Fecal occult blood test (FOBT) of the stool. You may have this test every year starting at age 64. Flexible sigmoidoscopy or colonoscopy. You may have a  sigmoidoscopy every 5 years or a colonoscopy every 10 years starting at age 44. Hepatitis C blood test. Hepatitis B blood test. Sexually transmitted disease (STD) testing. Diabetes screening. This is done by checking your blood sugar (glucose) after you have not eaten for a while (fasting). You may have this done every 1-3 years. Bone density scan. This is done to screen for osteoporosis. You may have this done starting at age 22. Mammogram. This may be done every 1-2 years. Talk to your health care provider about how often you should have regular mammograms. Talk with your health care provider about your test results, treatment options, and if necessary, the need for more tests. Vaccines  Your health care provider may recommend certain vaccines, such as: Influenza vaccine. This is recommended every year. Tetanus, diphtheria, and acellular pertussis (Tdap, Td) vaccine. You may need a Td booster every 10 years. Zoster vaccine. You may need this after age 64. Pneumococcal 13-valent conjugate (PCV13) vaccine. One dose is recommended after age 36. Pneumococcal polysaccharide (PPSV23) vaccine. One dose is recommended after age 59. Talk to your health care provider about which screenings and vaccines you need and how often you need them. This information is not intended to replace advice given to you by your health care provider. Make sure you discuss any questions you have with your health care provider. Document Released: 10/15/2015 Document Revised: 06/07/2016 Document Reviewed: 07/20/2015 Elsevier Interactive Patient Education  2017 [employer].  Fall Prevention in the Home Falls can cause injuries. They can happen to people of all ages. There are many things you can do to make your home safe and to help prevent falls. What can I do on the outside of  my home? Regularly fix the edges of walkways and driveways and fix any cracks. Remove anything that might make you trip as you walk through a  door, such as a raised step or threshold. Trim any bushes or trees on the path to your home. Use bright outdoor lighting. Clear any walking paths of anything that might make someone trip, such as rocks or tools. Regularly check to see if handrails are loose or broken. Make sure that both sides of any steps have handrails. Any raised decks and porches should have guardrails on the edges. Have any leaves, snow, or ice cleared regularly. Use sand or salt on walking paths during winter. Clean up any spills in your garage right away. This includes oil or grease spills. What can I do in the bathroom? Use night lights. Install grab bars by the toilet and in the tub and shower. Do not use towel bars as grab bars. Use non-skid mats or decals in the tub or shower. If you need to sit down in the shower, use a plastic, non-slip stool. Keep the floor dry. Clean up any water  that spills on the floor as soon as it happens. Remove soap buildup in the tub or shower regularly. Attach bath mats securely with double-sided non-slip rug tape. Do not have throw rugs and other things on the floor that can make you trip. What can I do in the bedroom? Use night lights. Make sure that you have a light by your bed that is easy to reach. Do not use any sheets or blankets that are too big for your bed. They should not hang down onto the floor. Have a firm chair that has side arms. You can use this for support while you get dressed. Do not have throw rugs and other things on the floor that can make you trip. What can I do in the kitchen? Clean up any spills right away. Avoid walking on wet floors. Keep items that you use a lot in easy-to-reach places. If you need to reach something above you, use a strong step stool that has a grab bar. Keep electrical cords out of the way. Do not use floor polish or wax that makes floors slippery. If you must use wax, use non-skid floor wax. Do not have throw rugs and other things  on the floor that can make you trip. What can I do with my stairs? Do not leave any items on the stairs. Make sure that there are handrails on both sides of the stairs and use them. Fix handrails that are broken or loose. Make sure that handrails are as long as the stairways. Check any carpeting to make sure that it is firmly attached to the stairs. Fix any carpet that is loose or worn. Avoid having throw rugs at the top or bottom of the stairs. If you do have throw rugs, attach them to the floor with carpet tape. Make sure that you have a light switch at the top of the stairs and the bottom of the stairs. If you do not have them, ask someone to add them for you. What else can I do to help prevent falls? Wear shoes that: Do not have high heels. Have rubber bottoms. Are comfortable and fit you well. Are closed at the toe. Do not wear sandals. If you use a stepladder: Make sure that it is fully opened. Do not climb a closed stepladder. Make sure that both sides of the stepladder are locked into place. Ask  someone to hold it for you, if possible. Clearly mark and make sure that you can see: Any grab bars or handrails. First and last steps. Where the edge of each step is. Use tools that help you move around (mobility aids) if they are needed. These include: Canes. Walkers. Scooters. Crutches. Turn on the lights when you go into a dark area. Replace any light bulbs as soon as they burn out. Set up your furniture so you have a clear path. Avoid moving your furniture around. If any of your floors are uneven, fix them. If there are any pets around you, be aware of where they are. Review your medicines with your doctor. Some medicines can make you feel dizzy. This can increase your chance of falling. Ask your doctor what other things that you can do to help prevent falls. This information is not intended to replace advice given to you by your health care provider. Make sure you discuss any  questions you have with your health care provider. Document Released: 07/15/2009 Document Revised: 02/24/2016 Document Reviewed: 10/23/2014 Elsevier Interactive Patient Education  2017 [employer].

## 2024-07-17 NOTE — Addendum Note (Signed)
 Addended by: PERRI RONAL PARAS on: 07/17/2024 03:12 PM   Modules accepted: Level of Service

## 2024-07-28 ENCOUNTER — Ambulatory Visit: Payer: Self-pay | Admitting: Internal Medicine

## 2024-07-28 ENCOUNTER — Ambulatory Visit (INDEPENDENT_AMBULATORY_CARE_PROVIDER_SITE_OTHER): Admitting: Internal Medicine

## 2024-07-28 ENCOUNTER — Encounter: Payer: Self-pay | Admitting: Internal Medicine

## 2024-07-28 VITALS — BP 116/70 | HR 78 | Temp 99.0°F | Ht 61.5 in | Wt 130.0 lb

## 2024-07-28 DIAGNOSIS — J0100 Acute maxillary sinusitis, unspecified: Secondary | ICD-10-CM | POA: Diagnosis not present

## 2024-07-28 DIAGNOSIS — M5416 Radiculopathy, lumbar region: Secondary | ICD-10-CM

## 2024-07-28 DIAGNOSIS — R509 Fever, unspecified: Secondary | ICD-10-CM | POA: Diagnosis not present

## 2024-07-28 DIAGNOSIS — E039 Hypothyroidism, unspecified: Secondary | ICD-10-CM

## 2024-07-28 DIAGNOSIS — I10 Essential (primary) hypertension: Secondary | ICD-10-CM

## 2024-07-28 DIAGNOSIS — J22 Unspecified acute lower respiratory infection: Secondary | ICD-10-CM

## 2024-07-28 DIAGNOSIS — F411 Generalized anxiety disorder: Secondary | ICD-10-CM

## 2024-07-28 DIAGNOSIS — F409 Phobic anxiety disorder, unspecified: Secondary | ICD-10-CM

## 2024-07-28 DIAGNOSIS — E782 Mixed hyperlipidemia: Secondary | ICD-10-CM

## 2024-07-28 DIAGNOSIS — R0981 Nasal congestion: Secondary | ICD-10-CM

## 2024-07-28 DIAGNOSIS — Z789 Other specified health status: Secondary | ICD-10-CM

## 2024-07-28 LAB — POCT FLU A/B STATUS
Influenza A, POC: NEGATIVE
Influenza B, POC: NEGATIVE

## 2024-07-28 LAB — POCT RAPID STREP A (OFFICE): Rapid Strep A Screen: NEGATIVE

## 2024-07-28 LAB — POC COVID19 BINAXNOW: SARS Coronavirus 2 Ag: NEGATIVE

## 2024-07-28 MED ORDER — AMOXICILLIN 250 MG PO CAPS: 250.0000 mg | ORAL_CAPSULE | Freq: Three times a day (TID) | ORAL | 0 refills | Status: DC

## 2024-07-28 MED ORDER — BENZONATATE 100 MG PO CAPS: 100.0000 mg | ORAL_CAPSULE | Freq: Three times a day (TID) | ORAL | 0 refills | Status: AC | PRN

## 2024-07-28 NOTE — Telephone Encounter (Signed)
 FYI Only or Action Required?: Action required by provider: update on patient condition.  Patient was last seen in primary care on 06/27/2024 by Perri Ronal PARAS, MD.  Called Nurse Triage reporting Fever.  Triage Disposition: See Physician Within 24 Hours  Patient/caregiver understands and will follow disposition?: No            Copied from CRM #[id number]. Topic: Clinical - Red Word Triage >> Jul 28, 2024  9:11 AM Alexandria E wrote: [facility] that prompted transfer to Nurse Triage: Potential Covid, patient has a temperature of 102 with a sore throat, with clear and milky white phlegm. Symptoms started this past Thursday night, 10/23. Reason for Disposition  Fever present > 3 days (72 hours)  Answer Assessment - Initial Assessment Questions Pt is unable to come in for an appointment today as her kids are out of town and she does not want to ask someone else and potentially expose them to what she has.Pt also is unable to do a virtual appointment. This RN educated pt on new-worsening symptoms and when to call back. Pt verbalized understanding and agrees to plan. Pt is hoping her PCP can send a medication to the following pharmacy:   [facility] [address] ([address] [place] - [address], [place]) [place] [address] Phone: [phone]  Fax: [phone]    Symptom onset: Thurs evening, worsened over weekend  Pt states it is almost identical to when she had COVID back in July; pt unable to test herself at home  Fever:100.2 F (clarified not 102 F) Sore throat Dizziness Phlegm (sometimes clear, other times cream colored); denies cough Lack of appetite  Denies difficulty breathing, chest pain  Protocols used: [facility]

## 2024-07-28 NOTE — Progress Notes (Shared)
 Patient Care Team: Perri Ronal PARAS, MD as PCP - General (Internal Medicine) Morgan Clayborne CROME, RN as [facility] Care Management  Visit Date: 07/28/24  Subjective:    [name] , Female   DOB: 1933-03-08, 89 y.o.    MRN: [id number]   89 y.o. Female presents today for Congestion and fever. Patient has a past medical history of Hypertension, Hyperlipidemia, Hypothyroidism.  She got sick on Thursday and initially had sore throat, Now she is experiencing a productive cough, congestion and a low grade fever. Covid-19, Influenza  and strep test were negative. She went to the dentist and then went out to lunch with a friend on Wednesday.    Past Medical History:  Diagnosis Date   Allergy    Anxiety    Arthritis    hands , stiffness in knees & hips    Colon polyp, hyperplastic    Diverticulosis    Endometriosis    Fibrocystic breast disease    GERD (gastroesophageal reflux disease)    Hyperlipidemia    Hypertension    Hypothyroidism    IBS (irritable bowel syndrome)    Polymyalgia rheumatica      Family History  Problem Relation Age of Onset   Cancer Mother        type unknown   Hypertension Mother    Heart disease Father    COPD Father    Hypertension Daughter    Heart disease Son    Heart attack Son    Breast cancer Neg Hx         Review of Systems  Constitutional:  Positive for fever. Negative for chills.  HENT:  Positive for congestion.   Respiratory:  Positive for cough and sputum production. Negative for shortness of breath.   Neurological:  Negative for headaches.        Objective:   Vitals: BP 116/70   Pulse 78   Temp 99 F (37.2 C) (Tympanic)   Ht 5' 1.5 (1.562 m)   Wt 130 lb (59 kg)   SpO2 93%   BMI 24.17 kg/m    Physical Exam Vitals and nursing note reviewed.  Constitutional:      General: She is not in acute distress.    Appearance: Normal appearance. She is not ill-appearing.  HENT:     Head: Normocephalic and  atraumatic.     Right Ear: Tympanic membrane, ear canal and external ear normal.     Left Ear: Tympanic membrane, ear canal and external ear normal.     Mouth/Throat:     Mouth: Mucous membranes are moist.     Pharynx: Oropharynx is clear. Posterior oropharyngeal erythema present. No oropharyngeal exudate.  Neck:     Vascular: No carotid bruit.  Pulmonary:     Effort: Pulmonary effort is normal.     Breath sounds: Normal breath sounds. No wheezing, rhonchi or rales.  Lymphadenopathy:     Cervical: No cervical adenopathy.  Skin:    General: Skin is warm and dry.  Neurological:     Mental Status: She is alert and oriented to person, place, and time. Mental status is at baseline.  Psychiatric:        Mood and Affect: Mood normal.        Behavior: Behavior normal.        Thought Content: Thought content normal.        Judgment: Judgment normal.       Results:     Labs:  Component Value Date/Time   NA 141 06/27/2024 1028   K 5.2 06/27/2024 1028   CL 101 06/27/2024 1028   CO2 30 06/27/2024 1028   GLUCOSE 102 (H) 06/27/2024 1028   BUN 24 06/27/2024 1028   CREATININE 0.95 06/27/2024 1028   CALCIUM 10.2 06/27/2024 1028   PROT 8.4 (H) 06/27/2024 1028   ALBUMIN 2.9 (L) 11/15/2022 0542   AST 19 06/27/2024 1028   ALT 17 06/27/2024 1028   ALKPHOS 55 11/15/2022 0542   BILITOT 0.5 06/27/2024 1028   GFRNONAA >60 11/15/2022 0542   GFRNONAA 62 05/28/2020 1202   GFRAA 72 05/28/2020 1202     Lab Results  Component Value Date   WBC 6.7 06/27/2024   HGB 11.9 06/27/2024   HCT 37.0 06/27/2024   MCV 93.4 06/27/2024   PLT 222 06/27/2024    Lab Results  Component Value Date   CHOL 292 (H) 06/27/2024   HDL 65 06/27/2024   LDLCALC 191 (H) 06/27/2024   TRIG 188 (H) 06/27/2024   CHOLHDL 4.5 06/27/2024    Lab Results  Component Value Date   HGBA1C 5.3 11/02/2016     Lab Results  Component Value Date   TSH 1.33 06/27/2024         Assessment & Plan:   Congestion  of nasal sinus: She got sick on Thursday and initially had sore throat, Now she is experiencing a productive cough,congestion and low grade fever. Covid-19, Influenza  and strep test were negative. She went to the dentist and then went out to lunch with a friend on Wednesday.    Tessalon  100 mg three times daily as needed for cough, Amoxicillin  250 mg three times daily prescribed.    I,Makayla C Reid,acting as a scribe for Ronal JINNY Hailstone, MD.,have documented all relevant documentation on the behalf of Ronal JINNY Hailstone, MD,as directed by  Ronal JINNY Hailstone, MD while in the presence of Ronal JINNY Hailstone, MD.

## 2024-07-30 NOTE — Patient Instructions (Addendum)
 We are sorry you are not feeling well. Take Amoxicillin  250mg   3 times daily for 10 days. Take Tessalon  perles as needed for cough. Rest and stay well hydrated. Patient has been advised to have radiofrequency ablation for low back pain

## 2024-09-02 ENCOUNTER — Other Ambulatory Visit: Payer: Self-pay | Admitting: Internal Medicine

## 2024-09-30 ENCOUNTER — Ambulatory Visit: Admitting: Internal Medicine

## 2024-09-30 ENCOUNTER — Encounter: Payer: Self-pay | Admitting: Internal Medicine

## 2024-09-30 ENCOUNTER — Ambulatory Visit: Payer: Self-pay

## 2024-09-30 VITALS — BP 130/78 | HR 88 | Temp 98.3°F | Ht 61.5 in | Wt 131.0 lb

## 2024-09-30 DIAGNOSIS — R350 Frequency of micturition: Secondary | ICD-10-CM | POA: Diagnosis not present

## 2024-09-30 DIAGNOSIS — E039 Hypothyroidism, unspecified: Secondary | ICD-10-CM

## 2024-09-30 DIAGNOSIS — I10 Essential (primary) hypertension: Secondary | ICD-10-CM

## 2024-09-30 DIAGNOSIS — R300 Dysuria: Secondary | ICD-10-CM

## 2024-09-30 DIAGNOSIS — F419 Anxiety disorder, unspecified: Secondary | ICD-10-CM

## 2024-09-30 DIAGNOSIS — N3000 Acute cystitis without hematuria: Secondary | ICD-10-CM

## 2024-09-30 LAB — POCT URINALYSIS DIP (CLINITEK)
Bilirubin, UA: NEGATIVE
Blood, UA: NEGATIVE
Glucose, UA: NEGATIVE mg/dL
Ketones, POC UA: NEGATIVE mg/dL
Nitrite, UA: NEGATIVE
POC PROTEIN,UA: NEGATIVE
Spec Grav, UA: 1.01
Urobilinogen, UA: 0.2 U/dL
pH, UA: 7

## 2024-09-30 MED ORDER — CIPROFLOXACIN HCL 250 MG PO TABS: 250.0000 mg | ORAL_TABLET | Freq: Two times a day (BID) | ORAL | 0 refills | Status: AC

## 2024-09-30 NOTE — Telephone Encounter (Signed)
 FYI Only or Action Required?: FYI only for provider: appointment scheduled on 09/30/24.  Patient was last seen in primary care on 07/28/2024 by Perri Ronal PARAS, MD.  Called Nurse Triage reporting Urinary Frequency and Dysuria.  Symptoms began several days ago.  Interventions attempted: Nothing.  Symptoms are: gradually worsening.  Triage Disposition: See Physician Within 24 Hours  Patient/caregiver understands and will follow disposition?: Yes            Copied from CRM [id number]. Topic: Clinical - Red Word Triage >> Sep 30, 2024 10:00 AM Mia F wrote: Red Word that prompted transfer to Nurse Triage: UTI symptoms. Frequency, burning when urinating, foul smelling urine and discomfort. Started over the weekend. Symptoms seem to be persistent not worse. Reason for Disposition  Age > 50 years  Answer Assessment - Initial Assessment Questions 1. SEVERITY: How bad is the pain?  (e.g., Scale 1-10; mild, moderate, or severe)     5/10 burning with urination and vaginal discomfort.  2. FREQUENCY: How many times have you had painful urination today?      3 times.  3. PATTERN: Is pain present every time you urinate or just sometimes?      Just sometimes.  4. ONSET: When did the painful urination start?      2-3 days.  5. FEVER: Do you have a fever? If Yes, ask: What is your temperature, how was it measured, and when did it start?     No.  6. PAST UTI: Have you had a urine infection before? If Yes, ask: When was the last time? and What happened that time?      Yes, unsure when last one was.  7. CAUSE: What do you think is causing the painful urination?  (e.g., UTI, scratch, Herpes sore)     UTI.  8. OTHER SYMPTOMS: Do you have any other symptoms? (e.g., blood in urine, flank pain, genital sores, urgency, vaginal discharge)     No vomiting, back or flank pain, blood in urine. She is also having frequency and foul smelling odor.  Protocols used: Urination  Pain - Female-A-AH

## 2024-09-30 NOTE — Patient Instructions (Addendum)
 We are sorry you are not feeling well today.  Please take Cipro  250 mg twice daily for 7 days.  Rest and stay well-hydrated.  We will contact you with results of your urine culture.  Call if not responding in 48 hours or sooner if worse.

## 2024-09-30 NOTE — Progress Notes (Signed)
 "   Patient Care Team: Perri Ronal PARAS, MD as PCP - General (Internal Medicine) Morgan Clayborne CROME, RN as [facility] Care Management  Visit Date: 09/30/2024  Subjective:    [name] , Female   DOB: Oct 07, 1932, 89 y.o.    MRN: [id number]   89 y.o. Female presents today for  Dysuria. Patient has a past medical history of Hypertension, Anxiety, Hyperlipidemia, Hypothyroidism.  She began to experiencing burning while urinating as well as urinary urgency and hesitancy around christmas. She denies having fever, chills, nausea, vomiting, or back pain.   She had a Enterococcus faecalis UTI in 2022 .   Today, Urinalysis shows urine yellow and cloudy with a large amount of leukocytes. Urine culture pending.  She is anxious to get something for her symptoms.    Past Medical History:  Diagnosis Date   Allergy    Anxiety    Arthritis    hands , stiffness in knees & hips    Colon polyp, hyperplastic    Diverticulosis    Endometriosis    Fibrocystic breast disease    GERD (gastroesophageal reflux disease)    Hyperlipidemia    Hypertension    Hypothyroidism    IBS (irritable bowel syndrome)    Polymyalgia rheumatica      Family History  Problem Relation Age of Onset   Cancer Mother        type unknown   Hypertension Mother    Heart disease Father    COPD Father    Hypertension Daughter    Heart disease Son    Heart attack Son    Breast cancer Neg Hx    Family History Narrative: Father died at age 3 with pneumonia/COPD/heart problems. Mother died at age 59 of cancer that was metastatic from unknown primary. 2 brothers with hypertension. Daughter with history of hypertension and allergies.     Social History Narrative  She is a widow who resides alone. Husband was a retired company secretary. She has 2 adult children.       Review of Systems  Constitutional:  Negative for chills and fever.  Gastrointestinal:  Negative for nausea and vomiting.  Genitourinary:  Positive  for dysuria and urgency.  Musculoskeletal:  Negative for back pain.        Objective:   Vitals: BP 130/78   Pulse 88   Temp 98.3 F (36.8 C)   Ht 5' 1.5 (1.562 m)   Wt 131 lb (59.4 kg)   SpO2 96%   BMI 24.35 kg/m    Physical Exam Vitals and nursing note reviewed.     She is slightly anxious but no acute distress. Urine dipstick results reviewed. She is not febrile and does not look toxic.  Results:   Labs:       Component Value Date/Time   NA 141 06/27/2024 1028   K 5.2 06/27/2024 1028   CL 101 06/27/2024 1028   CO2 30 06/27/2024 1028   GLUCOSE 102 (H) 06/27/2024 1028   BUN 24 06/27/2024 1028   CREATININE 0.95 06/27/2024 1028   CALCIUM 10.2 06/27/2024 1028   PROT 8.4 (H) 06/27/2024 1028   ALBUMIN 2.9 (L) 11/15/2022 0542   AST 19 06/27/2024 1028   ALT 17 06/27/2024 1028   ALKPHOS 55 11/15/2022 0542   BILITOT 0.5 06/27/2024 1028   GFRNONAA >60 11/15/2022 0542   GFRNONAA 62 05/28/2020 1202   GFRAA 72 05/28/2020 1202     Lab Results  Component Value Date  WBC 6.7 06/27/2024   HGB 11.9 06/27/2024   HCT 37.0 06/27/2024   MCV 93.4 06/27/2024   PLT 222 06/27/2024    Lab Results  Component Value Date   CHOL 292 (H) 06/27/2024   HDL 65 06/27/2024   LDLCALC 191 (H) 06/27/2024   TRIG 188 (H) 06/27/2024   CHOLHDL 4.5 06/27/2024    Lab Results  Component Value Date   HGBA1C 5.3 11/02/2016     Lab Results  Component Value Date   TSH 1.33 06/27/2024        Assessment & Plan:    Orders Placed This Encounter  Procedures   Urine Culture   POCT URINALYSIS DIP (CLINITEK)   Meds ordered this encounter  Medications   ciprofloxacin  (CIPRO ) 250 MG tablet    Sig: Take 1 tablet (250 mg total) by mouth 2 (two) times daily.    Dispense:  14 tablet    Refill:  0   Acute urinary tract infection presenting as Burning with urination: She began to experiencing burning while urinating as well as urinary urgency and hesitancy around christmas. She denies  having fever, chills, nausea, vomiting, or back pain. She had a Enterococcus faecalis UTI in 2022 that was treated with Cipro  250 mg twice daily. 09/30/2024 Urinalysis shows urine yellow and cloudy with a large amount of leukocytes.  Urine culture pending.     Cipro  250 mg twice daily  x 7 days prescribed.      I,Makayla C Reid,acting as a scribe for Ronal JINNY Hailstone, MD.,have documented all relevant documentation on the behalf of Ronal JINNY Hailstone, MD,as directed by  Ronal JINNY Hailstone, MD while in the presence of Ronal JINNY Hailstone, MD.  I, Ronal JINNY Hailstone, MD, have reviewed all documentation for this visit. The documentation on 09/30/2024 for the exam, diagnosis, procedures, and orders are all accurate and complete.     "

## 2024-10-03 ENCOUNTER — Ambulatory Visit: Payer: Self-pay | Admitting: Internal Medicine

## 2024-10-03 LAB — URINE CULTURE
MICRO NUMBER:: 17410051
SPECIMEN QUALITY:: ADEQUATE

## 2025-07-03 ENCOUNTER — Ambulatory Visit: Payer: Self-pay | Admitting: Internal Medicine
